# Patient Record
Sex: Female | Born: 1960 | Race: White | Hispanic: No | Smoking: Former smoker
Health system: Southern US, Community
[De-identification: ages and names within clinical notes are randomized; demographics above are authoritative.]

## PROBLEM LIST (undated history)

## (undated) ENCOUNTER — Emergency Department (HOSPITAL_COMMUNITY): Admission: EM | Payer: BLUE CROSS/BLUE SHIELD | Source: Home / Self Care

## (undated) ENCOUNTER — Emergency Department (HOSPITAL_COMMUNITY): Admission: EM | Payer: BC Managed Care – PPO

## (undated) DIAGNOSIS — I639 Cerebral infarction, unspecified: Secondary | ICD-10-CM

## (undated) DIAGNOSIS — IMO0002 Reserved for concepts with insufficient information to code with codable children: Secondary | ICD-10-CM

## (undated) DIAGNOSIS — R32 Unspecified urinary incontinence: Secondary | ICD-10-CM

## (undated) DIAGNOSIS — Z8679 Personal history of other diseases of the circulatory system: Secondary | ICD-10-CM

## (undated) DIAGNOSIS — D649 Anemia, unspecified: Secondary | ICD-10-CM

## (undated) DIAGNOSIS — I10 Essential (primary) hypertension: Secondary | ICD-10-CM

## (undated) DIAGNOSIS — Z8709 Personal history of other diseases of the respiratory system: Secondary | ICD-10-CM

## (undated) DIAGNOSIS — I493 Ventricular premature depolarization: Secondary | ICD-10-CM

## (undated) DIAGNOSIS — E785 Hyperlipidemia, unspecified: Secondary | ICD-10-CM

## (undated) DIAGNOSIS — D369 Benign neoplasm, unspecified site: Secondary | ICD-10-CM

## (undated) DIAGNOSIS — J45909 Unspecified asthma, uncomplicated: Secondary | ICD-10-CM

## (undated) DIAGNOSIS — Z9889 Other specified postprocedural states: Secondary | ICD-10-CM

## (undated) DIAGNOSIS — R112 Nausea with vomiting, unspecified: Secondary | ICD-10-CM

## (undated) DIAGNOSIS — F419 Anxiety disorder, unspecified: Secondary | ICD-10-CM

## (undated) DIAGNOSIS — Z8616 Personal history of COVID-19: Secondary | ICD-10-CM

## (undated) DIAGNOSIS — N809 Endometriosis, unspecified: Secondary | ICD-10-CM

## (undated) DIAGNOSIS — Z5189 Encounter for other specified aftercare: Secondary | ICD-10-CM

## (undated) HISTORY — DX: Essential (primary) hypertension: I10

## (undated) HISTORY — DX: Personal history of COVID-19: Z86.16

## (undated) HISTORY — PX: BREAST SURGERY: SHX581

## (undated) HISTORY — DX: Hyperlipidemia, unspecified: E78.5

## (undated) HISTORY — DX: Ventricular premature depolarization: I49.3

## (undated) HISTORY — DX: Personal history of other diseases of the respiratory system: Z87.09

## (undated) HISTORY — PX: HERNIA REPAIR: SHX51

## (undated) HISTORY — DX: Reserved for concepts with insufficient information to code with codable children: IMO0002

## (undated) HISTORY — DX: Endometriosis, unspecified: N80.9

## (undated) HISTORY — DX: Cerebral infarction, unspecified: I63.9

## (undated) HISTORY — PX: OTHER SURGICAL HISTORY: SHX169

## (undated) HISTORY — PX: REDUCTION MAMMAPLASTY: SUR839

## (undated) HISTORY — DX: Encounter for other specified aftercare: Z51.89

## (undated) HISTORY — DX: Anemia, unspecified: D64.9

## (undated) HISTORY — DX: Unspecified urinary incontinence: R32

## (undated) HISTORY — DX: Benign neoplasm, unspecified site: D36.9

## (undated) HISTORY — PX: EPIDURAL BLOCK INJECTION: SHX1516

## (undated) HISTORY — PX: SPINE SURGERY: SHX786

## (undated) HISTORY — DX: Personal history of other diseases of the circulatory system: Z86.79

---

## 1998-07-13 ENCOUNTER — Inpatient Hospital Stay (HOSPITAL_COMMUNITY): Admission: AD | Admit: 1998-07-13 | Discharge: 1998-07-13 | Payer: Self-pay | Admitting: Obstetrics and Gynecology

## 1998-09-02 ENCOUNTER — Inpatient Hospital Stay (HOSPITAL_COMMUNITY): Admission: AD | Admit: 1998-09-02 | Discharge: 1998-09-05 | Payer: Self-pay | Admitting: *Deleted

## 1998-09-06 ENCOUNTER — Encounter (HOSPITAL_COMMUNITY): Admission: RE | Admit: 1998-09-06 | Discharge: 1998-10-20 | Payer: Self-pay | Admitting: Obstetrics and Gynecology

## 1998-10-14 ENCOUNTER — Other Ambulatory Visit: Admission: RE | Admit: 1998-10-14 | Discharge: 1998-10-14 | Payer: Self-pay | Admitting: Obstetrics and Gynecology

## 1999-01-24 DIAGNOSIS — Z5189 Encounter for other specified aftercare: Secondary | ICD-10-CM

## 1999-01-24 HISTORY — DX: Encounter for other specified aftercare: Z51.89

## 1999-02-13 ENCOUNTER — Ambulatory Visit (HOSPITAL_COMMUNITY): Admission: RE | Admit: 1999-02-13 | Discharge: 1999-02-13 | Payer: Self-pay | Admitting: Obstetrics and Gynecology

## 1999-02-13 ENCOUNTER — Encounter: Payer: Self-pay | Admitting: Obstetrics and Gynecology

## 1999-05-03 ENCOUNTER — Ambulatory Visit (HOSPITAL_COMMUNITY): Admission: RE | Admit: 1999-05-03 | Discharge: 1999-05-03 | Payer: Self-pay | Admitting: Gastroenterology

## 1999-10-10 ENCOUNTER — Other Ambulatory Visit: Admission: RE | Admit: 1999-10-10 | Discharge: 1999-10-10 | Payer: Self-pay | Admitting: Obstetrics and Gynecology

## 1999-12-25 HISTORY — PX: ABDOMINAL HYSTERECTOMY: SHX81

## 1999-12-27 ENCOUNTER — Encounter (INDEPENDENT_AMBULATORY_CARE_PROVIDER_SITE_OTHER): Payer: Self-pay

## 1999-12-27 ENCOUNTER — Inpatient Hospital Stay (HOSPITAL_COMMUNITY): Admission: RE | Admit: 1999-12-27 | Discharge: 2000-01-02 | Payer: Self-pay | Admitting: Obstetrics and Gynecology

## 1999-12-29 ENCOUNTER — Encounter: Payer: Self-pay | Admitting: Obstetrics and Gynecology

## 1999-12-30 ENCOUNTER — Encounter: Payer: Self-pay | Admitting: *Deleted

## 2000-08-28 ENCOUNTER — Ambulatory Visit (HOSPITAL_COMMUNITY): Admission: RE | Admit: 2000-08-28 | Discharge: 2000-08-28 | Payer: Self-pay | Admitting: Obstetrics and Gynecology

## 2000-08-28 ENCOUNTER — Encounter: Payer: Self-pay | Admitting: Obstetrics and Gynecology

## 2001-01-31 ENCOUNTER — Ambulatory Visit (HOSPITAL_COMMUNITY): Admission: RE | Admit: 2001-01-31 | Discharge: 2001-01-31 | Payer: Self-pay | Admitting: Gastroenterology

## 2001-12-24 HISTORY — PX: BLADDER SUSPENSION: SHX72

## 2002-12-22 ENCOUNTER — Ambulatory Visit (HOSPITAL_BASED_OUTPATIENT_CLINIC_OR_DEPARTMENT_OTHER): Admission: RE | Admit: 2002-12-22 | Discharge: 2002-12-22 | Payer: Self-pay | Admitting: Surgery

## 2003-02-24 ENCOUNTER — Other Ambulatory Visit: Admission: RE | Admit: 2003-02-24 | Discharge: 2003-02-24 | Payer: Self-pay | Admitting: Obstetrics and Gynecology

## 2003-08-25 ENCOUNTER — Ambulatory Visit (HOSPITAL_COMMUNITY): Admission: RE | Admit: 2003-08-25 | Discharge: 2003-08-25 | Payer: Self-pay | Admitting: Gastroenterology

## 2003-10-27 ENCOUNTER — Encounter: Admission: RE | Admit: 2003-10-27 | Discharge: 2003-10-27 | Payer: Self-pay | Admitting: Neurosurgery

## 2003-11-11 ENCOUNTER — Encounter: Admission: RE | Admit: 2003-11-11 | Discharge: 2003-11-11 | Payer: Self-pay | Admitting: Neurosurgery

## 2004-04-20 ENCOUNTER — Encounter: Admission: RE | Admit: 2004-04-20 | Discharge: 2004-04-20 | Payer: Self-pay | Admitting: Neurosurgery

## 2004-05-10 ENCOUNTER — Encounter: Admission: RE | Admit: 2004-05-10 | Discharge: 2004-05-10 | Payer: Self-pay | Admitting: Neurosurgery

## 2004-05-12 ENCOUNTER — Other Ambulatory Visit: Admission: RE | Admit: 2004-05-12 | Discharge: 2004-05-12 | Payer: Self-pay | Admitting: Obstetrics and Gynecology

## 2004-12-31 ENCOUNTER — Emergency Department (HOSPITAL_COMMUNITY): Admission: EM | Admit: 2004-12-31 | Discharge: 2004-12-31 | Payer: Self-pay | Admitting: Emergency Medicine

## 2005-02-19 ENCOUNTER — Ambulatory Visit (HOSPITAL_COMMUNITY): Admission: RE | Admit: 2005-02-19 | Discharge: 2005-02-19 | Payer: Self-pay | Admitting: General Surgery

## 2005-02-19 ENCOUNTER — Encounter (INDEPENDENT_AMBULATORY_CARE_PROVIDER_SITE_OTHER): Payer: Self-pay | Admitting: *Deleted

## 2005-02-20 ENCOUNTER — Observation Stay (HOSPITAL_COMMUNITY): Admission: RE | Admit: 2005-02-20 | Discharge: 2005-02-20 | Payer: Self-pay | Admitting: General Surgery

## 2005-09-07 ENCOUNTER — Encounter: Payer: Self-pay | Admitting: *Deleted

## 2005-10-24 ENCOUNTER — Encounter: Admission: RE | Admit: 2005-10-24 | Discharge: 2005-10-24 | Payer: Self-pay | Admitting: *Deleted

## 2008-01-29 ENCOUNTER — Encounter: Admission: RE | Admit: 2008-01-29 | Discharge: 2008-01-29 | Payer: Self-pay | Admitting: Surgery

## 2008-02-02 ENCOUNTER — Encounter: Admission: RE | Admit: 2008-02-02 | Discharge: 2008-02-02 | Payer: Self-pay | Admitting: Obstetrics and Gynecology

## 2008-03-19 ENCOUNTER — Encounter: Admission: RE | Admit: 2008-03-19 | Discharge: 2008-03-19 | Payer: Self-pay | Admitting: Neurosurgery

## 2008-03-25 ENCOUNTER — Encounter: Admission: RE | Admit: 2008-03-25 | Discharge: 2008-03-25 | Payer: Self-pay | Admitting: Neurosurgery

## 2008-04-20 ENCOUNTER — Encounter: Admission: RE | Admit: 2008-04-20 | Discharge: 2008-04-20 | Payer: Self-pay | Admitting: Neurosurgery

## 2008-05-06 ENCOUNTER — Encounter: Admission: RE | Admit: 2008-05-06 | Discharge: 2008-05-06 | Payer: Self-pay | Admitting: Neurosurgery

## 2008-06-10 ENCOUNTER — Ambulatory Visit (HOSPITAL_COMMUNITY): Admission: RE | Admit: 2008-06-10 | Discharge: 2008-06-10 | Payer: Self-pay | Admitting: Neurosurgery

## 2008-06-11 ENCOUNTER — Ambulatory Visit (HOSPITAL_COMMUNITY): Admission: RE | Admit: 2008-06-11 | Discharge: 2008-06-12 | Payer: Self-pay | Admitting: Neurosurgery

## 2008-09-09 ENCOUNTER — Encounter: Admission: RE | Admit: 2008-09-09 | Discharge: 2008-09-09 | Payer: Self-pay | Admitting: Neurosurgery

## 2008-09-29 ENCOUNTER — Inpatient Hospital Stay (HOSPITAL_COMMUNITY): Admission: RE | Admit: 2008-09-29 | Discharge: 2008-10-03 | Payer: Self-pay | Admitting: Neurosurgery

## 2008-12-24 HISTORY — PX: LUMBAR FUSION: SHX111

## 2009-01-28 ENCOUNTER — Encounter: Admission: RE | Admit: 2009-01-28 | Discharge: 2009-01-28 | Payer: Self-pay | Admitting: Neurosurgery

## 2009-01-31 ENCOUNTER — Encounter: Admission: RE | Admit: 2009-01-31 | Discharge: 2009-01-31 | Payer: Self-pay | Admitting: Obstetrics and Gynecology

## 2009-02-02 ENCOUNTER — Encounter: Admission: RE | Admit: 2009-02-02 | Discharge: 2009-02-02 | Payer: Self-pay | Admitting: Neurosurgery

## 2009-02-25 ENCOUNTER — Encounter: Admission: RE | Admit: 2009-02-25 | Discharge: 2009-02-25 | Payer: Self-pay | Admitting: Neurosurgery

## 2009-04-28 ENCOUNTER — Encounter: Admission: RE | Admit: 2009-04-28 | Discharge: 2009-04-28 | Payer: Self-pay | Admitting: Specialist

## 2010-02-20 ENCOUNTER — Encounter: Admission: RE | Admit: 2010-02-20 | Discharge: 2010-02-20 | Payer: Self-pay | Admitting: Neurosurgery

## 2010-02-20 ENCOUNTER — Encounter: Admission: RE | Admit: 2010-02-20 | Discharge: 2010-02-20 | Payer: Self-pay | Admitting: Obstetrics and Gynecology

## 2010-03-30 ENCOUNTER — Encounter: Admission: RE | Admit: 2010-03-30 | Discharge: 2010-03-30 | Payer: Self-pay | Admitting: Neurosurgery

## 2010-05-05 ENCOUNTER — Encounter: Admission: RE | Admit: 2010-05-05 | Discharge: 2010-05-05 | Payer: Self-pay | Admitting: Neurosurgery

## 2010-09-11 ENCOUNTER — Encounter: Admission: RE | Admit: 2010-09-11 | Discharge: 2010-09-11 | Payer: Self-pay | Admitting: Neurosurgery

## 2010-11-20 ENCOUNTER — Encounter: Admission: RE | Admit: 2010-11-20 | Discharge: 2010-11-20 | Payer: Self-pay | Admitting: Neurosurgery

## 2011-01-01 ENCOUNTER — Ambulatory Visit: Payer: Self-pay | Admitting: Cardiology

## 2011-01-13 ENCOUNTER — Encounter: Payer: Self-pay | Admitting: Neurosurgery

## 2011-01-14 ENCOUNTER — Encounter: Payer: Self-pay | Admitting: Neurosurgery

## 2011-01-25 ENCOUNTER — Other Ambulatory Visit (HOSPITAL_COMMUNITY): Payer: Self-pay | Admitting: Obstetrics and Gynecology

## 2011-01-25 DIAGNOSIS — Z1239 Encounter for other screening for malignant neoplasm of breast: Secondary | ICD-10-CM

## 2011-01-25 DIAGNOSIS — Z1231 Encounter for screening mammogram for malignant neoplasm of breast: Secondary | ICD-10-CM

## 2011-02-22 ENCOUNTER — Other Ambulatory Visit: Payer: Self-pay | Admitting: Obstetrics and Gynecology

## 2011-02-22 ENCOUNTER — Ambulatory Visit (HOSPITAL_COMMUNITY): Admission: RE | Admit: 2011-02-22 | Payer: BC Managed Care – PPO | Source: Ambulatory Visit

## 2011-02-22 ENCOUNTER — Ambulatory Visit
Admission: RE | Admit: 2011-02-22 | Discharge: 2011-02-22 | Disposition: A | Discharge status: Home / Self Care | Payer: BC Managed Care – PPO | Source: Ambulatory Visit | Attending: Obstetrics and Gynecology | Admitting: Obstetrics and Gynecology

## 2011-02-22 DIAGNOSIS — Z139 Encounter for screening, unspecified: Secondary | ICD-10-CM

## 2011-02-22 DIAGNOSIS — Z1231 Encounter for screening mammogram for malignant neoplasm of breast: Secondary | ICD-10-CM

## 2011-03-19 ENCOUNTER — Other Ambulatory Visit: Payer: Self-pay | Admitting: Otolaryngology

## 2011-03-19 DIAGNOSIS — H9202 Otalgia, left ear: Secondary | ICD-10-CM

## 2011-04-02 ENCOUNTER — Other Ambulatory Visit: Payer: Self-pay | Admitting: Neurosurgery

## 2011-04-02 DIAGNOSIS — M47816 Spondylosis without myelopathy or radiculopathy, lumbar region: Secondary | ICD-10-CM

## 2011-04-09 ENCOUNTER — Other Ambulatory Visit: Payer: Self-pay | Admitting: Neurosurgery

## 2011-04-09 ENCOUNTER — Ambulatory Visit
Admission: RE | Admit: 2011-04-09 | Discharge: 2011-04-09 | Disposition: A | Discharge status: Home / Self Care | Payer: BC Managed Care – PPO | Source: Ambulatory Visit | Attending: Neurosurgery | Admitting: Neurosurgery

## 2011-04-09 DIAGNOSIS — M47816 Spondylosis without myelopathy or radiculopathy, lumbar region: Secondary | ICD-10-CM

## 2011-05-08 NOTE — Op Note (Signed)
NAME:  [name], [name] NO.:  [id number]   MEDICAL RECORD NO.:  [id number]          PATIENT TYPE:  OIB   LOCATION:  3018                         FACILITY:  [facility]   PHYSICIAN:  Payton Doughty, M.D.      DATE OF BIRTH:  March 18, 1961   DATE OF PROCEDURE:  06/11/2008  DATE OF DISCHARGE:                               OPERATIVE REPORT   PREOPERATIVE DIAGNOSIS:  Foraminal disk on the left side, L5-S1.   POSTOPERATIVE DIAGNOSIS:  Foraminal disk on the left side, L5-S1.   PROCEDURE:  Left L5-S1 foraminal diskectomy.   SURGEON:  Payton Doughty, MD   ANESTHESIA:  General endotracheal.   PREPARATION:  Prepped and draped with alcohol wipe.   COMPLICATIONS:  None.   ASSISTANT:  Environmental consultant.   BODY OF TEXT:  A 50 year old girl with left L5 radiculopathy and a  foraminal disk on the left side at L5-S1 taken to operative smoothly  sized and intubated, placed prone on the operating table.  Pressure  points padded following shave, prepped and draped in usual sterile  fashion.  Skin was infiltrated with 1% lidocaine with 100,000  epinephrine.  The skin was incised over the lamina of L5.  The lamina of  L5 was dissected free in subperiosteal plane out to the lateral margin  of the facet joint.  Intraoperative x-ray confirmed correctness level  filling to the pars and the superior portion of the L5-S1 facet joint.  The lateral epidural space was accessed.  The L5 root was identified and  the dorsal ganglion identified.  Medial dissection revealed a fragmented  disk, which was compressing the dorsal root ganglion.  The ganglion  itself was quite indurated and red.  The disk fragment was removed  without difficulty and the disk space explored.  All graspable fragments  removed.  Wound was irrigated.  Hemostasis assured.  The nerve root  explored in all quadrants and found to be free.  It was covered with  Depo-Medrol soaked fat.  Successive layers of 0 Vicryl, 2-0 Vicryl, and  4-0 Vicryl  were used to close.  Benzoin and Steri-Strips were placed and  made occlusive Telfa and OpSite.  The patient returned to recovery room  in good condition.           ______________________________  Payton Doughty, M.D.     MWR/MEDQ  D:  06/11/2008  T:  06/12/2008  Job:  [id number]

## 2011-05-08 NOTE — H&P (Signed)
[name], [name] NO.:  [id number]   MEDICAL RECORD NO.:  [id number]          PATIENT TYPE:  INP   LOCATION:  2899                         FACILITY:  [facility]   PHYSICIAN:  Payton Doughty, M.D.      DATE OF BIRTH:  [birth year]   DATE OF ADMISSION:  09/29/2008  DATE OF DISCHARGE:                              HISTORY & PHYSICAL   ADMITTING DIAGNOSIS:  Herniated disk, L5-S1 in extreme lateral position.   BODY OF TEXT:  This is a now 50 year old right-handed white girl who had  a left-sided L5-S1 diskectomy in late spring, done very well and had the  sudden onset of left leg pain, it was different than before.  MR  demonstrates a disk in new position at the extreme lateral position on  the left side and she is now admitted for decompression and fusion.   MEDICAL HISTORY:  Remarkable for hypercholesterolemia and anxiety.  She  is on Prozac, antihypertensive, and Lyrica.   ALLERGIES:  She is allergic to PENICILLIN.   SURGICAL HISTORY:  Hysterectomy, which was complicated by some  postoperative hemorrhaging for which she had abdominal exploration.  She  has also had lumbar diskectomies noted before.   SOCIAL HISTORY:  She does not smoke.  Drinks on social basis and  participates in tennis.  She is a Futures trader.   FAMILY HISTORY:  Her mom is 77 and dad is 76, both are in good health.   REVIEW OF SYSTEMS:  GENERAL:  Remarkable for glasses, heart murmur,  hypercholesterolemia, leg pain, endometriosis, leg weakness, back pain,  and large transfusion associated with her hysterectomy.  HEENT:  Normal  limits.  NECK:  She has reasonable range of motion in her neck.  CHEST:  Clear.  CARDIAC:  1-2/6 systolic ejection murmur.  ABDOMEN:  Nontender.  No hepatosplenomegaly.  EXTREMITIES:  Without clubbing or cyanosis.  GU:  Deferred.  Peripheral pulses are good.  NEUROLOGIC:  She is awake,  alert, and oriented.  Cranial nerves are intact.  Motor exam shows 5/5  strength  throughout the upper and lower extremities.  Sensory  dysesthesias described in her left L5 distribution.  Reflexes are one at  the knees, one at the ankles.  Straight leg raise is extraordinarily  positive on the left side.   MR results have been reviewed above.   Consideration was given to an anterior approach, however, because of the  extensive scarring, her abdomen was not felt to be prudent.  The plan  therefore is for a laminectomy, diskectomy, posterior lumbar interbody  fusion with Ray threaded fusion cages and posterolateral arthrodesis.  The risks and benefits have been discussed with her and she wish to  proceed.           ______________________________  Payton Doughty, M.D.     MWR/MEDQ  D:  09/29/2008  T:  09/29/2008  Job:  [id number]

## 2011-05-08 NOTE — Op Note (Signed)
NAMEMarland Kitchen  [name], [name] NO.:  [id number]   MEDICAL RECORD NO.:  [id number]          PATIENT TYPE:  INP   LOCATION:  3041                         FACILITY:  [facility]   PHYSICIAN:  Payton Doughty, M.D.      DATE OF BIRTH:  1961-05-20   DATE OF PROCEDURE:  09/29/2008  DATE OF DISCHARGE:                               OPERATIVE REPORT   PREOPERATIVE DIAGNOSIS:  Extreme lateral disk at L5-S1.   POSTOPERATIVE DIAGNOSIS:  Extreme lateral disk at L5-S1.   OPERATIVE PROCEDURES:  1. L5-S1 laminectomy and diskectomy.  2. Posterior lumbar body fusion with Ray threaded fusion cages.  3. Posterolateral arthrodesis with BMP on Actifuse.   SURGEON:  Payton Doughty, MD   ANESTHESIA:  General endotracheal.   PREPARATION:  Prepped and draped with alcohol wipe.   COMPLICATIONS:  None.   ASSISTANT:  Cristi Loron, MD   BODY OF TEXT:  A 50 year old girl with recurrent disk in the extreme  lateral position on the left side at L5-S1, taken to the operating room  and smoothly anesthetized and intubated, placed prone on the operating  table.  Following shave, prep and drape in the usual sterile fashion,  the skin was incised from mid L4 to mid S1 and the lamina and transverse  process of L5-S1 were exposed bilaterally in subperiosteal plane.  Intraoperative x-ray confirmed correctness of the level.  The pars  interarticularis, lamina, and inferior facet of L5 and superior facet of  S1 were removed bilaterally using a high-speed drill.  This allowed  access to the L5-S1 disk space.  Disk space was opened on the left side  and diskectomy carried out.  Working then laterally through the annular  defect, the disk material that extruded laterally was pushed back into  the disk space and recovered through the dorsal annular incision.  This  allowed decompression of the L5 root in its lateral position.  Diskectomy was then carried out on the right side and the Ray cages 12 x  21 mm were  placed bilaterally at L5-S1.  Intraoperative x-ray showed  good placement of cages.  They were packed with bone graft harvested  from the facet joints and capped.  Transverse process and sacral ala  were decorticated with a high-speed drill and packed with BMP on the  Actifuse extender matrix.  Final films showed good placement of cages.  Successive layers of 0 Vicryl, 2-0 Vicryl and 3-0 nylon were used to  close.  Betadine and Telfa dressing was applied and the patient returned  to recovery room in good condition.    .           ______________________________  Payton Doughty, M.D.     MWR/MEDQ  D:  09/29/2008  T:  09/30/2008  Job:  [id number]

## 2011-05-08 NOTE — H&P (Signed)
NAME:  [name], [name] NO.:  [id number]   MEDICAL RECORD NO.:  [id number]          PATIENT TYPE:  OIB   LOCATION:  3018                         FACILITY:  [facility]   PHYSICIAN:  Payton Doughty, M.D.      DATE OF BIRTH:  29-Feb-1961   DATE OF ADMISSION:  06/11/2008  DATE OF DISCHARGE:                              HISTORY & PHYSICAL   ADMITTING DIAGNOSES:  1. Left L5-S1 foraminal herniated disk.  2. Left L5 radiculopathy.   This is a 50 year old right-handed white girl with a several month  history of severe left lower extremity pain.  She has had a round of  epidural steroids.  MRI showed a foraminal disk on the left side last  week and she had a marked increase in her pain and began having some  dorsiflexion weakness.  Her PMR shows persistent foraminal disk on the  left side at L5-S1 with compression left L5 root at the dorsal root  ganglion and she was admitted for diskectomy.   MEDICAL HISTORY:  Remarkable.  She has hypercholesterolemia, some  anxiety.   Uses Wellbutrin, Xanax, calcium, estradiol, and baby aspirin.  She has  had some hysterectomy and some postoperative hemorrhage that  necessitated abdominal exploration.   ALLERGIES:  She is allergic to PENICILLIN and preparation H.   SOCIAL HISTORY:  Does not smoke.  Drinks on social basis, participates  in tennis and running.  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   FAMILY HISTORY:  Mother is 45 and father is 46.  Both are in good  condition.  Father has hypertension.   REVIEW OF SYSTEMS:  Remarkable for glasses, heart murmur,  hypercholesterolemia, leg pain when she is walking, endometriosis, leg  weakness, back pain.   PHYSICAL EXAMINATION:  HEENT:  Normal limits.  NECK:  She has good range of motion.  CHEST: Clear.  CARDIAC:  1-2/6 systolic ejection murmur.  ABDOMEN:  Nontender.  No hepatosplenomegaly.  EXTREMITIES:  Without clubbing or cyanosis.  Peripheral pulses are good.  GU:   Deferred.  NEUROLOGIC:  She is awake, alert, and oriented.  Cranial nerves appear  functioning normally.  Motor exam shows 5/5 strength throughout the  upper and lower extremities save for dorsiflexion in the left foot, they  are 5-/5.  Ankle everters are also weak.  Sensory deficit in the left L5  distribution.  Reflexes are 2 at the knees, 2 at the right ankle, 1 at  the left.  Straight leg raise is extremely positive on the left.   MRI results have been reviewed above.   CLINICAL IMPRESSION:  Foraminal disk with a left L5 radiculopathy.   PLAN:  Foraminal diskectomy on the left side.  The risks and benefits  have been discussed, and she wishes to proceed.           ______________________________  Payton Doughty, M.D.     MWR/MEDQ  D:  06/11/2008  T:  06/12/2008  Job:  [id number]

## 2011-05-11 NOTE — Op Note (Signed)
NAME:  [name], [name]                   ACCOUNT NO.:  [id number]   MEDICAL RECORD NO.:  [id number]                   PATIENT TYPE:  AMB   LOCATION:  DSC                                  FACILITY:  [facility]   PHYSICIAN:  Thornton Park. Daphine Deutscher, M.D.             DATE OF BIRTH:  10/25/1961   DATE OF PROCEDURE:  12/22/2002  DATE OF DISCHARGE:                                 OPERATIVE REPORT   PREOPERATIVE DIAGNOSIS:  Umbilical hernia.   POSTOPERATIVE DIAGNOSIS:  Umbilical hernia.   OPERATION PERFORMED:  Vest over pants umbilical herniorrhaphy with Marlex  mesh.   SURGEON:  Thornton Park. Daphine Deutscher, M.D.   ANESTHESIA:  General by LMA.   INDICATIONS FOR PROCEDURE:  The patient is a 50 year old lady with a prior  history of laparoscopic intervention to the umbilical region multiple times  in the past.  Within the last two years, she has had a lower midline  incision and after that procedure was noted to have a bulge in the  umbilicus.  This could be seen to displace basically the bottom of the  umbilicus and was about 1 cm in diameter with Valsalva.   DESCRIPTION OF PROCEDURE:  In room 3 at [facility] the  patient was in supine position and was given some intravenous ciprofloxacin  as there is an allergy to penicillin.  The abdomen was prepped with Betadine  and draped sterilely.  Prior incisions included a longitudinal incision  going up into the umbilicus and there appeared to be multiple transverse  incisions and scarring.  An ellipse of skin was excised that incorporated  the bulk of this scar tissue.  I then dissected carefully using scissors  cephalad defining the margins medially or laterally around this hernia  defect.  I then began excising it and actually opened the hernia sac and  dissected around, excising the sac but leaving a little sac in the base of  the umbilicus to maintain viability of umbilical skin.  When this had been  completed, I was left with a  defect of just a little over a centimeter in  diameter.  I was unable to insert my finger.  I did lift it up and inspected  the superior and inferior margins and did not see any evidence of any bowel  that was tucked in it.  I did take some fat off of the hernia itself and  allowed that to return into the abdomen.  The defect appeared small and  there was no way that any of the Marlex  Gore-Tex mesh patches could be  inserted through this small defect.  A vest over pants repair was then  performed using a piece of Marlex mesh on the superior flap and using  horizontal mattress sutures coming through the superior fascia through the  mesh then superior fascia and inferior fascia and then back through the  inferior fascia and then backup through the superior  fascia and through  mesh.  Two such sutures were placed and then tied and this completely  obliterated this defect.  Seven throw square knots using 0 Prolene was used  and with the excess trimmed so that there was no prickly ends on the knot  and these were laid down laterally.  The mesh actually laid nicely but I  tacked it superiorly and inferiorly with a 5-0 Monocryl to keep it lying in  that fashion.  I did trim it back also so there was not excess mesh in this  area.  Inferiorly, I freed some of the scar to allow that skin to be a  little more mobile.  I tacked with the umbilical skin down to the fascia  with a single 5-0 Monocryl.  Closure of the wound was then effected after  injecting with Marcaine and irrigating was with 5-0 Monocryl subcuticular  sutures in an interrupted fashion.  Benzoin and Steri-Strips were applied on  the skin.  Half inch Steri-Strips were applied.  A cotton ball was placed  down in the umbilicus.  A sterile dressing was applied and then an abdominal  binder.  The patient will be given Vicodin to take as needed for pain and  will be followed up in the office in the next one to two weeks or as needed.     FINAL DIAGNOSIS:  Umbilical hernia, status post repair with mesh.                                                Thornton Park Daphine Deutscher, M.D.    MBM/MEDQ  D:  12/22/2002  T:  12/22/2002  Job:  [id number]   cc:   Velora Heckler, M.D.  [address] [place]  [place] [address]  Fax: [phone]

## 2011-05-11 NOTE — Op Note (Signed)
[facility]  Patient:    MARK BENECKE                 MRN: [id number] Proc. Date: 12/28/99 Adm. Date:  [id number] Attending:  Rhina Brackett                           Operative Report  PREOPERATIVE DIAGNOSIS:  Posthysterectomy bleeding.  POSTOPERATIVE DIAGNOSIS:  Posthysterectomy bleeding.  OPERATION:  Laparotomy, removal of clot and resuturing of vaginal cuff.  SURGEON:  Duke Salvia. Marcelle Overlie, M.D.  ANESTHESIA:  General endotracheal.  COMPLICATIONS:  None.  URINE OUTPUT:  150 cc, clear.  BLOOD REPLACEMENT:  Four units packed red blood cells intraoperatively.  DESCRIPTION OF PROCEDURE:  The patient was taken to the operating room with general endotracheal started.  She was in the process of receiving four units of packed red blood cells.  She was prepped and draped and was given Cefotan 1 gm IV and clear urine output as mentioned was noted preop.  After the incision was prepped and draped, the clips were removed.  The fascial suture was removed.  We carefully explored the subcutaneous layers, the perirectus muscle and subfascial areas showed no evidence of any bleeding.  On entering the peritoneum there was a large amount of clot noted.  Estimated blood loss was listed as 2000 cc including all clot.  Once the clot was removed, a retractor was positioned.  Irrigation was carried ut initially to allow for a better view.  After the retractor was positioned, the bowels were packed superiorly out of the field.  The clot had emanated from the  area of the cuff and once the clot was removed, all major pedicles were inspected carefully.  This was done in a systematic fashion starting at the utero-ovarian  pedicle, the right ovary appeared to be normal.  There was no bleeding from this pedicle all the way down the side wall on that side looked normal.  The left IP  ligament was carefully inspected, was hemostatic but a Kelly  clamp was placed and an additional free tie was placed around the left IP ligament to secure it further. The course of the ureter was retraced and was noted to be well out of the operative site as it was yesterday.  The pelvic sidewall showed no evidence of oozing. There was some oozing at the cuff but no brisk bleeding.  This area was irrigated and  inspected carefully since it was felt that the bleeding had come from the cuff area.  The entire cuff was resutured with interrupted 2-0 Monocryl sutures. These were held temporarily to allow for a better view of the bladder flap area which was irrigated and inspected and noted to be hemostatic.  Further, the space if Retzius was examined and noted to be hemostatic.  Copious irrigation was then carried out with the patient in reverse Trendelenburg position to suction the old blood and  clot from the gutters.  The patient was then further observed over the course of five to 10 minutes, looking at the cuff and there was no further bleeding noted. When this was noted to be hemostatic, all instruments were removed along with correct sponge count x 2 and instrument count prior to closure.  Monocryl 2-0 was used to approximate the rectus muscles, fascia closed from laterally to midline  with 0 PDS suture.  The subcutaneous area was hemostatic, was irrigated, again noted to  be hemostatic.  Clips and Steri-Strips were used on the skin.  She did  receive Cefotan 1 gm IV preoperatively and will be followed in AICU with labs obtained when she gets to the recovery room. DD:  12/28/99 TD:  12/29/99 Job: [id number]

## 2011-05-11 NOTE — Op Note (Signed)
NAMELANDI, [name]             ACCOUNT NO.:  [id number]   MEDICAL RECORD NO.:  [id number]          PATIENT TYPE:  AMB   LOCATION:  DAY                          FACILITY:  [facility]   PHYSICIAN:  Timothy E. Earlene Plater, M.D. DATE OF BIRTH:  Oct 13, 1961   DATE OF PROCEDURE:  02/19/2005  DATE OF DISCHARGE:                                 OPERATIVE REPORT   PREOPERATIVE DIAGNOSIS:  Postoperative bleeding.   POSTOPERATIVE DIAGNOSIS:  Postoperative bleeding.   OPERATION/PROCEDURE:  1.  Examination under anesthesia.  2.  Oversew hemorrhoidal area for postoperative bleeding.   SURGEON:  Timothy E. Earlene Plater, M.D.   ANESTHESIA:  General, Dr. Quentin Cornwall. Den[place].   INDICATIONS:  Mrs. 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  here at [facility].  She made good progress through the recovery room in  the day hospital and was discharged home.  At a period at home, she was able  to take liquids, a bit of food and was able to void her urine and was  feeling what we would expect normal for postoperative until like around  between 6 and 7 p.m. when she had rectal pain and fullness.  She went to the  bathroom and voided some old blood with a few clots.  She then went to the  bathroom two more times with both old and new blood.  I have talked to her  husband, Dr. Darnell Level, and I suggested that we bring her immediately to  the hospital for examination.  He agreed.  She was delivered to the  emergency room and jointly taken to the holding area of the operating room.  Dr. Council Mechanic took over her resuscitation though she stable, alert and vital  signs were quite good.  Blood was drawn.  Permit was signed.   DESCRIPTION OF PROCEDURE:  She was taken to the operating room.  General  endotracheal anesthesia administered and her vital signs remained stable and  normal.  The blood was done and immediately returned showing a  normal  hemoglobin, protime and PTT.  She was gently placed in the lithotomy  position.  The perianal layer was inspected.  There was some old blood on  the buttocks area and there was old blood in the bed clothing which had been  taken off.  The area was prepped and draped in the usual fashion.  I  inserted the proctoscope, suctioned away the old blood and gently advanced  the proctoscope beyond the PPH stapled anastomosis until all the old blood  was suctioned away and irrigant was clear.  I then removed the proctoscope  and placed the standard operating anoscope and inspected the distal rectum  entirely with good lighting and good vision.  There was one small area of  oozing in the right posterior stapled anastomosis.  This was oversewn with  two individual sutures of 4-0 Vicryl.  This was well controlled.  Observed  this area for a full 20 minutes, exchanging clean, dry 4 x 4 gauzes every  five  minutes and no further areas were bleeding.  Her vital signs remained  good.  Her blood pressure remained good.  She was never hypotensive.  After  the 20-minute observation with no further findings, I placed a roll of  Gelfoam inside of Surgicel and placed that in the rectum.  The perianal area  was cleansed.  The vulvar area was  inspected.  There was no bleeding there.  A Foley catheter was placed and  that urine was clear.  She tolerated this well and remained stable.  She was  awakened and taken to the recovery room.  Full report was given to her  husband, Dr. Gerrit Friends.  She will be admitted for observation.      TED/MEDQ  D:  02/19/2005  T:  02/20/2005  Job:  [id number]

## 2011-05-11 NOTE — Op Note (Signed)
NAME:  [name], [name]                   ACCOUNT NO.:  [id number]   MEDICAL RECORD NO.:  [id number]                   PATIENT TYPE:  AMB   LOCATION:  ENDO                                 FACILITY:  [facility]   PHYSICIAN:  John C. Madilyn Fireman, M.D.                 DATE OF BIRTH:  October 06, 1961   DATE OF PROCEDURE:  08/25/2003  DATE OF DISCHARGE:                                 OPERATIVE REPORT   PROCEDURE PERFORMED:  Esophagogastroduodenoscopy with biopsy.   ENDOSCOPIST:  Barrie Folk, M.D.   INDICATIONS FOR PROCEDURE:  The patient is a 50 year old white female who  presents with a three to four week history of regurgitant sensation as well  as mild odynophagia and belching, somewhat atypical for reflux symptoms she  has experienced in the past and unrelieved by a one week course of proton  pump inhibitor.   DESCRIPTION OF PROCEDURE:  The patient was placed in the left lateral  decubitus position and placed on the pulse monitor with continuous low-flow  oxygen delivered by nasal cannula.  She was sedated with 100 mg of IV  Demerol and 10 mg of IV Versed.  The Olympus video endoscope was advanced  under direct vision into the oropharynx esophagus.  The esophagus was  straight and of normal caliber with the squamocolumnar line at 38 cm.  There  was a possible very small sliding hiatal hernia no more than 1 cm in length.  The squamocolumnar line was sharp with no erosions and no ring, stricture or  esophageal ulcer seen.  The proximal and middle esophagus likewise appeared  normal.  The stomach was entered and a small amount of liquid secretions was  suctioned from the fundus.  A retroflex view of the cardia was unremarkable.  The fundus, body, antrum and pylorus all appeared normal.  The duodenum was  entered and both the bulb and second portion were well inspected and  appeared to be within normal limits.  The scope was then withdrawn back into  the stomach and a CLO test obtained.  The  scope was then withdrawn and the  patient was returned to the recovery room in stable condition.  The patient  tolerated the procedure well.  There were no immediate complications.   IMPRESSION:  Possible minimal sliding hiatal hernia, otherwise normal study.    PLAN:  Await CLO test and treat for eradication of Helicobacter pylori if  positive, otherwise will try a prolonged course of proton pump inhibitor at  b.i.d. dosing as well as institute dietary and lifestyle modifications for  gastroesophageal reflux.                                               John C. Madilyn Fireman, M.D.    JCH/MEDQ  D:  08/25/2003  T:  08/25/2003  Job:  [id number]   cc:   Mosetta Putt, M.D.  [address] [place]  [place] [address]  Fax: [phone]   Velora Heckler, M.D.  [address] [place]  [place] [address]  Fax: [phone]

## 2011-05-11 NOTE — Discharge Summary (Signed)
NAMEMarland Kitchen  NIRVI, BOEHLER NO.:  [id number]   MEDICAL RECORD NO.:  [id number]          PATIENT TYPE:  INP   LOCATION:  3041                         FACILITY:  [facility]   PHYSICIAN:  Payton Doughty, M.D.      DATE OF BIRTH:  [birth year]   DATE OF ADMISSION:  09/29/2008  DATE OF DISCHARGE:  10/03/2008                               DISCHARGE SUMMARY   ADMITTING DIAGNOSIS:  Recurrent herniated disk, lateral position L5-S1.   DISCHARGE DIAGNOSIS:  Recurrent herniated disk, lateral position L5-S1.   OPERATIVE PROCEDURE:  L5-S1 diskectomy and fusion.   COMPLICATION:  None.   DISCHARGE STATUS:  Alive and well.   BODY OF TEXT:  This is a 50 year old right-handed white girl whose  history and physical is recounted in the chart.  She basically had a L5-  S1 disk done before, had a recurrence of pain.  MR showed a far-lateral  recurrence of her disk at L5-S1.  She was admitted for decompression and  fusion.  General exam was intact.  Neurologic exam was intact with a  left S1 radiculopathy.  She was taken to operating room, smoothly  anesthetized and intubated and underwent diskectomy and fusion.  Postoperatively, she is well, was up and about the first postoperative  day, had one fever, which resolved.  Second postoperative day, she was  also walk around, blood pressure was okay.  She was discharged home on  the third postoperative day with diminished leg pain and modest  incisional back pain.  She went home on Percocet.  Followup will be in  the [facility] office in a week for sutures.           ______________________________  Payton Doughty, M.D.     MWR/MEDQ  D:  12/07/2008  T:  12/07/2008  Job:  [id number]

## 2011-05-11 NOTE — Op Note (Signed)
[facility]  Patient:    [name]                 MRN: [id number] Proc. Date: 12/27/99 Adm. Date:  [id number] Attending:  Rhina Brackett                           Operative Report  PREOPERATIVE DIAGNOSIS:       Chronic pelvic pain, endometriosis, abnormal uterine bleeding.  POSTOPERATIVE DIAGNOSIS:      Chronic pelvic pain, endometriosis, abnormal uterine bleeding.  OPERATION:                    Total abdominal hysterectomy, left salpingo-oophorectomy.  SURGEON:                      Duke Salvia. Marcelle Overlie, M.D.  ASSISTANTWilley Blade, M.D.  ANESTHESIA:                   General endotracheal.  COMPLICATIONS:                None.  DRAINS:                       Foley catheter.  ESTIMATED BLOOD LOSS:         150 cc.  DESCRIPTION OF PROCEDURE:     The patient was taken to the operating room after an adequate level of general endotracheal was obtained with the patient in the supine position.  The abdomen was prepared and draped in the usual manner for sterile abdominal procedures.  The vagina prepped separately with Betadine and a Foley catheter in position.  Pfannenstiel incision was made into the old scar, which as carried down into the fascia, incised and extended transversely.  Rectus muscles divided in the midline.  The peritoneum entered superiorly without incident and  extended in a vertical manner.  The upper abdomen was explored and known to be palpably normal.  The appendix was small, very benign appearing and was not removed.  OConnor-OSullivan retractor was positioned.  The patient was placed in Trendelenburg, balanced packs superiorly out of the field.  The pelvic findings are as follows:  Uterus is normal size.  The right tube and  ovary were normal.  The cul-de-sac was free and clear.  The left tube and ovary had some active areas of endometriosis and some filmy and moderately  dense adhesions between the left ovary, tube, pelvic, and the pelvic side wall and colon. These were lysed in avascular plane.  Long Kelly clamps were then placed at each utero-ovarian pedicle.  The left round ligament was clamped, divided and suture  ligated with 0 Dexon.  The retroperitoneal space on that side was then developed. The course of the ureter was then traced, the left infundibulopelvic ligament was then isolated, clamped, divided, first free tied followed by suture ligature of 0 Monocryl.  This was done after assuring that the course of the ureter was well below.  The right round ligament was then clamped, divided, and suture ligated nd the right uteroovarian pedicle was clamped, divided, first free tied followed by suture ligature of Dexon, thus conserving the right tube.  The peritoneum was then carried around in midline.  The bladder was then advanced inferiorly with sharp  and blunt dissection below the cervical vaginal ______.  The uterine vasculature, pedicle then skeletonized on either side.  Care was taken to keep the course of the left pelvic ureter, in particular, under view until it was dissected laterally way from the uterus.  There was no trauma to the ureter.  The uterine and vasculature pedicles were then clamped, divided and suture ligated with 0 Monocryl.  In sequential fashion, staying close to the uterus and assuring that the bladder was well below the cardinal ligament, the uterosacral ligament, then cervical vaginal pedicles were clamped, divided and suture ligated with 0 Monocryl.  The specimen was excised and closed in a running lock 2-0 Monocryl suture.  The pelvis was irrigated with saline and aspirated.  All major pedicles were inspected and noted to be hemostatic.  The course of the ureter on either side was retraced and noted to be well below the operative site.  Prior to closure, sponge, needle and instrument counts reported  as correct x 2.  The rectus muscle was reapproximated with 3-0 Monocryl running suture.  The fascia closed from laterally to midline either side with 0 PDS running suture.  The subcutaneous fat was undermined and noted to be hemostatic after electrocautery and clips used on the skin with Steri-Strips and a pressure dressing.  She tolerated this well and went to the recovery room in good condition. Marcaine 100%, 15 cc was used to infiltrate the incision prior to the dressing.  DD:  12/27/99 TD:  12/27/99 Job: [id number]

## 2011-05-11 NOTE — Discharge Summary (Signed)
[facility]  Patient:    [name]                 MRN: [id number] Adm. Date:  [id number] Disc. Date: 01/02/00 Attending:  Rhina Brackett                           Discharge Summary  DISCHARGE DIAGNOSES:          1. Severe pelvic pain.                               2. Dysmenorrhea.                               3. Menorrhagia.                               4. Secondary to endometriosis.                               5. Total abdominal hysterectomy and left                                  salpingo-oophorectomy this admission.                               6. Posthysterectomy bleeding requiring transfusion                                  and reexploration with resuturing of the vaginal                                  cuff.  SUMMARY:                      Please see the admission H&P for details. Briefly, a 50 year old, gravida 4, para 3, with severe pelvic pain secondary to endometriosis and additionally menorrhagia that has been unresponsive to conservative measures, presents for a definitive hysterectomy.  HOSPITAL COURSE:              On January 3, under general anesthesia, the patient underwent TAHLSO with an estimated blood loss of 150 cc without complications. At 5 p.m. the same day, she had requested removal of her Foley due to bladder spasms which was done.  The physician on call was notified at 0300 of the first postoperative day stating that the nurse had observed that her abdomen was suddenly blue.  Her hemoglobin was checked at that point and was noted to be 5.8.  It was felt that she had some bleeding inside the abdominal wall perhaps a subfascial hematoma and another hemoglobin was ordered at 0600.  By this time, her hemoglobin was 5.  She had some diaphragmatic irritation, increased respirations, and was ery anxious at that point.  The decision was made to reexplore due to the drop in hemoglobin in this  patient who had peritoneal irritation symptoms.  She was retaken to the operating room while blood was  ordered.  She was given four units of packed red blood cells intraoperatively.  A large amount of clot was removed.  No active bleeders could be seen, but there was a small amount of oozing from the cuff. ll of the rest of the pedicles were hemostatic.  The area of the rectus muscles and subfascially was dry.  The vaginal cuff was resutured.  The clot was removed. he received four units intraoperatively.  She was recovered in AICU where her urine output remained good.  Her postoperative hemoglobin was 10.6.  On the first postoperative day, she was afebrile and her vital signs were stable.  Platelet count was 103,000, and hemoglobin was 10.  Due to some continued occasional desaturation on her pulse oximeter, chest x-ray was done which showed basilar atelectasis.  Incentive spirometry was emphasized.  On January 7, postoperative day #4, she was tolerating a regular diet, her hemoglobin was 9.8.  She had some bruising of the abdominal wall, but no evidence of cellulitis.  By January 8, postoperative day #5, she was ambulating without difficulty, ahd established normal bowel and urinary function.  She had a mild episode of diarrhea responding to Immodium and by January 9, postoperative day #6, she was ready for discharge with the clips removed.  LABORATORY DATA:              Admission UA was negative.  Preoperative hemoglobin was 12.9, postoperative immediate 10.6.  The remainder of the values listed as above.  At the time of discharge was 9.8, that was dated January 6.  Chest x-ray, as mentioned, mild left basilar atelectasis.  Pathology report is still pending.  DISPOSITION:                  The patient is discharged on Hemocyte one p.o. q.d., Tylox p.r.n. pain.  The clips have been removed.  She will return to the office in one week.  Advised to report any incisional redness  or drainage, increased pain or bleeding, or fever over 101.  She was given specific instructions regarding diet, sex, and exercise.  Again, she will return to our office in one week.  Activity  graded increase. DD:  01/01/00 TD:  01/02/00 Job: [id number]

## 2011-05-11 NOTE — Op Note (Signed)
NAMEJENEL, [name]             ACCOUNT NO.:  [id number]   MEDICAL RECORD NO.:  [id number]          PATIENT TYPE:  AMB   LOCATION:  DAY                          FACILITY:  [facility]    PHYSICIAN:  Timothy E. Earlene Plater, M.D. DATE OF BIRTH:  Apr 20, 1961   DATE OF PROCEDURE:  02/19/2005  DATE OF DISCHARGE:                                 OPERATIVE REPORT   PREOPERATIVE DIAGNOSIS:  Mucosal prolapse syndrome.   POSTOPERATIVE DIAGNOSIS:  Mucosal prolapse syndrome.   OPERATION/PROCEDURE:  Procedure for prolapsed hemorrhoids.   SURGEON:  Timothy E. Earlene Plater, M.D.   ANESTHESIA:  General.  [name] is 20; otherwise healthy.  She has had previous surgeries for  signs and symptoms of pelvic relaxation.  She has complaints of mucosal  prolapse syndrome with bleeding, soilage and protrusion of mucosa from the  rectum.  She denies constipation or difficulty moving the stool.  Symptoms  have increased over the past six months.  After careful discussion, she  wishes to proceed with the PPH procedure.  This has been carefully  explained.  She is seen, identified and permit signed.  She is seen and  evaluated by anesthesia.   DESCRIPTION OF PROCEDURE:  The patient is taken to the operating room and  placed supine.  LMA anesthesia provided.  She is carefully placed in the  lithotomy position.  Perianal area inspected, prepped and draped in the  usual fashion.  Minimal external swelling is noted at the anal orifice.  The  anus is lubricated, gently dilated.  Anoscopy carried out showing redundant  mucosal folds in the distal rectum.  The anus is injected round and about  with Marcaine, epinephrine and Wydase for wide field block.  Again the anus  is further dilated and the operating anoscope placed.  At approximately 4 cm  above the dentate line, a circumferential pursestring suture of 2-0 Prolene  is placed.  It is checked, feels secure.  The clear anoscope with the  Ethicon kit is then inserted.  The  suture ends are pulled through the  center.  With this properly placed, the  PPH stapling device wide open is  then inserted through the anoscope above the pursestring suture.  Again it  is checked.  The pursestring is pulled snugly and tied around the shaft of  the stapling device.  Then with proper traction, pressure and angulation,  the PPH stapler is carefully closed, held for 60 seconds, fired, held for 60  seconds, and then removed.  The complete circle of tissue is examined and  found to be intact and satisfactory.  Then the staple line is checked  repeatedly over the next 10 minutes.  One tiny area, left lateral, shows  some oozing.  This is suture ligated with 4-0 Vicryl.  The resulting staple  line is approximately 2.5 to 3 cm above the dentate line, completely  circumferentially.  No complications were noted and with this the procedure  is complete.  Gelfoam, gauze and dry sterile dressing applied.  She  tolerated it well.  Counts correct.  She was removed to recovery room in  good  condition.   Written and verbal instructions given to her and her husband, including  Percocet 5 mg #36 and she will be followed as an outpatient.      TED/MEDQ  D:  02/19/2005  T:  02/19/2005  Job:  [id number]

## 2011-05-11 NOTE — Procedures (Signed)
[facility]  Patient:    [name], [name]                MRN: [id number] Proc. Date: 01/31/01 Adm. Date:  [id number] Disc. Date: [id number] Attending:  Nelda Marseille CC:         Velora Heckler, M.D.   Procedure Report  PROCEDURE:  Esophagoscopy.  INDICATION:  Patient with odynophagia since eating chicken, thinking possibly a bone was stuck in her throat.  It seems to be mostly in her neck.  Consent was signed after risks, benefits, methods, and options thoroughly discussed with [name] in the past and before this procedure, as well as with her husband in the past.  MEDICINES USED:  Demerol 70 mg, Versed 7 mg.  DESCRIPTION OF PROCEDURE:  Using the new 160 upper video endoscope, it was inserted by direct vision.  A quick look at the posterior pharynx, including the vocal cords, appeared grossly normal.  We did easily advance into the esophagus and on slow advancement to the GE junction, no obvious abnormality was seen.  We withdrew back slowly through the esophagus and right at the upper esophageal sphincter, which seemed to be where she was most tender and did not like having the scope in that area, there seemed to be a very slight, shallow, small tear, which due to motility was difficult to photograph, but on a few occasions we were able to have a good visualization of it.  No obvious food particles, bone, etc., were seen.  We did withdraw back to to the posterior pharynx.  A quick look there again did not reveal any abnormality. We did reinsert the scope into the esophagus one more time and advanced to about 30 cm, which again did not reveal any additional findings, and again on the slow withdrawal confirmed the above findings.  No food particles, bone, etc., were seen.  The scope was removed.  The patient tolerated the procedure well.  There was no obvious immediate complications.  ENDOSCOPIC DIAGNOSIS: 1. Esophagoscopy only.   Believe a linear tear right at the upper esophageal    sphincter, which was small and shallow. 2. Otherwise normal esophagus.  PLAN:  Offered viscous lidocaine swish and swallow, but she has Hurricaine spray at home, which will be find.  Watch diet, avoiding salads, nuts, seeds, popcorn, etc., and if symptoms continue would probably ask an ear, nose, and throat doctor to see her early next week just to make sure.  They know to call me this weekend as needed. DD:  01/31/01 TD:  02/03/01 Job: [id number]

## 2011-08-16 ENCOUNTER — Other Ambulatory Visit: Payer: Self-pay | Admitting: Neurosurgery

## 2011-08-16 DIAGNOSIS — M47816 Spondylosis without myelopathy or radiculopathy, lumbar region: Secondary | ICD-10-CM

## 2011-08-20 ENCOUNTER — Ambulatory Visit
Admission: RE | Admit: 2011-08-20 | Discharge: 2011-08-20 | Disposition: A | Discharge status: Home / Self Care | Payer: BC Managed Care – PPO | Source: Ambulatory Visit | Attending: Neurosurgery | Admitting: Neurosurgery

## 2011-08-20 ENCOUNTER — Other Ambulatory Visit: Payer: Self-pay | Admitting: Neurosurgery

## 2011-08-20 DIAGNOSIS — M47816 Spondylosis without myelopathy or radiculopathy, lumbar region: Secondary | ICD-10-CM

## 2011-08-20 MED ORDER — IOHEXOL 180 MG/ML  SOLN
2.0000 mL | Freq: Once | INTRAMUSCULAR | Status: AC | PRN
  Administered 2011-08-20: 2 mL via INTRA_ARTICULAR

## 2011-08-20 MED ORDER — METHYLPREDNISOLONE ACETATE 40 MG/ML INJ SUSP (RADIOLOG
120.0000 mg | Freq: Once | INTRAMUSCULAR | Status: AC
  Administered 2011-08-20: 120 mg via INTRA_ARTICULAR

## 2011-09-20 LAB — COMPREHENSIVE METABOLIC PANEL
ALT: 45 — ABNORMAL HIGH
AST: 93 — ABNORMAL HIGH
Albumin: 3.8
Alkaline Phosphatase: 48
BUN: 14
CO2: 33 — ABNORMAL HIGH
Calcium: 9.3
Chloride: 101
Creatinine, Ser: 0.7
GFR calc Af Amer: >60
GFR calc non Af Amer: >60
Glucose, Bld: 106 — ABNORMAL HIGH
Potassium: 3.1 — ABNORMAL LOW
Sodium: 141
Total Bilirubin: 0.6
Total Protein: 6.4

## 2011-09-20 LAB — CBC
HCT: 32 — ABNORMAL LOW
Hemoglobin: 11.1 — ABNORMAL LOW
MCHC: 34.7
MCV: 102 — ABNORMAL HIGH
Platelets: 249
RBC: 3.14 — ABNORMAL LOW
RDW: 14.8
WBC: 6.9

## 2011-09-20 LAB — APTT: aPTT: 26

## 2011-09-20 LAB — PROTIME-INR
INR: 0.9
Prothrombin Time: 12.1

## 2011-09-21 ENCOUNTER — Other Ambulatory Visit (HOSPITAL_COMMUNITY): Payer: Self-pay | Admitting: Neurological Surgery

## 2011-09-21 DIAGNOSIS — M5416 Radiculopathy, lumbar region: Secondary | ICD-10-CM

## 2011-09-24 ENCOUNTER — Ambulatory Visit (HOSPITAL_COMMUNITY)
Admission: RE | Admit: 2011-09-24 | Discharge: 2011-09-24 | Disposition: A | Discharge status: Home / Self Care | Payer: BC Managed Care – PPO | Source: Ambulatory Visit | Attending: Neurological Surgery | Admitting: Neurological Surgery

## 2011-09-24 DIAGNOSIS — M5137 Other intervertebral disc degeneration, lumbosacral region: Secondary | ICD-10-CM | POA: Insufficient documentation

## 2011-09-24 DIAGNOSIS — M51379 Other intervertebral disc degeneration, lumbosacral region without mention of lumbar back pain or lower extremity pain: Secondary | ICD-10-CM | POA: Insufficient documentation

## 2011-09-24 DIAGNOSIS — M5416 Radiculopathy, lumbar region: Secondary | ICD-10-CM

## 2011-09-24 LAB — URINALYSIS, ROUTINE W REFLEX MICROSCOPIC
Bilirubin Urine: NEGATIVE
Glucose, UA: NEGATIVE
Hgb urine dipstick: NEGATIVE
Ketones, ur: NEGATIVE
Nitrite: NEGATIVE
Protein, ur: NEGATIVE
Specific Gravity, Urine: 1.028
Urobilinogen, UA: 0.2
pH: 5

## 2011-09-24 LAB — CBC
HCT: 24.8 — ABNORMAL LOW
HCT: 34.5 — ABNORMAL LOW
Hemoglobin: 11.9 — ABNORMAL LOW
Hemoglobin: 8.4 — ABNORMAL LOW
MCHC: 34
MCHC: 34.4
MCV: 98.5
MCV: 98.7
Platelets: 165
Platelets: 228
RBC: 2.52 — ABNORMAL LOW
RBC: 3.5 — ABNORMAL LOW
RDW: 12.4
RDW: 12.9
WBC: 5.7
WBC: 6

## 2011-09-24 LAB — COMPREHENSIVE METABOLIC PANEL
ALT: 18
AST: 18
Albumin: 3.7
Alkaline Phosphatase: 50
BUN: 10
CO2: 32
Calcium: 8.8
Chloride: 98
Creatinine, Ser: 0.64
GFR calc Af Amer: >60
GFR calc non Af Amer: >60
Glucose, Bld: 80
Potassium: 3.6
Sodium: 136
Total Bilirubin: 0.5
Total Protein: 6.5

## 2011-09-24 LAB — DIFFERENTIAL
Basophils Absolute: 0
Basophils Relative: 1
Eosinophils Absolute: 0.1
Eosinophils Relative: 1
Lymphocytes Relative: 15
Lymphs Abs: 0.8
Monocytes Absolute: 0.4
Monocytes Relative: 7
Neutro Abs: 4.4
Neutrophils Relative %: 77

## 2011-09-24 LAB — PROTIME-INR
INR: 0.9
Prothrombin Time: 12.4

## 2011-09-24 LAB — APTT: aPTT: 28

## 2011-09-24 MED ORDER — IOHEXOL 180 MG/ML  SOLN
14.0000 mL | Freq: Once | INTRAMUSCULAR | Status: AC | PRN
  Administered 2011-09-24: 14 mL via INTRATHECAL

## 2011-10-22 ENCOUNTER — Encounter (HOSPITAL_COMMUNITY)
Admission: RE | Admit: 2011-10-22 | Discharge: 2011-10-22 | Disposition: A | Discharge status: Home / Self Care | Payer: BC Managed Care – PPO | Source: Ambulatory Visit | Attending: Neurological Surgery | Admitting: Neurological Surgery

## 2011-10-22 ENCOUNTER — Ambulatory Visit (HOSPITAL_COMMUNITY)
Admission: RE | Admit: 2011-10-22 | Discharge: 2011-10-22 | Disposition: A | Discharge status: Home / Self Care | Payer: BC Managed Care – PPO | Source: Ambulatory Visit | Attending: Neurological Surgery | Admitting: Neurological Surgery

## 2011-10-22 ENCOUNTER — Other Ambulatory Visit (HOSPITAL_COMMUNITY): Payer: Self-pay | Admitting: Neurological Surgery

## 2011-10-22 DIAGNOSIS — Z01810 Encounter for preprocedural cardiovascular examination: Secondary | ICD-10-CM | POA: Insufficient documentation

## 2011-10-22 DIAGNOSIS — Z981 Arthrodesis status: Secondary | ICD-10-CM

## 2011-10-22 DIAGNOSIS — Z01818 Encounter for other preprocedural examination: Secondary | ICD-10-CM | POA: Insufficient documentation

## 2011-10-22 DIAGNOSIS — Z01812 Encounter for preprocedural laboratory examination: Secondary | ICD-10-CM | POA: Insufficient documentation

## 2011-10-22 LAB — BASIC METABOLIC PANEL
BUN: 21 mg/dL (ref 6–23)
CO2: 28 mEq/L (ref 19–32)
Calcium: 10.1 mg/dL (ref 8.4–10.5)
Chloride: 97 mEq/L (ref 96–112)
Creatinine, Ser: 0.86 mg/dL (ref 0.50–1.10)
GFR calc Af Amer: 89 mL/min — ABNORMAL LOW (ref >90)
GFR calc non Af Amer: 77 mL/min — ABNORMAL LOW (ref >90)
Glucose, Bld: 84 mg/dL (ref 70–99)
Potassium: 3.9 mEq/L (ref 3.5–5.1)
Sodium: 137 mEq/L (ref 135–145)

## 2011-10-22 LAB — SURGICAL PCR SCREEN
MRSA, PCR: NEGATIVE
Staphylococcus aureus: NEGATIVE

## 2011-10-22 LAB — TYPE AND SCREEN
ABO/RH(D): O POS
Antibody Screen: NEGATIVE

## 2011-10-22 LAB — CBC
HCT: 36.6 % (ref 36.0–46.0)
Hemoglobin: 12.5 g/dL (ref 12.0–15.0)
MCH: 33.3 pg (ref 26.0–34.0)
MCHC: 34.2 g/dL (ref 30.0–36.0)
MCV: 97.6 fL (ref 78.0–100.0)
Platelets: 259 10*3/uL (ref 150–400)
RBC: 3.75 MIL/uL — ABNORMAL LOW (ref 3.87–5.11)
RDW: 12.1 % (ref 11.5–15.5)
WBC: 8.9 10*3/uL (ref 4.0–10.5)

## 2011-10-22 LAB — ABO/RH: ABO/RH(D): O POS

## 2011-10-25 ENCOUNTER — Inpatient Hospital Stay (HOSPITAL_COMMUNITY): Payer: BC Managed Care – PPO

## 2011-10-25 ENCOUNTER — Inpatient Hospital Stay (HOSPITAL_COMMUNITY)
Admission: RE | Admit: 2011-10-25 | Discharge: 2011-10-28 | DRG: 756 | Disposition: A | Discharge status: Home / Self Care | Payer: BC Managed Care – PPO | Source: Ambulatory Visit | Attending: Neurological Surgery | Admitting: Neurological Surgery

## 2011-10-25 DIAGNOSIS — M47817 Spondylosis without myelopathy or radiculopathy, lumbosacral region: Principal | ICD-10-CM | POA: Diagnosis present

## 2011-10-25 DIAGNOSIS — I10 Essential (primary) hypertension: Secondary | ICD-10-CM | POA: Diagnosis present

## 2011-10-25 DIAGNOSIS — F3289 Other specified depressive episodes: Secondary | ICD-10-CM | POA: Diagnosis present

## 2011-10-25 DIAGNOSIS — F411 Generalized anxiety disorder: Secondary | ICD-10-CM | POA: Diagnosis present

## 2011-10-25 DIAGNOSIS — M5126 Other intervertebral disc displacement, lumbar region: Secondary | ICD-10-CM | POA: Diagnosis present

## 2011-10-25 DIAGNOSIS — Z87891 Personal history of nicotine dependence: Secondary | ICD-10-CM

## 2011-10-25 DIAGNOSIS — F329 Major depressive disorder, single episode, unspecified: Secondary | ICD-10-CM | POA: Diagnosis present

## 2011-10-26 MED ORDER — ACETAMINOPHEN 325 MG PO TABS: 650.0000 mg | ORAL_TABLET | ORAL | Status: DC | PRN

## 2011-10-26 MED ORDER — METHOCARBAMOL 500 MG PO TABS: 500.0000 mg | ORAL_TABLET | Freq: Four times a day (QID) | ORAL | Status: DC | PRN

## 2011-10-26 MED ORDER — DOCUSATE SODIUM 100 MG PO CAPS
100.0000 mg | ORAL_CAPSULE | Freq: Two times a day (BID) | ORAL | Status: DC
  Administered 2011-10-28: 100 mg via ORAL

## 2011-10-26 MED ORDER — ONDANSETRON HCL 4 MG/2ML IJ SOLN: 4.0000 mg | INTRAMUSCULAR | Status: DC | PRN

## 2011-10-26 MED ORDER — ALUM & MAG HYDROXIDE-SIMETH 200-200-20 MG/5ML PO SUSP: 30.0000 mL | Freq: Four times a day (QID) | ORAL | Status: DC | PRN

## 2011-10-26 MED ORDER — METHOCARBAMOL 750 MG PO TABS
750.0000 mg | ORAL_TABLET | Freq: Four times a day (QID) | ORAL | Status: DC
  Administered 2011-10-28 (×2): 750 mg via ORAL
  Filled 2011-10-26 (×11): qty 1

## 2011-10-26 MED ORDER — SODIUM CHLORIDE 0.9 % IJ SOLN: 3.0000 mL | Freq: Two times a day (BID) | INTRAMUSCULAR | Status: DC

## 2011-10-26 MED ORDER — METHOCARBAMOL 100 MG/ML IJ SOLN
750.0000 mg | Freq: Four times a day (QID) | INTRAVENOUS | Status: DC | PRN
  Filled 2011-10-26: qty 7.5

## 2011-10-26 MED ORDER — SENNA 8.6 MG PO TABS
1.0000 | ORAL_TABLET | Freq: Every evening | ORAL | Status: DC | PRN
  Administered 2011-10-28: 8.6 mg via ORAL
  Filled 2011-10-26: qty 1

## 2011-10-26 MED ORDER — ACETAMINOPHEN 650 MG RE SUPP: 650.0000 mg | RECTAL | Status: DC | PRN

## 2011-10-26 MED ORDER — METHOCARBAMOL 100 MG/ML IJ SOLN
500.0000 mg | Freq: Four times a day (QID) | INTRAVENOUS | Status: DC | PRN
  Filled 2011-10-26: qty 5

## 2011-10-26 MED ORDER — ALPRAZOLAM 0.5 MG PO TABS: 0.5000 mg | ORAL_TABLET | Freq: Two times a day (BID) | ORAL | Status: DC | PRN

## 2011-10-26 MED ORDER — METHOCARBAMOL 500 MG PO TABS: 750.0000 mg | ORAL_TABLET | Freq: Four times a day (QID) | ORAL | Status: DC | PRN

## 2011-10-26 MED ORDER — DIAZEPAM 5 MG PO TABS: 5.0000 mg | ORAL_TABLET | Freq: Four times a day (QID) | ORAL | Status: DC | PRN

## 2011-10-26 MED ORDER — PHENOL 1.4 % MT LIQD: 1.0000 | OROMUCOSAL | Status: DC | PRN

## 2011-10-26 MED ORDER — OXYCODONE-ACETAMINOPHEN 5-325 MG PO TABS
1.0000 | ORAL_TABLET | ORAL | Status: DC | PRN
  Administered 2011-10-28: 3 via ORAL
  Administered 2011-10-28: 1 via ORAL
  Administered 2011-10-28: 2 via ORAL

## 2011-10-26 MED ORDER — MENTHOL 3 MG MT LOZG: 1.0000 | LOZENGE | OROMUCOSAL | Status: DC | PRN

## 2011-10-26 MED ORDER — POTASSIUM CHLORIDE IN NACL 20-0.9 MEQ/L-% IV SOLN
INTRAVENOUS | Status: DC
  Filled 2011-10-26 (×4): qty 1000

## 2011-10-26 NOTE — Op Note (Signed)
[name], [name] NO.:  [id number]  MEDICAL RECORD NO.:  [id number]  LOCATION:  3029                         FACILITY:  [facility]  PHYSICIAN:  Stefani Dama, M.D.  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  PROCEDURES:  Bilateral laminotomy and foraminotomy, decompression of L4 and L5 nerve roots with dissection greater than required for simple posterior lumbar interbody fusion, posterior lumbar interbody fusion with PEEK spacers, local autograft and allograft, nonsegmental fixation at L4-L5 with pedicle screws, posterolateral arthrodesis with allograft.  SURGEON:  Dr. Barnett Abu.  FIRST ASSISTANT:  Coletta Memos, MD  ANESTHESIA:  General endotracheal.  INDICATION:  [name] is a 50 year old individual who has had significant back and right lower extremity pain.  She has had previous degenerative changes at L5-S1 and required surgical decompression arthrodesis using a Ray cage technique a number of years ago.  She has had some chronic back pain but more recently has developed a severe and unrelenting right lumbar radiculopathy and a myelogram demonstrated she had a foraminal compression of the L4 nerve root in addition to compression of L5 nerve root at L4-L5.  She is advised regarding the need for surgical stabilization of this process with decompression of the L4 and L5 nerve roots.  PROCEDURE:  The patient was brought to the operating room, supine on the stretcher.  After smooth induction of general endotracheal anesthesia, she was turned prone.  Back was prepped with alcohol and DuraPrep and draped in a sterile fashion.  An elliptical incision was made around a previous scar and this area was  excised.  Dissection was carried down to the lumbodorsal fascia, was opened on either side of midline to expose the spinous processes of L3 and the most superior aspect of L4 and L5. Dissection was then carefully undertaken at L5 and the L5 spinous process was then grabbed with a towel clip and moved about to see if any motion could be instilled between L5 and S1.  None could be instilled suggesting that there was indeed solid arthrodesis from L5-S1. Dissection was then carried out laterally to expose the L4-L5 facet joint and the dissection was carried cephalad to expose the pars of L4. The self-retaining retractor was placed in the wound and laminotomies were then created removing the inferior margin lamina out through the medial wall of the facet and entire facetectomy of the inferior facet of L4 was carried out.  This was done bilaterally.  The yellow ligament was taken out and the interlaminar space was then explored.  The common dural tube was then decompressed and path of the L4 nerve root superiorly was identified and soft tissue and redundant yellow ligamentous tissue was cleared from this area.  L5 nerve root was cleared inferiorly.  Centrally, the disk space was identified and there was noted to be a significant bulge centrally of the disk at L4-L5. Epidural veins in this area were then cauterized and divided.  The dissection was  carried out in a similar fashion on the opposite side, and a laminotomy was created removing the inferior margin lamina of L4 and complete facet joint.  The L4 and L5 nerve roots were similarly fully decompressed.  Then, a diskectomy was carried out on each side removing the entirety of the disk at the L4-L5 space scraping the endplates and decorticating them completely so as to allow for placement of interbody spacers.  Self-retaining disk spreading tool was used on one side while the opposite side was further cleaned once all the disk had been  removed, and the endplates were prepared appropriately, 12-mm PEEK spacers were placed into the interbody space along with autograft from the laminotomies and Vitoss-soaked bone sponge that was covered with PureGen.  The interbody space was then fitted with 2 PEEK spacers, one on the left, one on the right, and the interbody space was packed with bone graft material.  Once this was completed, attention was turned to the pedicle entry sites at L4 which was done through the pars region using the medial to lateral technique.  The 5.5 x 35-mm screws were placed in this fashion.  At L5, 6.5 x 40-mm screws were placed in a conventional fashion.  Care was taken to make sure that the pads of the L4 and L5 nerve roots were well protected.  Lateral gutters were then decorticated and once a short precontoured 40-mm rods were placed to hold the screws together, a bit of compression was induced into this construct.  The lateral gutters were then packed with allograft. Hemostasis in the soft tissues was obtained meticulously as the L4 and L5 nerve roots were each sounded out and found to be free and clear. With the bone graft being packed in the lateral gutters and hemostasis achieved, lumbodorsal fascia was then closed with #1 Vicryl in interrupted fashion 2-0 Vicryl in the subcutaneous tissues, and 3-0 Vicryl subcuticularly.  Prior to final layer of closure, I instilled 20 mL of 0.5% Marcaine into the paraspinous fascia on either side.  Blood loss for the procedure was estimated about 300 mL.  The patient tolerated the procedure well and was returned to recovery in stable condition.     Stefani Dama, M.D.     [name]  D:  10/25/2011  T:  10/26/2011  Job:  [id number]  Electronically Signed by Barnett Abu M.D. on 10/26/2011 05:50:27 AM

## 2011-10-28 MED ORDER — OXYCODONE HCL 5 MG PO TABS
10.0000 mg | ORAL_TABLET | ORAL | Status: DC | PRN
  Administered 2011-10-28 (×3): 10 mg via ORAL
  Filled 2011-10-28 (×2): qty 2

## 2011-10-28 MED ORDER — OXYCODONE HCL 10 MG PO TB12: 10.0000 mg | ORAL_TABLET | ORAL | Status: DC | PRN

## 2011-10-28 NOTE — Discharge Summary (Signed)
  [name], [name] NO.:  [id number]  MEDICAL RECORD NO.:  [id number]  LOCATION:  3029                         FACILITY:  [facility]  PHYSICIAN:  Stefani Dama, M.D.  DATE OF BIRTH:  [birth year]  DATE OF ADMISSION:  10/25/2011 DATE OF DISCHARGE:  10/28/2011                              DISCHARGE SUMMARY   ADMITTING DIAGNOSES:  Herniated nucleus pulposus plus spondylosis at L4- L5 with lumbar radiculopathy L5 right, status post arthrodesis at L5-S1 with Ray cage fusion.  DISCHARGED AND FINAL DIAGNOSES:  Herniated nucleus pulposus and spondylosis at L4-L5 with lumbar radiculopathy L5 right, status post arthrodesis at L5-S1 with Ray cage arthrodesis.  CONDITION ON DISCHARGE:  Improved.  HOSPITAL COURSE:  [name] is a 50 year old right-handed individual who has had significant problems with back and leg pain.  She has had previous decompression at L5-S1 and had a Ray cage fusion a number of years ago.  She has had persistence of low back pain, but over the past month, she has developed ever worsening right lumbar radiculopathy found to be secondary to a large subligamentous herniation of the disk at L4- L5 with degenerative spondylitic changes at that level.  Having failed efforts at conservative management, she was advised regarding the need for surgical decompression which was performed at L4-L5.  She tolerated posterior lumbar interbody arthrodesis with PEEK spacers at L4-L5 and supplemental fixation with pedicle screws.  She was ambulated on the first postoperative day, and subsequently has improved her ambulation to where she is functioning independently.  DISCHARGE MEDICATIONS: 1. She was discharged home with a prescription for Percocet 10/325 one     up to every 4 hours as needed for pain. 2. She is also given a prescription for Robaxin 750 mg to use every 6     hours as needed for muscle spasm.  DISCHARGE INSTRUCTIONS:  She will be seen in the office  in 3 weeks' time for followup.  No postoperative labs were performed.  Preoperative laboratories including a CBC demonstrates hemoglobin of 13 g with hematocrit of 36.  Electrolytes were normal.  She will be seen in the office in 3 weeks' time for further followup.     Stefani Dama, M.D.     [name]  D:  10/27/2011  T:  10/27/2011  Job:  [id number]  Electronically Signed by Barnett Abu M.D. on 10/28/2011 10:21:37 PM

## 2011-10-28 NOTE — Progress Notes (Signed)
Physical Therapy Treatment Patient Details Name: [name] MRN: [id number] DOB: Nov 22, 1961 Today's Date: 10/28/2011  PT Assessment/Plan  PT - Assessment/Plan Comments on Treatment Session: Patient reports independent with getting in/out of bed.  Did well with stairs.  Ready for discharge from PT standpoint. PT Plan: All goals met and education completed, patient dischaged from PT services PT Frequency: Min 5X/week Follow Up Recommendations: None Equipment Recommended: None recommended by PT PT Goals  Acute Rehab PT Goals PT Goal Formulation: With patient Time For Goal Achievement: 5 days Pt will Roll Supine to Right Side: with modified independence PT Goal: Rolling Supine to Right Side - Progress: Met Pt will Roll Supine to Left Side: with modified independence PT Goal: Rolling Supine to Left Side - Progress: Met Pt will go Supine/Side to Sit: with modified independence PT Goal: Supine/Side to Sit - Progress: Met Pt will Transfer Sit to Stand/Stand to Sit: with modified independence PT Transfer Goal: Sit to Stand/Stand to Sit - Progress: Met Pt will Transfer Bed to Chair/Chair to Bed: with modified independence PT Transfer Goal: Bed to Chair/Chair to Bed - Progress: Met Pt will Ambulate: >150 feet;with modified independence;with least restrictive assistive device PT Goal: Ambulate - Progress: Met Pt will Go Up / Down Stairs: 3-5 stairs;with rail(s);with modified independence PT Goal: Up/Down Stairs - Progress: Partly met (Pt required supervision due to slightly decreased balance.) Additional Goals Additional Goal #1: Independently report 3/3 back precautions. PT Goal: Additional Goal #1 - Progress: Met  PT Treatment Precautions/Restrictions  Precautions Precautions: Back Precaution Comments: Patient able to recall 3/3 back precautions. Required Braces or Orthoses: Yes Spinal Brace: Applied in sitting position;Thoracolumbosacral orthotic Mobility (including Balance) Bed  Mobility Bed Mobility: No Transfers Transfers: Yes Sit to Stand: 7: Independent;With armrests;From chair/3-in-1;From toilet Stand to Sit: 7: Independent;With armrests;To chair/3-in-1;To toilet Ambulation/Gait Ambulation/Gait: Yes Ambulation/Gait Assistance: 6: Modified independent (Device/Increase time) Ambulation Distance (Feet): 220 Feet Assistive device: None Gait Pattern: Decreased step length - right;Decreased step length - left;Step-through pattern Stairs: Yes Stairs Assistance: 5: Supervision Stairs Assistance Details (indicate cue type and reason): Visual, tactile, and verbal cues to use safe, correct technique. Stair Management Technique: One rail Left;Sideways Number of Stairs: 5     Exercise    End of Session PT - End of Session Equipment Utilized During Treatment: Gait belt;Back brace Activity Tolerance: Patient tolerated treatment well Patient left: in chair Nurse Communication: Other (comment) (Patient ready for discharge from PT standpoint.) General Behavior During Session: [facility] for tasks performed Cognition: [facility] for tasks performed 8:55-9:20 Luna Glasgow)  Vena Austria 10/28/2011, 9:40 AM

## 2011-10-28 NOTE — Progress Notes (Signed)
Pt discharged in stable condition home with all belongings and family. Medications and discharge instructions reviewed prior to discharge. 

## 2011-10-28 NOTE — Discharge Summary (Signed)
Physician Discharge Summary  Patient ID: [name] MRN: [id number] DOB/AGE: Apr 16, 1961 50 y.o.  Admit date: 10/25/2011 Discharge date: 10/28/2011  Admission Diagnoses:  Discharge Diagnoses:  Active Problems:  * No active hospital problems. *    Discharged Condition: good  Hospital Course: Uneventful post op course PSF lumbar spine  Consults: none  Significant Diagnostic Studies: labs: none  Treatments: surgery: Posterior spinal fusion L45  Discharge Exam: Blood pressure 111/59, pulse 84, temperature 97.9 F (36.6 C), temperature source Oral, resp. rate 20, height 5\' 5"  (1.651 m), weight 65.2 kg (143 lb 11.8 oz), SpO2 98.00%. VSS, afebrile, no new neuro deficits, wound clean and dry, No signs of infection.  Disposition: Home    Current Discharge Medication List    CONTINUE these medications which have NOT CHANGED   Details  ALPRAZolam (XANAX) 0.5 MG tablet Take 0.5 mg by mouth 2 (two) times daily as needed. For anxiety     estradiol (ESTRACE) 1 MG tablet Take 1 mg by mouth every morning.      FLUoxetine (PROZAC) 10 MG capsule Take 30 mg by mouth every morning.      levofloxacin (LEVAQUIN) 500 MG tablet Take 500 mg by mouth daily.      losartan (COZAAR) 100 MG tablet Take 100 mg by mouth every morning.      oxyCODONE-acetaminophen (PERCOCET) 5-325 MG per tablet Take 1 tablet by mouth every 6 (six) hours as needed. For pain    simvastatin (ZOCOR) 20 MG tablet Take 20 mg by mouth every morning.      triamterene-hydrochlorothiazide (MAXZIDE-25) 37.5-25 MG per tablet Take 1 tablet by mouth every morning.        STOP taking these medications     albuterol (PROVENTIL) (2.5 MG/3ML) 0.083% nebulizer solution          Signed: Suzan Slick 10/28/2011, 4:16 PM

## 2011-12-25 HISTORY — PX: AUGMENTATION MAMMAPLASTY: SUR837

## 2012-02-06 ENCOUNTER — Ambulatory Visit (INDEPENDENT_AMBULATORY_CARE_PROVIDER_SITE_OTHER): Payer: BC Managed Care – PPO | Admitting: Family Medicine

## 2012-02-06 VITALS — BP 122/79 | HR 85 | Temp 98.2°F | Resp 16 | Ht 64.0 in | Wt 144.8 lb

## 2012-02-06 DIAGNOSIS — F32A Depression, unspecified: Secondary | ICD-10-CM

## 2012-02-06 DIAGNOSIS — F3289 Other specified depressive episodes: Secondary | ICD-10-CM

## 2012-02-06 DIAGNOSIS — N959 Unspecified menopausal and perimenopausal disorder: Secondary | ICD-10-CM

## 2012-02-06 DIAGNOSIS — R05 Cough: Secondary | ICD-10-CM

## 2012-02-06 DIAGNOSIS — E785 Hyperlipidemia, unspecified: Secondary | ICD-10-CM

## 2012-02-06 DIAGNOSIS — J329 Chronic sinusitis, unspecified: Secondary | ICD-10-CM

## 2012-02-06 DIAGNOSIS — F329 Major depressive disorder, single episode, unspecified: Secondary | ICD-10-CM

## 2012-02-06 DIAGNOSIS — I10 Essential (primary) hypertension: Secondary | ICD-10-CM

## 2012-02-06 DIAGNOSIS — R059 Cough, unspecified: Secondary | ICD-10-CM

## 2012-02-06 LAB — POCT CBC
Granulocyte percent: 76.3 %G (ref 37–80)
HCT, POC: 36.6 % — AB (ref 37.7–47.9)
Hemoglobin: 11.4 g/dL — AB (ref 12.2–16.2)
Lymph, poc: 0.7 (ref 0.6–3.4)
MCH, POC: 30.3 pg (ref 27–31.2)
MCHC: 31.1 g/dL — AB (ref 31.8–35.4)
MCV: 97.3 fL — AB (ref 80–97)
MID (cbc): 0.6 (ref 0–0.9)
MPV: 9.2 fL (ref 0–99.8)
POC Granulocyte: 4.3 (ref 2–6.9)
POC LYMPH PERCENT: 13 %L (ref 10–50)
POC MID %: 10.7 %M (ref 0–12)
Platelet Count, POC: 260 10*3/uL (ref 142–424)
RBC: 3.76 M/uL — AB (ref 4.04–5.48)
RDW, POC: 13.5 %
WBC: 5.7 10*3/uL (ref 4.6–10.2)

## 2012-02-06 MED ORDER — LEVOFLOXACIN 500 MG PO TABS: 500.0000 mg | ORAL_TABLET | Freq: Every day | ORAL | Status: AC

## 2012-02-06 MED ORDER — BENZONATATE 100 MG PO CAPS: 100.0000 mg | ORAL_CAPSULE | Freq: Two times a day (BID) | ORAL | Status: AC | PRN

## 2012-02-06 NOTE — Progress Notes (Signed)
  Subjective:    Patient ID: [name], female    DOB: 27-Mar-1961, 51 y.o.   MRN: [id number]  HPI  Patient presents with a 2-3 day history of a cough productive of green-bloody mucus. Cough is worse at night, at times to the point she is unable to breath deeply.  Facial congestion ST Myalgias  Denies fever or chills  Recovery room nurse  PMH/ Spinal fusion 11/12(Dr. Elsner)  Review of Systems  Constitutional: Positive for fatigue.  HENT: Positive for congestion and postnasal drip. Negative for ear pain.   Respiratory: Positive for cough. Negative for shortness of breath and wheezing.        Objective:   Physical Exam  Constitutional: She appears well-developed and well-nourished.  HENT:  Head: Normocephalic and atraumatic.  Right Ear: External ear normal.  Left Ear: External ear normal.  Mouth/Throat: Oropharyngeal exudate (pnd) present.  Cardiovascular: Normal rate, regular rhythm and normal heart sounds.   Pulmonary/Chest: Effort normal.    Lymphadenopathy:    Cervical adenopathy: shoddy anterior nodes.  Neurological: She is alert.     Results for orders placed in visit on 02/06/12  POCT CBC      Component Value Range   WBC 5.7  4.6 - 10.2 (K/uL)   Lymph, poc 0.7  0.6 - 3.4    POC LYMPH PERCENT 13.0  10 - 50 (%L)   MID (cbc) 0.6  0 - 0.9    POC MID % 10.7  0 - 12 (%M)   POC Granulocyte 4.3  2 - 6.9    Granulocyte percent 76.3  37 - 80 (%G)   RBC 3.76 (*) 4.04 - 5.48 (M/uL)   Hemoglobin 11.4 (*) 12.2 - 16.2 (g/dL)   HCT, POC 78.2 (*) 95.6 - 47.9 (%)   MCV 97.3 (*) 80 - 97 (fL)   MCH, POC 30.3  27 - 31.2 (pg)   MCHC 31.1 (*) 31.8 - 35.4 (g/dL)   RDW, POC 21.3     Platelet Count, POC 260  142 - 424 (K/uL)   MPV 9.2  0 - 99.8 (fL)        Assessment & Plan:   1. Cough  POCT CBC  2. Sinusitis    3. RAD (reactive airway disease)    4. HTN (hypertension)    5. Depression    6. Dyslipidemia    7. Menopausal and perimenopausal disorder      Levoquin 500 mg X 10 days Tessalon Perles [facility] Anticipatory guidance Work note

## 2012-02-07 DIAGNOSIS — N959 Unspecified menopausal and perimenopausal disorder: Secondary | ICD-10-CM | POA: Insufficient documentation

## 2012-02-07 DIAGNOSIS — F32A Depression, unspecified: Secondary | ICD-10-CM | POA: Insufficient documentation

## 2012-02-07 DIAGNOSIS — E785 Hyperlipidemia, unspecified: Secondary | ICD-10-CM | POA: Insufficient documentation

## 2012-02-07 DIAGNOSIS — I10 Essential (primary) hypertension: Secondary | ICD-10-CM | POA: Insufficient documentation

## 2012-02-07 DIAGNOSIS — F329 Major depressive disorder, single episode, unspecified: Secondary | ICD-10-CM | POA: Insufficient documentation

## 2012-02-11 ENCOUNTER — Other Ambulatory Visit: Payer: Self-pay | Admitting: Family Medicine

## 2012-02-11 ENCOUNTER — Ambulatory Visit
Admission: RE | Admit: 2012-02-11 | Discharge: 2012-02-11 | Disposition: A | Discharge status: Home / Self Care | Payer: BC Managed Care – PPO | Source: Ambulatory Visit | Attending: Family Medicine | Admitting: Family Medicine

## 2012-02-11 DIAGNOSIS — R05 Cough: Secondary | ICD-10-CM

## 2012-02-11 DIAGNOSIS — R059 Cough, unspecified: Secondary | ICD-10-CM

## 2012-02-25 ENCOUNTER — Other Ambulatory Visit: Payer: Self-pay | Admitting: Obstetrics and Gynecology

## 2012-02-25 DIAGNOSIS — Z1231 Encounter for screening mammogram for malignant neoplasm of breast: Secondary | ICD-10-CM

## 2012-03-11 ENCOUNTER — Ambulatory Visit
Admission: RE | Admit: 2012-03-11 | Discharge: 2012-03-11 | Disposition: A | Discharge status: Home / Self Care | Payer: BC Managed Care – PPO | Source: Ambulatory Visit | Attending: Obstetrics and Gynecology | Admitting: Obstetrics and Gynecology

## 2012-03-11 DIAGNOSIS — Z1231 Encounter for screening mammogram for malignant neoplasm of breast: Secondary | ICD-10-CM

## 2012-04-04 ENCOUNTER — Encounter: Payer: Self-pay | Admitting: *Deleted

## 2012-05-20 ENCOUNTER — Other Ambulatory Visit: Payer: Self-pay | Admitting: Plastic Surgery

## 2013-04-27 ENCOUNTER — Telehealth: Payer: Self-pay | Admitting: *Deleted

## 2013-04-27 ENCOUNTER — Ambulatory Visit (INDEPENDENT_AMBULATORY_CARE_PROVIDER_SITE_OTHER): Payer: BC Managed Care – PPO | Admitting: Obstetrics and Gynecology

## 2013-04-27 ENCOUNTER — Encounter: Payer: Self-pay | Admitting: Obstetrics and Gynecology

## 2013-04-27 VITALS — BP 120/78 | Ht 64.0 in | Wt 152.0 lb

## 2013-04-27 DIAGNOSIS — Z0000 Encounter for general adult medical examination without abnormal findings: Secondary | ICD-10-CM

## 2013-04-27 DIAGNOSIS — Z1231 Encounter for screening mammogram for malignant neoplasm of breast: Secondary | ICD-10-CM

## 2013-04-27 DIAGNOSIS — Z01419 Encounter for gynecological examination (general) (routine) without abnormal findings: Secondary | ICD-10-CM

## 2013-04-27 DIAGNOSIS — Z113 Encounter for screening for infections with a predominantly sexual mode of transmission: Secondary | ICD-10-CM

## 2013-04-27 LAB — POCT URINALYSIS DIPSTICK
Bilirubin, UA: NEGATIVE
Blood, UA: NEGATIVE
Glucose, UA: NEGATIVE
Ketones, UA: NEGATIVE
Leukocytes, UA: NEGATIVE
Nitrite, UA: NEGATIVE
Protein, UA: NEGATIVE
Urobilinogen, UA: NEGATIVE
pH, UA: 5

## 2013-04-27 LAB — STD PANEL
HIV: NONREACTIVE
Hepatitis B Surface Ag: NEGATIVE

## 2013-04-27 LAB — HEPATITIS C ANTIBODY: HCV Ab: NEGATIVE

## 2013-04-27 MED ORDER — ESTRADIOL 1 MG PO TABS: 1.0000 mg | ORAL_TABLET | ORAL | Status: DC

## 2013-04-27 NOTE — Telephone Encounter (Signed)
Message copied by Elnora Morrison on Mon Apr 27, 2013  4:34 PM ------      Message from: Conley Simmonds      Created: Mon Apr 27, 2013 11:23 AM      Regarding: Referral to Dr. Sharin Mons,            Please refer this patient to Dr. Terri Piedra for rash on chest.  I am thinking patient may need a biopsy.  Patient said any time is fine.            I cannot put an order in for ambulatory dermatology referral.....does not seem to exist.            Thanks,            Conley Simmonds ------

## 2013-04-27 NOTE — Telephone Encounter (Signed)
appointment made for patient at Dr. Terri Piedra office , May 7th,2014 @ 2:30pm. Notes and demographics faxed. patient notified of this appointment. sue

## 2013-04-27 NOTE — Progress Notes (Signed)
Patient ID: [name], female   DOB: Feb 20, 1961, 52 y.o.   MRN: [id number] 52 y.o.  Single  Caucasian female   [phone] here for annual exam.   Divorced.  Dating. Wants to continue estradiol. Some hemorrhoids.  Has fecal incontinence if eats the wrong thing.   Still with leaking bladder.  Does not want intervention.  Has had multiple prior pelvic surgeries.   Status post mastopexy about 6 months ago. Has persistent rash on skin of chest wall.  Never goes away.  No LMP recorded. Patient has had a hysterectomy.          Sexually active: yes  The current method of family planning is status post hysterectomy.    Exercising:tennis Last mammogram: 01/2012   Last pap smear: 18 months ago:wnl History of abnormal pap: Yes:20years JYN:WGNFAOZHYQ/MVHQIONGEXB:MWUX reverted back to normal. Smoking: no Alcohol:1 glass of wine per day Last colonoscopy: 01/2012 hx polyps:Dr. Medoff:next colonoscopy 01/2017 Last Bone Density:  3-4 years ago:wnl Last tetanus shot: up to date Last cholesterol check: yearly with Dr. Mosetta Putt  Hgb: 11.9               Urine: Neg    Health Maintenance  Topic Date Due  . Pap Smear  07/28/1978  . Tetanus/tdap  07/29/1979  . Colonoscopy  07/28/2010  . Influenza Vaccine  08/24/2013  . Mammogram  03/11/2014    Family History  Problem Relation Age of Onset  . Hypertension Mother   . Hypertension Father   . Hyperlipidemia Sister   . Hypertension Sister   . Hyperlipidemia Brother   . Hypertension Brother   . Cancer Maternal Uncle   . Breast cancer Maternal Grandmother   . Cancer Maternal Grandmother   . Hypertension Brother   . Hypertension Brother     Patient Active Problem List   Diagnosis Date Noted  . HTN (hypertension) 02/07/2012  . Depression 02/07/2012  . Dyslipidemia 02/07/2012  . Menopausal and perimenopausal disorder 02/07/2012    Past Medical History  Diagnosis Date  . Hypertension   . Hyperlipidemia   . PVC (premature ventricular  contraction)   . Anemia   . Endometriosis   . Urinary incontinence     Past Surgical History  Procedure Laterality Date  . Lumbar fusion  2010    Dr. Channing Mutters  . Hernia repair    . Abdominal hysterectomy  2001    TAH/RSO  . Bladder suspension  2003    LSO and rectocele repair at [facility]    Allergies: Penicillins  Current Outpatient Prescriptions  Medication Sig Dispense Refill  . ALPRAZolam (XANAX) 0.5 MG tablet Take 0.5 mg by mouth 2 (two) times daily as needed. For anxiety       . estradiol (ESTRACE) 1 MG tablet Take 1 mg by mouth every morning.        Marland Kitchen FLUoxetine (PROZAC) 10 MG capsule Take 30 mg by mouth every morning.        Marland Kitchen losartan (COZAAR) 100 MG tablet Take 100 mg by mouth every morning.        . metoprolol succinate (TOPROL-XL) 25 MG 24 hr tablet Take 25 mg by mouth as needed.      Marland Kitchen oxyCODONE-acetaminophen (PERCOCET) 5-325 MG per tablet Take 1 tablet by mouth every 6 (six) hours as needed. For pain      . PARoxetine (PAXIL) 10 MG tablet Take 10 mg by mouth every morning.      . simvastatin (ZOCOR) 20 MG tablet Take  20 mg by mouth every morning.        . triamterene-hydrochlorothiazide (MAXZIDE-25) 37.5-25 MG per tablet Take 1 tablet by mouth every morning.         No current facility-administered medications for this visit.    ROS: Pertinent items are noted in HPI.  Social Hx:  Divorced.  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   Exam:    BP 120/78  Ht 5\' 4"  (1.626 m)  Wt 152 lb (68.947 kg)  BMI 26.08 kg/m2   Wt Readings from Last 3 Encounters:  04/27/13 152 lb (68.947 kg)  04/21/13 153 lb 9.6 oz (69.673 kg)  02/06/12 144 lb 12.8 oz (65.681 kg)     Ht Readings from Last 3 Encounters:  04/27/13 5\' 4"  (1.626 m)  04/21/13 5\' 4"  (1.626 m)  02/06/12 5\' 4"  (1.626 m)    General appearance: alert, cooperative and appears stated age Head: Normocephalic, without obvious abnormality, atraumatic Neck: no adenopathy, supple, symmetrical, trachea  midline and thyroid not enlarged, symmetric, no tenderness/mass/nodules Lungs: clear to auscultation bilaterally Breasts: Inspection negative, No nipple retraction or dimpling, No nipple discharge or bleeding, No axillary or supraclavicular adenopathy, Normal to palpation without dominant masses.  Consistent with bilateral breast surgery/implants. Heart: regular rate and rhythm Abdomen: soft, non-tender; bowel sounds normal; no masses,  no organomegaly. Vertical midline and Pfannenstiel incisions well healed. Extremities: extremities normal, atraumatic, no cyanosis or edema Skin: Skin color, texture, turgor normal.  Red raised, scattered dry erythematous lesions of chest wall in midline. Lymph nodes: Cervical, supraclavicular, and axillary nodes normal. No abnormal inguinal nodes palpated Neurologic: Grossly normal   Pelvic: External genitalia:  no lesions              Urethra:  normal appearing urethra with no masses, tenderness or lesions              Bartholins and Skenes: normal                 Vagina: normal appearing vagina with normal color and discharge, no lesions.  Urethra well supported.  Almost second degree receocele.                Cervix:  Absent.  Apex of vagina well supported.              Pap taken: no        Bimanual Exam:  Uterus:   Absent.                                      Adnexa:  Absent.                                      Rectovaginal: Confirms                                      Anus:  normal sphincter tone, no lesions  A: Urinary incontinence. Rectocele. ERT patient. Skin rash.  [facility] not needed. OK to continue Estrace 1 mg daily.  See Epic orders. Will do serum STD check - HIV, RPR, Hep C, HBsAg. Observation of prolapse and incontinence per patient request. Referral for Dermatology. return annually or  prn     An After Visit Summary was printed and given to the patient.

## 2013-04-27 NOTE — Patient Instructions (Signed)

## 2013-04-28 LAB — HEMOGLOBIN, FINGERSTICK: Hemoglobin, fingerstick: 11.9 g/dL — ABNORMAL LOW (ref 12.0–16.0)

## 2013-04-29 ENCOUNTER — Telehealth: Payer: Self-pay

## 2013-04-29 NOTE — Telephone Encounter (Signed)
Message copied by Alphonsa Overall on Wed Apr 29, 2013 10:46 AM ------      Message from: Conley Simmonds      Created: Wed Apr 29, 2013  7:56 AM       Please inform patient of negative STD results.  Hemoglobin is 11.9            Conley Simmonds ------

## 2013-04-29 NOTE — Telephone Encounter (Signed)
LMOVM to call for test results. 

## 2013-05-01 NOTE — Telephone Encounter (Signed)
Pt. Notified of lab results per J. Clelia Croft.

## 2013-06-05 ENCOUNTER — Other Ambulatory Visit: Payer: Self-pay | Admitting: Neurological Surgery

## 2013-06-05 DIAGNOSIS — M545 Low back pain, unspecified: Secondary | ICD-10-CM

## 2013-06-17 ENCOUNTER — Ambulatory Visit
Admission: RE | Admit: 2013-06-17 | Discharge: 2013-06-17 | Disposition: A | Discharge status: Home / Self Care | Payer: BC Managed Care – PPO | Source: Ambulatory Visit | Attending: Neurological Surgery | Admitting: Neurological Surgery

## 2013-06-17 DIAGNOSIS — M545 Low back pain, unspecified: Secondary | ICD-10-CM

## 2013-06-17 MED ORDER — IOHEXOL 180 MG/ML  SOLN
1.0000 mL | Freq: Once | INTRAMUSCULAR | Status: AC | PRN
  Administered 2013-06-17: 1 mL via EPIDURAL

## 2013-06-17 MED ORDER — METHYLPREDNISOLONE ACETATE 40 MG/ML INJ SUSP (RADIOLOG
120.0000 mg | Freq: Once | INTRAMUSCULAR | Status: AC
  Administered 2013-06-17: 120 mg via EPIDURAL

## 2013-06-23 ENCOUNTER — Other Ambulatory Visit: Payer: Self-pay | Admitting: Surgery

## 2013-07-09 ENCOUNTER — Other Ambulatory Visit: Payer: Self-pay | Admitting: Neurosurgery

## 2013-07-09 ENCOUNTER — Ambulatory Visit
Admission: RE | Admit: 2013-07-09 | Discharge: 2013-07-09 | Disposition: A | Discharge status: Home / Self Care | Payer: BC Managed Care – PPO | Source: Ambulatory Visit | Attending: Neurosurgery | Admitting: Neurosurgery

## 2013-07-09 DIAGNOSIS — M549 Dorsalgia, unspecified: Secondary | ICD-10-CM

## 2013-07-09 MED ORDER — METHYLPREDNISOLONE ACETATE 40 MG/ML INJ SUSP (RADIOLOG
120.0000 mg | Freq: Once | INTRAMUSCULAR | Status: AC
  Administered 2013-07-09: 120 mg via EPIDURAL

## 2013-07-09 MED ORDER — IOHEXOL 180 MG/ML  SOLN
1.0000 mL | Freq: Once | INTRAMUSCULAR | Status: AC | PRN
  Administered 2013-07-09: 1 mL via EPIDURAL

## 2013-07-21 ENCOUNTER — Encounter: Payer: Self-pay | Admitting: Internal Medicine

## 2013-07-21 ENCOUNTER — Ambulatory Visit (INDEPENDENT_AMBULATORY_CARE_PROVIDER_SITE_OTHER): Payer: BC Managed Care – PPO | Admitting: Internal Medicine

## 2013-07-21 VITALS — BP 130/86 | HR 85 | Temp 97.9°F | Wt 156.0 lb

## 2013-07-21 DIAGNOSIS — Z22322 Carrier or suspected carrier of Methicillin resistant Staphylococcus aureus: Secondary | ICD-10-CM

## 2013-07-21 NOTE — Progress Notes (Signed)
[facility] NOTE   RFV: community referral for staph aureus infection Subjective:    Patient ID: [name], female    DOB: 1961/01/27, 52 y.o.   MRN: [id number]  HPI  52yo F had intermittent rash between and beneath breast for a few months and was seen by dermatology, Dr. Terri Piedra where he thought it was consistent with presumed yeast infection placed on fluconazole and and ketoconazole and topical steroids for a total of 20 days. Since there was not much improvement, she did a shave biopsy, in July 1st and the path results show urticarial process but micro culture isolated CA-MRSA. She was placed on doxycycline for 2 wks, started on July 8th and stopped on 27th. Did not take the atarax. Complicated by vaginal yeast infection started taking diflucan on 7/14 now improved.Also did mupirocin twice a day x 5 days. Doxycycline didn't tolerate well since had nausea and diarrhea. Stool testing was negative by pcp on 7/16 .never had fevers or lymphadenopathy during this time. Her rash has improved with various treatments. She denies ever having ulcers, furuncles, or boils. Never known to have MRSA infection in the past. 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 Has not been at work for a month since they mentioned she can't work due to [employer] infection. Last worked in July 8th, not come back until cleared by MD. Wanted documentation of clear of infection.  She is doing well know, areas from shave biopsy have improved. She is looking forward to going back to work.  All = penicillin hives but ok with cephalexin  Current Outpatient Prescriptions on File Prior to Visit  Medication Sig Dispense Refill  . ALPRAZolam (XANAX) 0.5 MG tablet Take 0.5 mg by mouth 2 (two) times daily as needed. For anxiety       . estradiol (ESTRACE) 1 MG tablet Take 1 tablet (1 mg total) by mouth every morning.  30 tablet  11  . losartan (COZAAR) 100 MG tablet Take 100 mg by mouth  every morning.        . simvastatin (ZOCOR) 20 MG tablet Take 20 mg by mouth every morning.        . triamterene-hydrochlorothiazide (MAXZIDE-25) 37.5-25 MG per tablet Take 1 tablet by mouth every morning.         No current facility-administered medications on file prior to visit.   Active Ambulatory Problems    Diagnosis Date Noted  . HTN (hypertension) 02/07/2012  . Depression 02/07/2012  . Dyslipidemia 02/07/2012  . Menopausal and perimenopausal disorder 02/07/2012   Resolved Ambulatory Problems    Diagnosis Date Noted  . No Resolved Ambulatory Problems   Past Medical History  Diagnosis Date  . Hypertension   . Hyperlipidemia   . PVC (premature ventricular contraction)   . Anemia   . Endometriosis   . Urinary incontinence    History  Substance Use Topics  . Smoking status: Never Smoker   . Smokeless tobacco: Not on file  . Alcohol Use: Yes     Comment: 1 glass wine per night  family history includes Breast cancer in her maternal grandmother; Cancer in her maternal grandmother and maternal uncle; Hyperlipidemia in her brother and sister; and Hypertension in her brothers, father, mother, and sister.    Review of Systems  Constitutional: Negative for fever, chills, diaphoresis, activity change, appetite change, fatigue and unexpected weight change.  HENT: Negative for congestion, sore throat, rhinorrhea, sneezing, trouble swallowing  and sinus pressure.  Eyes: Negative for photophobia and visual disturbance.  Respiratory: Negative for cough, chest tightness, shortness of breath, wheezing and stridor.  Cardiovascular: Negative for chest pain, palpitations and leg swelling.  Gastrointestinal: Negative for nausea, vomiting, abdominal pain, diarrhea, constipation, blood in stool, abdominal distention and anal bleeding.  Genitourinary: Negative for dysuria, hematuria, flank pain and difficulty urinating.  Musculoskeletal: Negative for myalgias, back pain, joint swelling,  arthralgias and gait problem.  Skin: Negative for color change, pallor, rash and wound.  Neurological: Negative for dizziness, tremors, weakness and light-headedness.  Hematological: Negative for adenopathy. Does not bruise/bleed easily.  Psychiatric/Behavioral: positive for anxiety.       Objective:   Physical Exam BP 130/86  Pulse 85  Temp(Src) 97.9 F (36.6 C) (Oral)  Wt 156 lb (70.761 kg)  BMI 26.76 kg/m2 Physical Exam  Constitutional:  oriented to person, place, and time.appears well-developed and well-nourished. No distress.  HENT:  Mouth/Throat: Oropharynx is clear and moist. No oropharyngeal exudate.  Cardiovascular: Normal rate, regular rhythm and normal heart sounds. Exam reveals no gallop and no friction rub.  No murmur heard.  Pulmonary/Chest: Effort normal and breath sounds normal. No respiratory distress.  no wheezes.  Chest = midline there are 2 one centimeter circular areas from shave biopsy that are healed. Has scattered mm macules likely urticarial rash. Lymphadenopathy:  no cervical adenopathy.  Neurological: is alert and oriented to person, place, and time.  Skin: Skin is warm and dry. No rash noted. No erythema.  Psychiatric: a normal mood and affect.  behavior is normal.   Labs: cbc shows wbc 4.5 hct 35.2 plt 291 PMNs 63%.  7/6 culture: + Staph Aureus (R oxa, I clinda, S bactrim S doxy)     Assessment & Plan:  History of ca-mrsa skin infection = now resolved after completion of antibiotics and nasal decolonization with mupirocin. Will check nasal MRSA PCR screen to see whether she is still colonized.  In regards to returning to work, having mrsa skin infection does not preclude you from working in healthcare as long as the areas are covered. She has received adequate treatment and is medically cleared to return to work. We will provide letter for her to return to work  Spent 60 min with greater than 50% patient explaining results and coordination of  care

## 2013-07-22 ENCOUNTER — Encounter: Payer: Self-pay | Admitting: Internal Medicine

## 2013-07-24 NOTE — Progress Notes (Signed)
This encounter was created in error - please disregard.

## 2013-07-25 LAB — MRSA CULTURE

## 2013-10-07 ENCOUNTER — Encounter: Payer: Self-pay | Admitting: *Deleted

## 2013-10-15 ENCOUNTER — Encounter: Payer: Self-pay | Admitting: *Deleted

## 2013-10-26 ENCOUNTER — Ambulatory Visit
Admission: RE | Admit: 2013-10-26 | Discharge: 2013-10-26 | Disposition: A | Discharge status: Home / Self Care | Payer: 59 | Source: Ambulatory Visit | Attending: Obstetrics and Gynecology | Admitting: Obstetrics and Gynecology

## 2013-10-26 DIAGNOSIS — Z1231 Encounter for screening mammogram for malignant neoplasm of breast: Secondary | ICD-10-CM

## 2014-04-28 ENCOUNTER — Ambulatory Visit: Payer: BC Managed Care – PPO | Admitting: Obstetrics and Gynecology

## 2014-04-29 ENCOUNTER — Other Ambulatory Visit: Payer: Self-pay | Admitting: Neurological Surgery

## 2014-04-29 DIAGNOSIS — M549 Dorsalgia, unspecified: Secondary | ICD-10-CM

## 2014-05-04 ENCOUNTER — Other Ambulatory Visit: Payer: Self-pay | Admitting: Neurological Surgery

## 2014-05-04 ENCOUNTER — Ambulatory Visit
Admission: RE | Admit: 2014-05-04 | Discharge: 2014-05-04 | Disposition: A | Discharge status: Home / Self Care | Payer: 59 | Source: Ambulatory Visit | Attending: Neurological Surgery | Admitting: Neurological Surgery

## 2014-05-04 VITALS — BP 157/88 | HR 71

## 2014-05-04 DIAGNOSIS — M549 Dorsalgia, unspecified: Secondary | ICD-10-CM

## 2014-05-04 MED ORDER — OXYCODONE-ACETAMINOPHEN 5-325 MG PO TABS
1.0000 | ORAL_TABLET | Freq: Once | ORAL | Status: AC
  Administered 2014-05-04: 1 via ORAL

## 2014-05-04 MED ORDER — IOHEXOL 180 MG/ML  SOLN: 1.0000 mL | Freq: Once | INTRAMUSCULAR | Status: AC | PRN

## 2014-05-04 MED ORDER — METHYLPREDNISOLONE ACETATE 40 MG/ML INJ SUSP (RADIOLOG
120.0000 mg | Freq: Once | INTRAMUSCULAR | Status: AC
  Administered 2014-05-04: 120 mg via INTRA_ARTICULAR

## 2014-05-05 ENCOUNTER — Other Ambulatory Visit: Payer: Self-pay | Admitting: *Deleted

## 2014-05-05 MED ORDER — ESTRADIOL 1 MG PO TABS: 1.0000 mg | ORAL_TABLET | ORAL | Status: DC

## 2014-05-05 NOTE — Telephone Encounter (Signed)
Faxed refill request received from [facility] for ESTRADIOL Last filled by MD on 04/27/13, #30 X 11 Last AEX - 04/27/13  Next AEX - cancelled 04/2014 appt, did not reschedule.   Last MMG - 10/28/13, normal One month supply sent to pharmacy with note that pt must have appt for more refills.

## 2014-05-31 ENCOUNTER — Other Ambulatory Visit: Payer: Self-pay | Admitting: Neurological Surgery

## 2014-05-31 DIAGNOSIS — M545 Low back pain, unspecified: Secondary | ICD-10-CM

## 2014-06-15 ENCOUNTER — Ambulatory Visit (HOSPITAL_COMMUNITY)
Admission: RE | Admit: 2014-06-15 | Discharge: 2014-06-15 | Disposition: A | Discharge status: Home / Self Care | Payer: 59 | Source: Ambulatory Visit | Attending: General Surgery | Admitting: General Surgery

## 2014-06-15 ENCOUNTER — Other Ambulatory Visit (INDEPENDENT_AMBULATORY_CARE_PROVIDER_SITE_OTHER): Payer: Self-pay

## 2014-06-15 ENCOUNTER — Ambulatory Visit (INDEPENDENT_AMBULATORY_CARE_PROVIDER_SITE_OTHER): Payer: 59 | Admitting: General Surgery

## 2014-06-15 ENCOUNTER — Encounter (INDEPENDENT_AMBULATORY_CARE_PROVIDER_SITE_OTHER): Payer: Self-pay | Admitting: General Surgery

## 2014-06-15 VITALS — BP 124/76

## 2014-06-15 DIAGNOSIS — R1032 Left lower quadrant pain: Secondary | ICD-10-CM

## 2014-06-15 DIAGNOSIS — R109 Unspecified abdominal pain: Secondary | ICD-10-CM

## 2014-06-15 NOTE — Progress Notes (Addendum)
Patient ID: [name], female   DOB: 09-02-1961, 53 y.o.   MRN: [id number]  Chief Complaint  Patient presents with  . eval hernia    HPI [name] is a 53 y.o. female.  The patient is a 53 year old female who is referred by Dr. Harlow Asa for an evaluation of a possible left inguinal hernia. The patient states that she began and left inguinal pain approximately 2-3 days ago. She states she also some left leg pain and weakness to accompany this. She states she has a fullness in the left inguinal area and has a throbbing pain. She states that when she tries and loose extremity she'll sharp pain.  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 HPI  Past Medical History  Diagnosis Date  . Hypertension   . Hyperlipidemia   . PVC (premature ventricular contraction)   . Anemia   . Endometriosis   . Urinary incontinence     Past Surgical History  Procedure Laterality Date  . Lumbar fusion  2010    Dr. Carloyn Manner  . Hernia repair    . Abdominal hysterectomy  2001    TAH/RSO  . Bladder suspension  2003    LSO and rect[facility]cele repair at [facility] History  Problem Relation Age of Onset  . Hypertension Mother   . Hypertension Father   . Hyperlipidemia Sister   . Hypertension Sister   . Hyperlipidemia Brother   . Hypertension Brother   . Cancer Maternal Uncle   . Breast cancer Maternal Grandmother   . Cancer Maternal Grandmother   . Hypertension Brother   . Hypertension Brother     Social History History  Substance Use Topics  . Smoking status: Current Some Day Smoker  . Smokeless tobacco: Not on file  . Alcohol Use: Yes     Comment: 1 glass wine per night    Allergies  Allergen Reactions  . Penicillins Hives    Current Outpatient Prescriptions  Medication Sig Dispense Refill  . ALPRAZolam (XANAX) 0.5 MG tablet Take 0.5 mg by mouth 2 (two) times daily as needed. For anxiety       . estradiol (ESTRACE) 1 MG tablet Take 1 tablet (1 mg total) by  mouth every morning. MUST HAVE APPT FOR MORE REFILLS.  30 tablet  0  . losartan (COZAAR) 100 MG tablet Take 100 mg by mouth every morning.        . sertraline (ZOLOFT) 20 MG/ML concentrated solution Take 10 mg by mouth daily.      . simvastatin (ZOCOR) 20 MG tablet Take 20 mg by mouth every morning.        . triamterene-hydrochlorothiazide (MAXZIDE-25) 37.5-25 MG per tablet Take 1 tablet by mouth every morning.        . zolpidem (AMBIEN) 5 MG tablet Take 5 mg by mouth at bedtime as needed for sleep.       No current facility-administered medications for this visit.    Review of Systems Review of Systems  Constitutional: Negative.   HENT: Negative.   Respiratory: Negative.   Cardiovascular: Negative.   Gastrointestinal: Negative.   Neurological: Negative.   All other systems reviewed and are negative.   Blood pressure 124/76.  Physical Exam Physical Exam  Constitutional: She is oriented to person, place, and time. She appears well-developed and well-nourished.  HENT:  Head: Normocephalic and atraumatic.  Eyes: Conjunctivae and EOM are normal. Pupils are equal, round, and reactive to light.  Neck: Normal range of motion. Neck supple.  Cardiovascular: Normal rate, regular rhythm and normal heart sounds.   Pulmonary/Chest: Effort normal and breath sounds normal.  Abdominal: Soft. Bowel sounds are normal. She exhibits no mass. There is no tenderness. There is no rebound and no guarding. No hernia (no overt bulge or palpable hernia on palpation.).    Musculoskeletal: Normal range of motion.  Neurological: She is alert and oriented to person, place, and time.  Skin: Skin is warm and dry.  Psychiatric: She has a normal mood and affect.    Data Reviewed none  Assessment    53 year old female with left inguinodynia. The patient could likely have a small left femoral hernia versus possible inguinal hernia. Secondary to the patient's area of pain she could also have pathology with  her hip joint.     Plan    1. This time we will proceed with a CT scan without IV or by mouth contrast to evaluate for possible inguinal versus femoral hernia. At this time we can also evaluate her left hip joint to look for any pathology. 2. I will call the patient back with results of this this afternoon.        Rosario Jacks., Ameliana Brashear 06/15/2014, 2:37 PM   Addendum: CT scan was obtained of the abdomen and pelvis. There was no hernia that could be seen on CT scan. There was no femoral hernia upon my review.  The patient does have a history of lower back spinal fusion. This could potentially be contributing to weakness as well as pain in her left inguinal area. I did discuss with the patient the results of the CT scan of the lack of any hernia findings. At this point I would recommend patient try a trial of NSAIDs see if this helps with the pain. I did discuss with her the disc and potential be originating from her lower back and previous surgery. I did recommend her to avoid any heavy lifting.  We did discuss the possibility of proceeding with a left femur hernia repair with some patience to obtain relief from pain from either a possible obturator hernia or small unseen femoral hernia. The patient is she understands this and wants to leave this as a last effort to want to try and set at this point. The dimension or possible we would need to have her back and evaluated.  The patient's questions were answered to her satisfaction. We'll have patient follow back up in 2 weeks to further discuss any progress relief of pain.

## 2014-06-16 ENCOUNTER — Telehealth (INDEPENDENT_AMBULATORY_CARE_PROVIDER_SITE_OTHER): Payer: Self-pay

## 2014-06-16 NOTE — Telephone Encounter (Signed)
Called and left message for patient to call our office re: Patient's CT shows no hernia, try NSAIDs and see if this helps and if pain has changed at all.  Patient scheduled for a 2 week follow up appointment on 07/01/14 @ 12:10pm w/Dr. Rosendo Gros.

## 2014-06-16 NOTE — Telephone Encounter (Signed)
Message copied by Ivor Costa on Wed Jun 16, 2014 12:42 PM ------      Message from: Ralene Ok      Created: Tue Jun 15, 2014  5:21 PM       Can you get her scheduled to come back and see me in 2 weeks.  She did not have a hernia on CT scan and going to try NSAIDs and see how she does with that then get her back here to see if her pain has changed at all.            You're the best      AR ------

## 2014-06-18 ENCOUNTER — Telehealth: Payer: Self-pay

## 2014-06-18 NOTE — Telephone Encounter (Signed)
Last AEX: 04/27/13 Pt is due to AEX Last filled 5/5 14 #30 11 refills MAMM: 10/2013 Can fill at next AEX LMOM to call office back concerning a refill
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
## 2014-06-22 NOTE — Telephone Encounter (Signed)
LMOM to contact office 

## 2014-06-23 MED ORDER — ESTRADIOL 1 MG PO TABS: 1.0000 mg | ORAL_TABLET | ORAL | Status: DC

## 2014-06-23 NOTE — Telephone Encounter (Signed)
RF through AEX done.  If misses appt, NO MORE RFs.

## 2014-06-23 NOTE — Telephone Encounter (Signed)
Called pt informing RF until her AEX with Dr. Quincy Simmonds. Pt informed Encounter closed

## 2014-07-01 ENCOUNTER — Ambulatory Visit (INDEPENDENT_AMBULATORY_CARE_PROVIDER_SITE_OTHER): Payer: 59 | Admitting: General Surgery

## 2014-07-01 ENCOUNTER — Encounter (INDEPENDENT_AMBULATORY_CARE_PROVIDER_SITE_OTHER): Payer: Self-pay | Admitting: General Surgery

## 2014-07-01 VITALS — BP 128/84 | HR 62 | Temp 98.1°F | Resp 18 | Ht 65.0 in | Wt 164.0 lb

## 2014-07-01 DIAGNOSIS — R1032 Left lower quadrant pain: Secondary | ICD-10-CM

## 2014-07-01 DIAGNOSIS — R109 Unspecified abdominal pain: Secondary | ICD-10-CM

## 2014-07-01 NOTE — Progress Notes (Signed)
Subjective:     Patient ID: [name], female   DOB: Jan 15, 1961, 53 y.o.   MRN: [id number]  HPI The patient is a 53 year old female was recently seen secondary to left inguinodynia. Patient states that she's continued to have pain at all times including when she stands, goes upstairs, and at nighttime when she is laying down. She states it and sent helped.  Patient has had a history of spinal fusion per Dr. Ellene Route. She states an MRI 4 months ago was normal.  Review of Systems  Constitutional: Negative.   HENT: Negative.   Respiratory: Negative.   Cardiovascular: Negative.   Gastrointestinal: Negative.   Neurological: Negative.   All other systems reviewed and are negative.      Objective:   Physical Exam  Constitutional: She is oriented to person, place, and time. She appears well-developed and well-nourished.  HENT:  Head: Normocephalic and atraumatic.  Eyes: Conjunctivae and EOM are normal. Pupils are equal, round, and reactive to light.  Neck: Normal range of motion. Neck supple.  Cardiovascular: Normal rate, regular rhythm and normal heart sounds.   Pulmonary/Chest: Effort normal and breath sounds normal.  Abdominal: Hernia confirmed negative in the right inguinal area and confirmed negative in the left inguinal area (no palpable bulge or mass palpated.).  Musculoskeletal: Normal range of motion.       Left hip: She exhibits decreased strength (secondary to pain.).  Neurological: She is alert and oriented to person, place, and time.  Skin: Skin is warm and dry.  Psychiatric: She has a normal mood and affect.       Assessment:     53 year old female with left inguinal hernia. Did not think at this time secondary to the timing of the pain this is in-line to any hernia. Her CT scan was negative for any hernia including the femoral/inguinal hernia. I have asked her at This time to revisit with Dr. Ellene Route to allow any spinal/nerve issues and because of pain.    Plan:      1. The patient will call us back if there are no spinal issues identified causing her pain. I did discuss with her we could a last resort move forward with a left hernia repair and some patience to get relief from pain and inguinodynia. She does state that she wants [place] to be her last resort as well. 2. The patient will call us back for further followup.

## 2014-07-13 ENCOUNTER — Other Ambulatory Visit: Payer: Self-pay | Admitting: Neurological Surgery

## 2014-07-13 DIAGNOSIS — M5416 Radiculopathy, lumbar region: Secondary | ICD-10-CM

## 2014-08-05 ENCOUNTER — Ambulatory Visit (INDEPENDENT_AMBULATORY_CARE_PROVIDER_SITE_OTHER): Payer: 59 | Admitting: Obstetrics and Gynecology

## 2014-08-05 ENCOUNTER — Encounter: Payer: Self-pay | Admitting: Obstetrics and Gynecology

## 2014-08-05 VITALS — BP 124/82 | HR 60 | Resp 16 | Ht 63.75 in | Wt 163.0 lb

## 2014-08-05 DIAGNOSIS — N3946 Mixed incontinence: Secondary | ICD-10-CM

## 2014-08-05 DIAGNOSIS — Z01419 Encounter for gynecological examination (general) (routine) without abnormal findings: Secondary | ICD-10-CM

## 2014-08-05 MED ORDER — FESOTERODINE FUMARATE ER 4 MG PO TB24: 4.0000 mg | ORAL_TABLET | Freq: Every day | ORAL | Status: DC

## 2014-08-05 MED ORDER — ESTRADIOL 1 MG PO TABS: 1.0000 mg | ORAL_TABLET | ORAL | Status: DC

## 2014-08-05 NOTE — Progress Notes (Signed)
Patient ID: [name], female   DOB: 1961/09/20, 53 y.o.   MRN: [id number] GYNECOLOGY VISIT  PCP: Derinda Late, MD  Referring provider:   HPI: 53 y.o.   Divorced  Caucasian  female   [phone] with No LMP recorded. Patient has had a hysterectomy.   here for AEX.   Wants to continue on Estradiol 1 mg.  Had hair loss when ran out recently.   Still with back problems.  Two bulging discs.  Doing epidural injections.   Urge to void.  Voiding often.  Voids more than a small amount.  Up 5 times last hs. Leaking with coughing, sneezing, and laughing.  Hx HTN. Wants to try medication.   Hgb:    PCP Urine:  PCP  GYNECOLOGIC HISTORY: No LMP recorded. Patient has had a hysterectomy. Sexually active:  yes Partner preference: female Contraception:  hysterectomy Menopausal hormone therapy: Estradiol 1mg  DES exposure:  no  Blood transfusions: yes, following hysterectomy--had a bleed.   Sexually transmitted diseases:  no  GYN procedures and prior surgeries: 2001 TAH/RSO, 2003 bladder suspension/LSO and rectocele repair at [place] mammogram: 10-26-13  [place]              Last pap and high risk HPV testing:  2 years ago wnl  History of abnormal pap smear:  Yes, over 20 years PFX:[id number].  Paps reverted to normal.   OB History   Grav Para Term Preterm Abortions TAB SAB Ect Mult Living   5 3 3  2  2   3        LIFESTYLE: Exercise: no              Tobacco: socially Alcohol:   5 glasses of wine per week Drug use:  no  OTHER HEALTH MAINTENANCE: Tetanus/TDap:  Up to date Gardisil:             n/a Influenza:          09/2013 Zostavax:           n/a  Bone density:   4 years [id number] Colonoscopy:   01/2012 hx of polyps with Dr. Earlean Shawl.  Next colonoscopy due 01/2017.  Cholesterol check:  Elevated but can't take meds.  Family History  Problem Relation Age of Onset  . Hypertension Mother   . Hypertension Father   . Hyperlipidemia Sister   .  Hypertension Sister   . Hyperlipidemia Brother   . Hypertension Brother   . Cancer Maternal Uncle 72    colon ca  . Breast cancer Maternal Grandmother   . Cancer Maternal Grandmother 75    colon ca  . Hypertension Brother   . Hypertension Brother     Patient Active Problem List   Diagnosis Date Noted  . HTN (hypertension) 02/07/2012  . Depression 02/07/2012  . Dyslipidemia 02/07/2012  . Menopausal and perimenopausal disorder 02/07/2012   Past Medical History  Diagnosis Date  . Hypertension   . Hyperlipidemia   . PVC (premature ventricular contraction)   . Anemia   . Endometriosis   . Urinary incontinence   . Bulging discs   . Blood transfusion without reported diagnosis 01/1999    following hysterectomy    Past Surgical History  Procedure Laterality Date  . Lumbar fusion  2010    Dr. Carloyn Manner  . Hernia repair    . Abdominal hysterectomy  2001    TAH/RSO  . Bladder suspension  2003    [facility]  ALLERGIES: Penicillins  Current Outpatient Prescriptions  Medication Sig Dispense Refill  . ALPRAZolam (XANAX) 0.5 MG tablet Take 0.5 mg by mouth 2 (two) times daily as needed. For anxiety       . Biotin (BIOTIN 5000) 5 MG CAPS Take 1 capsule by mouth 2 (two) times daily.      Marland Kitchen estradiol (ESTRACE) 1 MG tablet Take 1 tablet (1 mg total) by mouth every morning.  30 tablet  1  . losartan (COZAAR) 100 MG tablet Take 100 mg by mouth every morning.        . methocarbamol (ROBAXIN) 500 MG tablet Take 500 mg by mouth as needed for muscle spasms.      . naproxen sodium (ANAPROX) 220 MG tablet Take 220 mg by mouth daily.      . sertraline (ZOLOFT) 20 MG/ML concentrated solution Take 10 mg by mouth daily.      Marland Kitchen triamterene-hydrochlorothiazide (MAXZIDE-25) 37.5-25 MG per tablet Take 1 tablet by mouth every morning.        . TURMERIC PO Take 1 tablet by mouth 2 (two) times daily.      Marland Kitchen zolpidem (AMBIEN) 5 MG tablet Take 5 mg by mouth at bedtime as needed for  sleep.       No current facility-administered medications for this visit.     ROS:  Pertinent items are noted in HPI.  History   Social History  . Marital Status: Divorced    Spouse Name: N/A    Number of Children: N/A  . Years of Education: N/A   Occupational History  . Not on file.   Social History Main Topics  . Smoking status: Current Some Day Smoker  . Smokeless tobacco: Not on file  . Alcohol Use: 2.5 oz/week    5 drink(s) per week     Comment: 1 glass wine per night  . Drug Use: No  . Sexual Activity: Yes    Partners: Male    Birth Control/ Protection: Surgical     Comment: TAH   Other Topics Concern  . Not on file   Social History Narrative  . No narrative on file    PHYSICAL EXAMINATION:    BP 124/82  Pulse 60  Resp 16  Ht 5' 3.75" (1.619 m)  Wt 163 lb (73.936 kg)  BMI 28.21 kg/m2   Wt Readings from Last 3 Encounters:  08/05/14 163 lb (73.936 kg)  07/01/14 164 lb (74.39 kg)  07/21/13 156 lb (70.761 kg)     Ht Readings from Last 3 Encounters:  08/05/14 5' 3.75" (1.619 m)  07/01/14 5\' 5"  (1.651 m)  04/27/13 5\' 4"  (1.626 m)    General appearance: alert, cooperative and appears stated age Head: Normocephalic, without obvious abnormality, atraumatic Neck: no adenopathy, supple, symmetrical, trachea midline and thyroid not enlarged, symmetric, no tenderness/mass/nodules Lungs: clear to auscultation bilaterally Breasts: bilateral implants, No nipple retraction or dimpling, No nipple discharge or bleeding, No axillary or supraclavicular adenopathy, Normal to palpation without dominant masses Heart: regular rate and rhythm Abdomen: vertical and Pfannenstiel incisions, soft, non-tender; no masses,  no organomegaly Extremities: extremities normal, atraumatic, no cyanosis or edema Skin: Skin color, texture, turgor normal. No rashes or lesions Lymph nodes: Cervical, supraclavicular, and axillary nodes normal. No abnormal inguinal nodes  palpated Neurologic: Grossly normal  Pelvic: External genitalia:  no lesions              Urethra:  normal appearing urethra with no masses, tenderness or lesions  Bartholins and Skenes: normal                 Vagina: normal appearing vagina with normal color and discharge, no lesions.  Vaginal apex with mesh under mucosa.              Cervix:  absent              Pap and high risk HPV testing done: No..            Bimanual Exam:  Uterus:   absent                                      Adnexa: normal adnexa in size, nontender and no masses                                      Rectovaginal: yes                                      Confirms above.                                      Anus:  normal sphincter tone, no lesions  ASSESSMENT  Normal gynecologic exam. Status post TAH/BSO/colpopexy/sling.  ERT patient.  Mixed incontinence.  PLAN  Mammogram recommended yearly starting at age 89. Pap smear and high risk HPV testing as above. Counseled on self breast exam and ERT.  Discussed risks of ERT - PE, MI, stroke, DVT.  Refill Estrace 1 mg.  See orders. Toviaz 4 mg daily.  See orders.  May consider repeat midurethral sling.  See lab orders:  no Return annually or prn   An After Visit Summary was printed and given to the patient.

## 2014-08-05 NOTE — Patient Instructions (Signed)

## 2014-08-06 ENCOUNTER — Other Ambulatory Visit: Payer: 59

## 2014-09-02 ENCOUNTER — Other Ambulatory Visit (HOSPITAL_COMMUNITY): Payer: Self-pay | Admitting: Family Medicine

## 2014-09-02 ENCOUNTER — Ambulatory Visit (HOSPITAL_COMMUNITY)
Admission: RE | Admit: 2014-09-02 | Discharge: 2014-09-02 | Disposition: A | Discharge status: Home / Self Care | Payer: 59 | Source: Ambulatory Visit | Attending: Vascular Surgery | Admitting: Vascular Surgery

## 2014-09-02 DIAGNOSIS — R0989 Other specified symptoms and signs involving the circulatory and respiratory systems: Secondary | ICD-10-CM

## 2014-10-25 ENCOUNTER — Encounter: Payer: Self-pay | Admitting: Obstetrics and Gynecology

## 2015-01-03 ENCOUNTER — Other Ambulatory Visit: Payer: Self-pay

## 2015-01-03 DIAGNOSIS — Z1231 Encounter for screening mammogram for malignant neoplasm of breast: Secondary | ICD-10-CM

## 2015-01-10 ENCOUNTER — Ambulatory Visit
Admission: RE | Admit: 2015-01-10 | Discharge: 2015-01-10 | Disposition: A | Discharge status: Home / Self Care | Payer: 59 | Source: Ambulatory Visit

## 2015-01-10 DIAGNOSIS — Z1231 Encounter for screening mammogram for malignant neoplasm of breast: Secondary | ICD-10-CM

## 2015-03-23 ENCOUNTER — Other Ambulatory Visit: Payer: Self-pay | Admitting: Otolaryngology

## 2015-03-23 DIAGNOSIS — H9311 Tinnitus, right ear: Secondary | ICD-10-CM

## 2015-04-11 ENCOUNTER — Inpatient Hospital Stay: Admission: RE | Admit: 2015-04-11 | Payer: 59 | Source: Ambulatory Visit

## 2015-04-14 ENCOUNTER — Other Ambulatory Visit: Payer: 59

## 2015-04-21 ENCOUNTER — Ambulatory Visit
Admission: RE | Admit: 2015-04-21 | Discharge: 2015-04-21 | Disposition: A | Discharge status: Home / Self Care | Payer: 59 | Source: Ambulatory Visit | Attending: Otolaryngology | Admitting: Otolaryngology

## 2015-04-21 DIAGNOSIS — H9311 Tinnitus, right ear: Secondary | ICD-10-CM

## 2015-04-21 MED ORDER — GADOBENATE DIMEGLUMINE 529 MG/ML IV SOLN
15.0000 mL | Freq: Once | INTRAVENOUS | Status: AC | PRN
Start: 2015-04-21 — End: 2015-04-21

## 2015-04-29 ENCOUNTER — Ambulatory Visit (INDEPENDENT_AMBULATORY_CARE_PROVIDER_SITE_OTHER): Payer: 59 | Admitting: Physician Assistant

## 2015-04-29 VITALS — BP 146/88 | HR 107 | Temp 97.9°F | Resp 20 | Ht 65.0 in | Wt 164.5 lb

## 2015-04-29 DIAGNOSIS — R1013 Epigastric pain: Secondary | ICD-10-CM | POA: Diagnosis not present

## 2015-04-29 DIAGNOSIS — K921 Melena: Secondary | ICD-10-CM | POA: Diagnosis not present

## 2015-04-29 DIAGNOSIS — R112 Nausea with vomiting, unspecified: Secondary | ICD-10-CM | POA: Diagnosis not present

## 2015-04-29 LAB — POC HEMOCCULT BLD/STL (OFFICE/1-CARD/DIAGNOSTIC): Fecal Occult Blood, POC: NEGATIVE

## 2015-04-29 MED ORDER — PROMETHAZINE HCL 25 MG/ML IJ SOLN
25.0000 mg | Freq: Once | INTRAMUSCULAR | Status: AC
  Administered 2015-04-29: 25 mg via INTRAMUSCULAR

## 2015-04-29 MED ORDER — PROMETHAZINE HCL 25 MG PO TABS
25.0000 mg | ORAL_TABLET | Freq: Four times a day (QID) | ORAL | Status: DC | PRN
Start: 2015-04-29 — End: 2015-10-10

## 2015-04-29 MED ORDER — SUCRALFATE 1 G PO TABS: 1.0000 g | ORAL_TABLET | Freq: Three times a day (TID) | ORAL | Status: DC

## 2015-04-29 MED ORDER — GI COCKTAIL ~~LOC~~
30.0000 mL | Freq: Once | ORAL | Status: AC
  Administered 2015-04-29: 30 mL via ORAL

## 2015-04-29 NOTE — Progress Notes (Signed)
Subjective:    Patient ID: [name], female    DOB: [birth year], 54 y.o.   MRN: [id number]  HPI  This is a 54 year old female who is presenting with 2 weeks of hemorrhoid flare and 4 days of bloody stool, emesis, and epigastric abdominal pain. Reports she is having "dark, bloody, tarry stools" every few hours. Having burning epigastric pain and states "I can't keep anything down". She is no longer vomiting, just dry-heaving. No blood in vomit. Denies fever, chills, urinary symptoms or back pain. She tried prilosec without help. She has never had this type of stool or abdominal pain before. She has had problems with hemorrhoids for several years. Says she has an appt scheduled with a hemorrhoid specialist in the upcoming few months. Reports she has had several abdominal surgeries in the past. She has had several laparoscopies for endometriosis, umbilical hernia repair, total hysterectomy. Had her first colonoscopy at age 58 d/t flex sigmoidoscopy for endometriosis and polyps found. She had a colonoscopy annually for many years. Now she gets colonoscopies every 5 years - last one 4 years ago and normal. She sees Dr. Rochele Pages for her colonoscopies. Her main concern is that her daughter graduates from [employer] tomorrow and she states "I just need something to get me through the next few days". Lots of family are coming into town today.   Pt takes an aleve once a day, no other NSAIDs. She drinks 7 diet sodas a day. Rare alcohol consumption.  Review of Systems  Constitutional: Negative for fever and chills.  Gastrointestinal: Positive for nausea, vomiting, abdominal pain and blood in stool. Negative for diarrhea.  Genitourinary: Negative for dysuria, hematuria, vaginal bleeding, vaginal discharge, vaginal pain and pelvic pain.  Musculoskeletal: Negative for back pain.  Skin: Negative for rash.  Neurological: Positive for headaches.  Hematological: Negative for adenopathy.   Patient Active Problem  List   Diagnosis Date Noted  . Mixed incontinence 08/05/2014  . HTN (hypertension) 02/07/2012  . Depression 02/07/2012  . Dyslipidemia 02/07/2012  . Menopausal and perimenopausal disorder 02/07/2012   Prior to Admission medications   Medication Sig Start Date End Date Taking? Authorizing Provider  ALPRAZolam Duanne Moron) 0.5 MG tablet Take 0.5 mg by mouth 2 (two) times daily as needed. For anxiety    Yes Historical Provider, MD  Biotin (BIOTIN 5000) 5 MG CAPS Take 1 capsule by mouth 2 (two) times daily.   Yes Historical Provider, MD  estradiol (ESTRACE) 1 MG tablet Take 1 tablet (1 mg total) by mouth every morning. 08/05/14  Yes Brook E Yisroel Ramming, MD  losartan (COZAAR) 100 MG tablet Take 100 mg by mouth every morning.     Yes Historical Provider, MD  sertraline (ZOLOFT) 20 MG/ML concentrated solution Take 10 mg by mouth daily.   Yes Historical Provider, MD  triamterene-hydrochlorothiazide (MAXZIDE-25) 37.5-25 MG per tablet Take 1 tablet by mouth every morning.     Yes Historical Provider, MD  TURMERIC PO Take 1 tablet by mouth 2 (two) times daily.   Yes Historical Provider, MD  zolpidem (AMBIEN) 5 MG tablet Take 5 mg by mouth at bedtime as needed for sleep.   Yes Historical Provider, MD                                      Allergies  Allergen Reactions  . Penicillins Hives   Patient's social and family  history were reviewed.     Objective:   Physical Exam  Constitutional: She is oriented to person, place, and time. She appears well-developed and well-nourished. No distress.  HENT:  Head: Normocephalic and atraumatic.  Right Ear: Hearing normal.  Left Ear: Hearing normal.  Nose: Nose normal.  Eyes: Conjunctivae and lids are normal. Right eye exhibits no discharge. Left eye exhibits no discharge. No scleral icterus.  Cardiovascular: Regular rhythm, normal heart sounds and normal pulses.   No murmur heard. Mildly tachycardic to 107  Pulmonary/Chest: Effort normal and breath  sounds normal. No respiratory distress. She has no wheezes. She has no rhonchi. She has no rales.  Abdominal: Soft. Normal appearance. There is tenderness in the epigastric area. There is no CVA tenderness and negative Murphy's sign.  Palpation over epigastric region caused her to gag  Genitourinary: Rectal exam shows external hemorrhoid (circumferential around anal sphincter).  External hemorrhoids non-bloody and not thrombosed Insertion of finger into anus produced moderate pain Stool on finger light brown, nonbloody No frank blood  Musculoskeletal: Normal range of motion.  Neurological: She is alert and oriented to person, place, and time.  Skin: Skin is warm, dry and intact. No lesion and no rash noted.  Psychiatric: She has a normal mood and affect. Her speech is normal and behavior is normal. Thought content normal.   BP 146/88 mmHg  Pulse 107  Temp(Src) 97.9 F (36.6 C) (Oral)  Resp 20  Ht 5\' 5"  (1.651 m)  Wt 164 lb 8 oz (74.617 kg)  BMI 27.37 kg/m2  SpO2 98%  Results for orders placed or performed in visit on 04/29/15  POC Hemoccult Bld/Stl (1-Cd Office Dx)  Result Value Ref Range   Card #1 Date 04/29/2015    Fecal Occult Blood, POC Negative       Assessment & Plan:  1. Bloody stool 2. Epigastric pain 3. Non-intractable vomiting with nausea Gastric ulcer likely given symptoms. Hemoccult negative. IV unable to be placed after 2 attempts. Pt did not want a third attempt and did not want blood drawn. She states "just give me something to get me through the next 2 days". GI cocktail and phenergan given in office, her dad here to drive her home. She will take sucralfate QID and prilosec QD. Phenergan for prn nausea/vomiting. She states she will make appt with her gastroenterologist for blood work/evaluation. Advised she avoid aleve and soda in the meantime.  - POC Hemoccult Bld/Stl (1-Cd Office Dx) - gi cocktail (Maalox,Lidocaine,Donnatal); Take 30 mLs by mouth once. -  sucralfate (CARAFATE) 1 G tablet; Take 1 tablet (1 g total) by mouth 4 (four) times daily -  with meals and at bedtime.  Dispense: 60 tablet; Refill: 0 - promethazine (PHENERGAN) 25 MG tablet; Take 1 tablet (25 mg total) by mouth every 6 (six) hours as needed for nausea or vomiting.  Dispense: 30 tablet; Refill: 0 - promethazine (PHENERGAN) injection 25 mg; Inject 1 mL (25 mg total) into the muscle once.   Benjaman Pott Drenda Freeze, MHS [facility]  04/29/2015

## 2015-04-29 NOTE — Patient Instructions (Addendum)
Take sucralfate four times a day on an empty stomach. Take prilosec once a day. Take phenergan as needed for N/V. Follow up with GI - you most likely need an upper endoscopy. Try your best to keep hydrated - this is the most important thing.

## 2015-06-06 ENCOUNTER — Ambulatory Visit (INDEPENDENT_AMBULATORY_CARE_PROVIDER_SITE_OTHER): Payer: 59 | Admitting: Urgent Care

## 2015-06-06 VITALS — BP 160/100 | HR 77 | Temp 98.4°F | Resp 18 | Ht 65.0 in | Wt 166.6 lb

## 2015-06-06 DIAGNOSIS — J988 Other specified respiratory disorders: Secondary | ICD-10-CM

## 2015-06-06 DIAGNOSIS — R112 Nausea with vomiting, unspecified: Secondary | ICD-10-CM | POA: Diagnosis not present

## 2015-06-06 DIAGNOSIS — R05 Cough: Secondary | ICD-10-CM

## 2015-06-06 DIAGNOSIS — I10 Essential (primary) hypertension: Secondary | ICD-10-CM | POA: Diagnosis not present

## 2015-06-06 DIAGNOSIS — J22 Unspecified acute lower respiratory infection: Secondary | ICD-10-CM

## 2015-06-06 DIAGNOSIS — R059 Cough, unspecified: Secondary | ICD-10-CM

## 2015-06-06 DIAGNOSIS — R0789 Other chest pain: Secondary | ICD-10-CM | POA: Diagnosis not present

## 2015-06-06 LAB — POCT CBC
Granulocyte percent: 80.1 %G — AB (ref 37–80)
HCT, POC: 39.9 % (ref 37.7–47.9)
Hemoglobin: 13 g/dL (ref 12.2–16.2)
Lymph, poc: 1.4 (ref 0.6–3.4)
MCH, POC: 32.7 pg — AB (ref 27–31.2)
MCHC: 32.7 g/dL (ref 31.8–35.4)
MCV: 100.2 fL — AB (ref 80–97)
MID (cbc): 0.3 (ref 0–0.9)
MPV: 6.3 fL (ref 0–99.8)
POC Granulocyte: 7 — AB (ref 2–6.9)
POC LYMPH PERCENT: 15.9 %L (ref 10–50)
POC MID %: 4 %M (ref 0–12)
Platelet Count, POC: 270 10*3/uL (ref 142–424)
RBC: 3.98 M/uL — AB (ref 4.04–5.48)
RDW, POC: 14.4 %
WBC: 8.7 10*3/uL (ref 4.6–10.2)

## 2015-06-06 MED ORDER — AZITHROMYCIN 500 MG PO TABS: 500.0000 mg | ORAL_TABLET | Freq: Every day | ORAL | Status: DC

## 2015-06-06 MED ORDER — SUCRALFATE 1 G PO TABS: 1.0000 g | ORAL_TABLET | Freq: Three times a day (TID) | ORAL | Status: DC

## 2015-06-06 MED ORDER — ALBUTEROL SULFATE HFA 108 (90 BASE) MCG/ACT IN AERS: 2.0000 | INHALATION_SPRAY | RESPIRATORY_TRACT | Status: DC | PRN

## 2015-06-06 NOTE — Progress Notes (Signed)
MRN: [id number] DOB: 1961-03-23  Subjective:   [name] is a 54 y.o. female presenting for chief complaint of Sore Throat; Shortness of Breath; and Cough  Reports 4 day history worsening productive cough worse at night while laying down, chest tightness, feeling shob, sore throat. Has tried Delsym without relief. Denies fevers, sinus headache, itchy or watery red eyes, ear pain, ear drainage, sinus pain, tooth pain, chest pain, nausea, vomiting, abdominal pain. 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 Smokes occasional cigarette. Denies history of asthma. Denies any other aggravating or relieving factors, no other questions or concerns.  [name] has a current medication list which includes the following prescription(s): alprazolam, biotin, estradiol, losartan, methocarbamol, promethazine, sertraline, sucralfate, triamterene-hydrochlorothiazide, turmeric, and zolpidem. She is allergic to penicillins.  [name]  has a past medical history of Hypertension; Hyperlipidemia; PVC (premature ventricular contraction); Anemia; Endometriosis; Urinary incontinence; Bulging discs; and Blood transfusion without reported diagnosis (01/1999). Also  has past surgical history that includes Lumbar fusion (2010); Hernia repair; Abdominal hysterectomy (2001); Bladder suspension (2003); Breast surgery; and Spine surgery.  ROS As in subjective.  Objective:   Vitals: BP 160/100 mmHg  Pulse 77  Temp(Src) 98.4 F (36.9 C) (Oral)  Resp 18  Ht 5\' 5"  (1.651 m)  Wt 166 lb 9.6 oz (75.569 kg)  BMI 27.72 kg/m2  SpO2 96%  Physical Exam  Constitutional: She appears well-developed and well-nourished.  HENT:  TM's intact bilaterally, no effusions or erythema. Nares patent, nasal turbinates pink and moist. No sinus tenderness. Oropharynx clear, mucous membranes moist, dentition in good repair.  Cardiovascular: Normal rate, regular rhythm and intact  distal pulses.  Exam reveals no gallop and no friction rub.   No murmur heard. Pulmonary/Chest: No respiratory distress. She has no wheezes. She has rales (coarse lung sounds on expiration worse with coughing).  Skin: Skin is warm and dry. No rash noted. No erythema. No pallor.   Results for orders placed or performed in visit on 06/06/15 (from the past 24 hour(s))  POCT CBC     Status: Abnormal   Collection Time: 06/06/15  1:12 PM  Result Value Ref Range   WBC 8.7 4.6 - 10.2 K/uL   Lymph, poc 1.4 0.6 - 3.4   POC LYMPH PERCENT 15.9 10 - 50 %L   MID (cbc) 0.3 0 - 0.9   POC MID % 4.0 0 - 12 %M   POC Granulocyte 7.0 (A) 2 - 6.9   Granulocyte percent 80.1 (A) 37 - 80 %G   RBC 3.98 (A) 4.04 - 5.48 M/uL   Hemoglobin 13.0 12.2 - 16.2 g/dL   HCT, POC 39.9 37.7 - 47.9 %   MCV 100.2 (A) 80 - 97 fL   MCH, POC 32.7 (A) 27 - 31.2 pg   MCHC 32.7 31.8 - 35.4 g/dL   RDW, POC 14.4 %   Platelet Count, POC 270 142 - 424 K/uL   MPV 6.3 0 - 99.8 fL   Assessment and Plan :   1. Cough 2. Chest tightness 3. Lower respiratory tract infection - Will cover for infectious process given frequent exposure to sick contacts in hospital setting. Symptomatic relief with albuterol inhaler. Return to clinic in one week if symptoms fail to improve, patient declined chest x-ray today but I advised her that this be necessary she doesn't get better.  4. Uncontrolled hypertension - Reports that she has forgotten to take her blood pressure medication in  the past 2 days. I recommend patient restart meds and be consistent with it to avoid any complications from uncontrolled hypertension. - Check in with PCP for followup on hypertension  5. Non-intractable vomiting with nausea, vomiting of unspecified type - Refilled carafate, referred to GI for further evaluation since patient has not scheduled appointment herself as planned during her last visit in 04/2015  Jaynee Eagles, PA-C [facility] [phone] 06/06/2015 12:43 PM

## 2015-08-05 ENCOUNTER — Other Ambulatory Visit: Payer: Self-pay | Admitting: Gastroenterology

## 2015-08-05 DIAGNOSIS — R1013 Epigastric pain: Secondary | ICD-10-CM

## 2015-08-05 DIAGNOSIS — R52 Pain, unspecified: Secondary | ICD-10-CM

## 2015-08-09 ENCOUNTER — Ambulatory Visit
Admission: RE | Admit: 2015-08-09 | Discharge: 2015-08-09 | Disposition: A | Discharge status: Home / Self Care | Payer: 59 | Source: Ambulatory Visit | Attending: Gastroenterology | Admitting: Gastroenterology

## 2015-08-09 DIAGNOSIS — R1013 Epigastric pain: Secondary | ICD-10-CM

## 2015-08-11 ENCOUNTER — Other Ambulatory Visit: Payer: Self-pay | Admitting: Obstetrics and Gynecology

## 2015-08-11 NOTE — Telephone Encounter (Signed)
Note corrected

## 2015-08-11 NOTE — Telephone Encounter (Signed)
Last annual exam was 08/05/14.  OK for 90 day refill of Estrace.  I will sign the order now.

## 2015-08-11 NOTE — Telephone Encounter (Addendum)
Medication refill request:  Last AEX:  08/05/14 with BS Next AEX:  10/10/15 with BS (earliest available) Last MMG (if hormonal medication request): 01/12/15 breast density category b; bi-rads 1: negative Refill authorized: #[id number]

## 2015-09-18 ENCOUNTER — Other Ambulatory Visit: Payer: Self-pay | Admitting: Obstetrics and Gynecology

## 2015-10-10 ENCOUNTER — Encounter: Payer: Self-pay | Admitting: Obstetrics and Gynecology

## 2015-10-10 ENCOUNTER — Ambulatory Visit (INDEPENDENT_AMBULATORY_CARE_PROVIDER_SITE_OTHER): Payer: 59 | Admitting: Obstetrics and Gynecology

## 2015-10-10 VITALS — BP 142/88 | HR 76 | Resp 22 | Ht 63.5 in | Wt 170.6 lb

## 2015-10-10 DIAGNOSIS — Z01419 Encounter for gynecological examination (general) (routine) without abnormal findings: Secondary | ICD-10-CM | POA: Diagnosis not present

## 2015-10-10 MED ORDER — ESTRADIOL 1 MG PO TABS: 1.0000 mg | ORAL_TABLET | Freq: Every morning | ORAL | Status: DC

## 2015-10-10 NOTE — Progress Notes (Signed)
Patient ID: [name], female   DOB: 1961/06/25, 54 y.o.   MRN: [id number] 54 y.o. [id number] Divorced Caucasian female here for annual exam.    Wants to continue with estrogen.  Occasional hot flashes.   No change in urgency or frequency.  Not a big issue.   Just ended a 2 year relationship.  Emotional about this but states she is OK. Declines STD testing.   PCP:  Derinda Late, MD   No LMP recorded. Patient has had a hysterectomy.          Sexually active: Yes.   female The current method of family planning is status post hysterectomy.    Exercising: No.   Smoker:  Yes, occasionally with friends  Health Mainntenance Pap:  2013 normal History of abnormal Pap:  Yes, Hx of cryotherapy to cervix in 1995. MMG:  01-10-15 Saline Implants/3D Density Cat.B/Neg/Bi Rads 1:[facility] Colonoscopy:  01/2012 polyps with Dr. Earlean Shawl.Scheduled for colonoscopy 10-24-15 with Dr. Stacie Glaze GI).   BMD:   2011 Result  normal TDaP:  Up to date through work Screening Labs:  Hb today: PCP, Urine today: PCP   reports that she has been smoking Cigarettes.  She does not have any smokeless tobacco history on file. She reports that she drinks about 3.0 oz of alcohol per week. She reports that she does not use illicit drugs.  Past Medical History  Diagnosis Date  . Hypertension   . Hyperlipidemia   . PVC (premature ventricular contraction)   . Anemia   . Endometriosis   . Urinary incontinence   . Bulging discs   . Blood transfusion without reported diagnosis 01/1999    following hysterectomy    Past Surgical History  Procedure Laterality Date  . Lumbar fusion  2010    Dr. Carloyn Manner  . Hernia repair    . Abdominal hysterectomy  2001    TAH/RSO  . Bladder suspension  2003    [facility] and [facility]cele [facility] at [facility]  . Spine surgery    . Breast surgery      breast augmentation--saline implants    Current Outpatient Prescriptions  Medication Sig Dispense Refill  . albuterol (PROVENTIL HFA;VENTOLIN  HFA) 108 (90 BASE) MCG/ACT inhaler Inhale 2 puffs into the lungs every 4 (four) hours as needed for wheezing or shortness of breath (cough, shortness of breath or wheezing.). 1 Inhaler 1  . ALPRAZolam (XANAX) 0.5 MG tablet Take 0.5 mg by mouth 2 (two) times daily as needed. For anxiety     . Biotin (BIOTIN 5000) 5 MG CAPS Take 1 capsule by mouth 2 (two) times daily.    Marland Kitchen estradiol (ESTRACE) 1 MG tablet TAKE ONE TABLET BY MOUTH EVERY MORNING 90 tablet 0  . hydrochlorothiazide (MICROZIDE) 12.5 MG capsule Take 1 capsule by mouth daily.  0  . losartan (COZAAR) 100 MG tablet Take 100 mg by mouth every morning.      . sertraline (ZOLOFT) 20 MG/ML concentrated solution Take 10 mg by mouth daily.    . sucralfate (CARAFATE) 1 G tablet Take 1 tablet (1 g total) by mouth 4 (four) times daily -  with meals and at bedtime. 60 tablet 0  . triamterene-hydrochlorothiazide (MAXZIDE-25) 37.5-25 MG per tablet Take 1 tablet by mouth every morning.      . TURMERIC PO Take 1 tablet by mouth 2 (two) times daily.    Marland Kitchen zolpidem (AMBIEN) 5 MG tablet Take 5 mg by mouth at bedtime as needed for sleep.  No current facility-administered medications for this visit.    Family History  Problem Relation Age of Onset  . Hypertension Mother   . Hypertension Father   . Cancer Father   . Hyperlipidemia Sister   . Hypertension Sister   . Hyperlipidemia Brother   . Hypertension Brother   . Cancer Maternal Uncle 72    colon ca  . Breast cancer Maternal Grandmother   . Cancer Maternal Grandmother 63    colon ca  . Hypertension Brother   . Hypertension Brother     ROS:  Pertinent items are noted in HPI.  Otherwise, a comprehensive ROS was negative.  Exam:   BP 142/88 mmHg  Pulse 76  Resp 22  Ht 5' 3.5" (1.613 m)  Wt 170 lb 9.6 oz (77.384 kg)  BMI 29.74 kg/m2    General appearance: alert, cooperative and appears stated age Head: Normocephalic, without obvious abnormality, atraumatic Neck: no adenopathy, supple,  symmetrical, trachea midline and thyroid normal to inspection and palpation Lungs: clear to auscultation bilaterally Breasts: normal appearance, no masses or tenderness, Inspection negative, No nipple retraction or dimpling, No nipple discharge or bleeding, No axillary or supraclavicular adenopathy.  Consistent with implants. Heart: regular rate and rhythm Abdomen: soft, non-tender; bowel sounds normal; no masses,  no organomegaly Extremities: extremities normal, atraumatic, no cyanosis or edema Skin: Skin color, texture, turgor normal. No rashes or lesions Lymph nodes: Cervical, supraclavicular, and axillary nodes normal. No abnormal inguinal nodes palpated Neurologic: Grossly normal  Pelvic: External genitalia:  no lesions              Urethra:  normal appearing urethra with no masses, tenderness or lesions              Bartholins and Skenes: normal                 Vagina: normal appearing vagina with normal color and discharge, no lesions              Cervix: absent              Pap taken: No. Bimanual Exam:  Uterus:  uterus absent              Adnexa: no mass, fullness, tenderness              Rectovaginal: Yes.  .  Confirms.              Anus:  normal sphincter tone, no lesions  Chaperone was present for exam.  Assessment:   Well woman visit with normal exam. Status post TAH/RSO. Status post LSO/sacrocolpopexy.  Status post midurethral sling.  ERT patient.  Urinary urgency and frequency.   Plan: Yearly mammogram recommended after age 80.  Recommended self breast exam.  Pap and HR HPV as above. Discussed Calcium, Vitamin D, regular exercise program including cardiovascular and weight bearing exercise. Labs performed.  No..   See orders. Refills given on medications.  Yes.  .  See orders.  Estrace 1 mg daily.  Discussed risks of DVT, PE, MI, stroke, breast cancer.  Discussed the overall goal to start weaning down estrogen dosage next year.  Declines Rx for overactive bladder  or referral to physical therapy.  Follow up annually and prn.     After visit summary provided.

## 2015-10-10 NOTE — Patient Instructions (Signed)

## 2015-10-24 ENCOUNTER — Other Ambulatory Visit: Payer: Self-pay | Admitting: Gastroenterology

## 2016-03-17 ENCOUNTER — Ambulatory Visit (INDEPENDENT_AMBULATORY_CARE_PROVIDER_SITE_OTHER): Payer: 59

## 2016-03-17 ENCOUNTER — Ambulatory Visit (INDEPENDENT_AMBULATORY_CARE_PROVIDER_SITE_OTHER): Payer: 59 | Admitting: Family Medicine

## 2016-03-17 VITALS — BP 104/70 | HR 75 | Temp 98.7°F | Resp 16 | Ht 63.5 in | Wt 155.0 lb

## 2016-03-17 DIAGNOSIS — R059 Cough, unspecified: Secondary | ICD-10-CM

## 2016-03-17 DIAGNOSIS — R05 Cough: Secondary | ICD-10-CM

## 2016-03-17 LAB — POCT CBC
Granulocyte percent: 63.8 %G (ref 37–80)
HCT, POC: 34.8 % — AB (ref 37.7–47.9)
Hemoglobin: 12.8 g/dL (ref 12.2–16.2)
Lymph, poc: 0.8 (ref 0.6–3.4)
MCH, POC: 36 pg — AB (ref 27–31.2)
MCHC: 36.8 g/dL — AB (ref 31.8–35.4)
MCV: 97.6 fL — AB (ref 80–97)
MID (cbc): 0.4 (ref 0–0.9)
MPV: 7 fL (ref 0–99.8)
POC Granulocyte: 2 (ref 2–6.9)
POC LYMPH PERCENT: 24 %L (ref 10–50)
POC MID %: 12.2 %M — AB (ref 0–12)
Platelet Count, POC: 197 10*3/uL (ref 142–424)
RBC: 3.56 M/uL — AB (ref 4.04–5.48)
RDW, POC: 13.3 %
WBC: 3.2 10*3/uL — AB (ref 4.6–10.2)

## 2016-03-17 MED ORDER — OSELTAMIVIR PHOSPHATE 75 MG PO CAPS: 75.0000 mg | ORAL_CAPSULE | Freq: Two times a day (BID) | ORAL | Status: DC

## 2016-03-17 MED ORDER — HYDROCOD POLST-CPM POLST ER 10-8 MG/5ML PO SUER: 5.0000 mL | Freq: Two times a day (BID) | ORAL | Status: DC | PRN

## 2016-03-17 NOTE — Patient Instructions (Addendum)
Your lab tests strongly suggest that you have the flu. I'm giving you an antiviral medicine even though it's late in the game to see if we can turn this around.  In the meantime, I'm also ordering a cough syrup that should help with the cough and I want you to use your inhaler 4 times a day.

## 2016-03-17 NOTE — Progress Notes (Signed)
Subjective:    Patient ID: [name], female    DOB: 09-13-1961, 55 y.o.   MRN: [id number] By signing my name below, I, Soijett Blue, attest that this documentation has been prepared under the direction and in the presence of Robyn Haber, MD. Electronically Signed: Soijett Blue, ED Scribe. 03/17/2016. 10:14 AM.  Chief Complaint  Patient presents with  . Nasal Congestion    x 10 days - has been taking a zpak w/ no relief, finished this morning   . Fever  . Cough    Productive, greenish mucous   . Shortness of Breath    HPI  [name] is a 55 y.o. female with a PMHx of HTN, hyperlipidemia, who presents to [facility] complaining of productive cough x green sputum onset 10 days. Pt notes that she was started on ceftin and azithromycin for her symptoms by her pcp with her last dose being this morning. Pt PCP is Dr. Sandi Mariscal and he is who Rx the medications for her.  Pt denies sick contacts at this time. She states that she is having associated symptoms of diarrhea, vomiting, SOB, and nasal congestion. She states that she has tried ceftin and azithromycin with no relief for her symptoms. She denies  any other symptoms. Denies PMHx of asthma, but notes that she is a social cigarette smoker.   Pt works at [employer]. Pt PCP is Dr. Sandi Mariscal  Past Medical History  Diagnosis Date  . Hypertension   . Hyperlipidemia   . PVC (premature ventricular contraction)   . Anemia   . Endometriosis   . Urinary incontinence   . Bulging discs   . Blood transfusion without reported diagnosis 01/1999    following hysterectomy    Allergies  Allergen Reactions  . Ceftin [Cefuroxime] Nausea And Vomiting  . Penicillins Hives   Current Outpatient Prescriptions on File Prior to Visit  Medication Sig Dispense Refill  . albuterol (PROVENTIL HFA;VENTOLIN HFA) 108 (90 BASE) MCG/ACT inhaler Inhale 2 puffs into the lungs every 4 (four) hours as needed for wheezing or shortness of breath  (cough, shortness of breath or wheezing.). 1 Inhaler 1  . ALPRAZolam (XANAX) 0.5 MG tablet Take 0.5 mg by mouth 2 (two) times daily as needed. For anxiety     . Biotin (BIOTIN 5000) 5 MG CAPS Take 1 capsule by mouth 2 (two) times daily.    Marland Kitchen estradiol (ESTRACE) 1 MG tablet Take 1 tablet (1 mg total) by mouth every morning. 90 tablet 3  . losartan (COZAAR) 100 MG tablet Take 100 mg by mouth every morning.      . sertraline (ZOLOFT) 20 MG/ML concentrated solution Take 10 mg by mouth daily.    Marland Kitchen triamterene-hydrochlorothiazide (MAXZIDE-25) 37.5-25 MG per tablet Take 1 tablet by mouth every morning.      . TURMERIC PO Take 1 tablet by mouth 2 (two) times daily.    . hydrochlorothiazide (MICROZIDE) 12.5 MG capsule Take 1 capsule by mouth daily. Reported on 03/17/2016  0  . sucralfate (CARAFATE) 1 G tablet Take 1 tablet (1 g total) by mouth 4 (four) times daily -  with meals and at bedtime. (Patient not taking: Reported on 03/17/2016) 60 tablet 0  . zolpidem (AMBIEN) 5 MG tablet Take 5 mg by mouth at bedtime as needed for sleep. Reported on 03/17/2016     No current facility-administered medications on file prior to visit.      Review of Systems  Constitutional: Negative for fever and chills.  HENT: Positive for congestion.   Respiratory: Positive for cough and shortness of breath.   Gastrointestinal: Positive for vomiting and diarrhea.  All other systems reviewed and are negative.      Objective:   Physical Exam  Constitutional: She is oriented to person, place, and time. She appears well-developed and well-nourished. No distress.  HENT:  Head: Normocephalic and atraumatic.  Mouth/Throat: Posterior oropharyngeal erythema present.  Eyes: EOM are normal.  Neck: Neck supple.  Cardiovascular: Normal rate, regular rhythm and normal heart sounds.   Pulmonary/Chest: Effort normal. No respiratory distress. She has wheezes.  Coarse breath sounds bilaterally with faint expiratory wheezes.     Musculoskeletal: Normal range of motion.  Neurological: She is alert and oriented to person, place, and time.  Skin: Skin is warm and dry.  Psychiatric: She has a normal mood and affect. Her behavior is normal.  Nursing note and vitals reviewed.  Results for orders placed or performed in visit on 03/17/16  POCT CBC  Result Value Ref Range   WBC 3.2 (A) 4.6 - 10.2 K/uL   Lymph, poc 0.8 0.6 - 3.4   POC LYMPH PERCENT 24.0 10 - 50 %L   MID (cbc) 0.4 0 - 0.9   POC MID % 12.2 (A) 0 - 12 %M   POC Granulocyte 2.0 2 - 6.9   Granulocyte percent 63.8 37 - 80 %G   RBC 3.56 (A) 4.04 - 5.48 M/uL   Hemoglobin 12.8 12.2 - 16.2 g/dL   HCT, POC 34.8 (A) 37.7 - 47.9 %   MCV 97.6 (A) 80 - 97 fL   MCH, POC 36.0 (A) 27 - 31.2 pg   MCHC 36.8 (A) 31.8 - 35.4 g/dL   RDW, POC 13.3 %   Platelet Count, POC 197 142 - 424 K/uL   MPV 7.0 0 - 99.8 fL   Chest x-ray shows no infiltrate      BP 104/70 mmHg  Pulse 75  Temp(Src) 98.7 F (37.1 C) (Oral)  Resp 16  Ht 5' 3.5" (1.613 m)  Wt 155 lb (70.308 kg)  BMI 27.02 kg/m2  SpO2 98%  Assessment & Plan:   The white count suggests patient has a viral infection. Judging by her appearance, patient has the flu.  This chart was scribed in my presence and reviewed by me personally.    ICD-9-CM ICD-10-CM   1. Cough 786.2 R05 POCT CBC     DG Chest 2 View     oseltamivir (TAMIFLU) 75 MG capsule     chlorpheniramine-HYDROcodone (TUSSIONEX PENNKINETIC ER) 10-8 MG/5ML SUER     Signed, Robyn Haber, MD

## 2016-10-19 ENCOUNTER — Ambulatory Visit (INDEPENDENT_AMBULATORY_CARE_PROVIDER_SITE_OTHER): Payer: BLUE CROSS/BLUE SHIELD | Admitting: Physician Assistant

## 2016-10-19 VITALS — BP 140/90 | HR 96 | Temp 98.1°F | Resp 16 | Ht 63.5 in | Wt 141.0 lb

## 2016-10-19 DIAGNOSIS — J3489 Other specified disorders of nose and nasal sinuses: Secondary | ICD-10-CM | POA: Diagnosis not present

## 2016-10-19 DIAGNOSIS — R52 Pain, unspecified: Secondary | ICD-10-CM | POA: Diagnosis not present

## 2016-10-19 DIAGNOSIS — R05 Cough: Secondary | ICD-10-CM

## 2016-10-19 DIAGNOSIS — R059 Cough, unspecified: Secondary | ICD-10-CM

## 2016-10-19 LAB — POCT INFLUENZA A/B
Influenza A, POC: NEGATIVE
Influenza B, POC: NEGATIVE

## 2016-10-19 LAB — POCT CBC
Granulocyte percent: 73.6 %G (ref 37–80)
HCT, POC: 33.2 % — AB (ref 37.7–47.9)
Hemoglobin: 11.8 g/dL — AB (ref 12.2–16.2)
Lymph, poc: 1.6 (ref 0.6–3.4)
MCH, POC: 34.3 pg — AB (ref 27–31.2)
MCHC: 35.5 g/dL — AB (ref 31.8–35.4)
MCV: 96.8 fL (ref 80–97)
MID (cbc): 0.5 (ref 0–0.9)
MPV: 6.9 fL (ref 0–99.8)
POC Granulocyte: 5.7 (ref 2–6.9)
POC LYMPH PERCENT: 20.5 %L (ref 10–50)
POC MID %: 5.9 %M (ref 0–12)
Platelet Count, POC: 229 10*3/uL (ref 142–424)
RBC: 3.43 M/uL — AB (ref 4.04–5.48)
RDW, POC: 13.3 %
WBC: 7.8 10*3/uL (ref 4.6–10.2)

## 2016-10-19 MED ORDER — HYDROCOD POLST-CPM POLST ER 10-8 MG/5ML PO SUER: 5.0000 mL | Freq: Two times a day (BID) | ORAL | 0 refills | Status: DC | PRN

## 2016-10-19 MED ORDER — PREDNISONE 20 MG PO TABS: 20.0000 mg | ORAL_TABLET | Freq: Two times a day (BID) | ORAL | 0 refills | Status: AC

## 2016-10-19 MED ORDER — IPRATROPIUM BROMIDE 0.02 % IN SOLN
0.5000 mg | Freq: Once | RESPIRATORY_TRACT | Status: AC
Start: 2016-10-19 — End: 2016-10-19
  Administered 2016-10-19: 0.5 mg via RESPIRATORY_TRACT

## 2016-10-19 MED ORDER — BENZONATATE 100 MG PO CAPS: 100.0000 mg | ORAL_CAPSULE | Freq: Three times a day (TID) | ORAL | 0 refills | Status: DC | PRN

## 2016-10-19 MED ORDER — ALBUTEROL SULFATE (2.5 MG/3ML) 0.083% IN NEBU
2.5000 mg | INHALATION_SOLUTION | Freq: Once | RESPIRATORY_TRACT | Status: AC
  Administered 2016-10-19: 2.5 mg via RESPIRATORY_TRACT

## 2016-10-19 NOTE — Patient Instructions (Addendum)
Take prednisone as prescribed.   - I recommend you rest, drink plenty of fluids, eat light meals including soups.  - You may use cough syrup at night for your cough and sore throat, Tessalon pearls during the day. Be aware that cough syrup can definitely make you drowsy and sleepy so do not drive or operate any heavy machinery if it is affecting you during the day.  - You may also use Tylenol or ibuprofen over-the-counter for your sore throat.  - Please let me know if you are not seeing any improvement or get worse in 7 days.       IF you received an x-ray today, you will receive an invoice from [facility]. Please contact [facility] at [phone] with questions or concerns regarding your invoice.   IF you received labwork today, you will receive an invoice from [employer]. Please contact [facility] at [phone] with questions or concerns regarding your invoice.   Our billing staff will not be able to assist you with questions regarding bills from these companies.  You will be contacted with the lab results as soon as they are available. The fastest way to get your results is to activate your My Chart account. Instructions are located on the last page of this paperwork. If you have not heard from [place] regarding the results in 2 weeks, please contact this office.

## 2016-10-19 NOTE — Progress Notes (Signed)
MRN: [id number] DOB: [birth year]  Subjective:   [name] is a 55 y.o. female presenting for chief complaint of Nasal Congestion (X 1 week - was given a zpack w/o relief); Headache; and chest congestion  Reports seven day history of sinus headache, sinus congestion, ear fullness, and sore throat which then progressed to a productive cough (no hemoptysis), intermittent chills, shortness of breath during coughing episodes, and chest tightness three days ago. She has tried azithromycin with no relief. She just completed the course yesterday. Has also tried delsym with no relief of cough. She has used her albuterol inhaler she had from a previous coughing episode and it has provided some relief.  Denies  ear pain, wheezing, chest pain and myalgia, weight loss, nausea, vomiting, abdominal pain and diarrhea. 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 No history of seasonal allergies or history of asthma. Patient has had fllu shot this season. Denies smoking, has occasional alcohol use. Denies any other aggravating or relieving factors, no other questions or concerns.   [name] has a current medication list which includes the following prescription(s): albuterol, alprazolam, biotin, estradiol, hydrochlorothiazide, losartan, turmeric, metoprolol succinate, oxycodone-acetaminophen, rosuvastatin, and zolpidem. Also is allergic to ceftin [cefuroxime] and penicillins.  [name]  has a past medical history of Anemia; Blood transfusion without reported diagnosis (01/1999); Bulging discs; Endometriosis; Hyperlipidemia; Hypertension; PVC (premature ventricular contraction); and Urinary incontinence. Also  has a past surgical history that includes Lumbar fusion (2010); Hernia repair; Abdominal hysterectomy (2001); Bladder suspension (2003); Spine surgery; and Breast surgery.  Objective:   Vitals: BP 140/90   Pulse 96   Temp 98.1 F (36.7 C) (Oral)   Resp  16   Ht 5' 3.5" (1.613 m)   Wt 141 lb (64 kg)   SpO2 98%   BMI 24.59 kg/m   Physical Exam  Constitutional: She is oriented to person, place, and time. She appears well-developed and well-nourished.  HENT:  Head: Normocephalic and atraumatic.  Right Ear: Tympanic membrane, external ear and ear canal normal.  Left Ear: Tympanic membrane, external ear and ear canal normal.  Nose: Mucosal edema present. Right sinus exhibits maxillary sinus tenderness and frontal sinus tenderness. Left sinus exhibits maxillary sinus tenderness and frontal sinus tenderness.  Mouth/Throat: Uvula is midline and mucous membranes are normal. Posterior oropharyngeal erythema present.  Eyes: Conjunctivae are normal.  Neck: Normal range of motion.  Pulmonary/Chest: Effort normal and breath sounds normal. She has no wheezes. She has no rhonchi. She has no rales.  Coughing intermittently throughout physical exam  Neurological: She is alert and oriented to person, place, and time.  Skin: Skin is warm and dry.  Psychiatric: She has a normal mood and affect.  Vitals reviewed.   Post breathing treatment: Pt states she is feeling much better and notes that it feels as if her cough is more easily cleared after the treatment.   Results for orders placed or performed in visit on 10/19/16 (from the past 24 hour(s))  POCT CBC     Status: Abnormal   Collection Time: 10/19/16 10:08 AM  Result Value Ref Range   WBC 7.8 4.6 - 10.2 K/uL   Lymph, poc 1.6 0.6 - 3.4   POC LYMPH PERCENT 20.5 10 - 50 %L   MID (cbc) 0.5 0 - 0.9   POC MID % 5.9 0 - 12 %M   POC Granulocyte 5.7 2 - 6.9   Granulocyte percent 73.6 37 -  80 %G   RBC 3.43 (A) 4.04 - 5.48 M/uL   Hemoglobin 11.8 (A) 12.2 - 16.2 g/dL   HCT, POC 33.2 (A) 37.7 - 47.9 %   MCV 96.8 80 - 97 fL   MCH, POC 34.3 (A) 27 - 31.2 pg   MCHC 35.5 (A) 31.8 - 35.4 g/dL   RDW, POC 13.3 %   Platelet Count, POC 229 142 - 424 K/uL   MPV 6.9 0 - 99.8 fL  POCT Influenza A/B     Status:  None   Collection Time: 10/19/16 10:14 AM  Result Value Ref Range   Influenza A, POC Negative Negative   Influenza B, POC Negative Negative   Assessment and Plan :  1. Cough -Pt has just completed a zpack and her lung exam was unremarkable, will treat with course of prednisone for both cough and sinus pressure. Pt to return to clinic in one week if no improvement with regimen. If symptoms still persisting at this time, will consider CXR and alternative antibiotic.   -POCT CBC - POCT Influenza A/B - albuterol (PROVENTIL) (2.5 MG/3ML) 0.083% nebulizer solution 2.5 mg; Take 3 mLs (2.5 mg total) by nebulization once. - ipratropium (ATROVENT) nebulizer solution 0.5 mg; Take 2.5 mLs (0.5 mg total) by nebulization once. - benzonatate (TESSALON) 100 MG capsule; Take 1-2 capsules (100-200 mg total) by mouth 3 (three) times daily as needed for cough.  Dispense: 40 capsule; Refill: 0 - chlorpheniramine-HYDROcodone (TUSSIONEX PENNKINETIC ER) 10-8 MG/5ML SUER; Take 5 mLs by mouth every 12 (twelve) hours as needed for cough.  Dispense: 100 mL; Refill: 0  2. Body aches - POCT Influenza A/B  3. Sinus pressure - predniSONE (DELTASONE) 20 MG tablet; Take 1 tablet (20 mg total) by mouth 2 (two) times daily with a meal.  Dispense: 10 tablet; Refill: 0   Tenna Delaine, PA-C  [facility] 10/19/2016 10:16 AM

## 2016-11-05 NOTE — Progress Notes (Signed)
HPI: 55 year old female for evaluation of dyspnea. Echocardiogram 2006 showed normal LV systolic function, mild mitral and tricuspid regurgitation. Carotid Dopplers September 2015 showed no stenosis. 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 Over the past 5 months patient describes increased dyspnea with activities. There is also heart pounding, chest tightness and dizziness. There is no orthopnea, PND, pedal edema. She has chest tightness with activities and occasionally at rest. No radiation. No travel immediately preceding her symptoms. Toprol added approximately 3 weeks ago with some improvement in symptoms.  Current Outpatient Prescriptions  Medication Sig Dispense Refill  . albuterol (PROVENTIL HFA;VENTOLIN HFA) 108 (90 BASE) MCG/ACT inhaler Inhale 2 puffs into the lungs every 4 (four) hours as needed for wheezing or shortness of breath (cough, shortness of breath or wheezing.). 1 Inhaler 1  . ALPRAZolam (XANAX) 0.5 MG tablet Take 0.5 mg by mouth 2 (two) times daily as needed. For anxiety     . chlorpheniramine-HYDROcodone (TUSSIONEX PENNKINETIC ER) 10-8 MG/5ML SUER Take 5 mLs by mouth every 12 (twelve) hours as needed for cough. 100 mL 0  . estradiol (ESTRACE) 1 MG tablet Take 1 tablet (1 mg total) by mouth every morning. 90 tablet 3  . hydrochlorothiazide (MICROZIDE) 12.5 MG capsule Take 1 capsule by mouth daily. Reported on 03/17/2016  0  . losartan (COZAAR) 100 MG tablet Take 100 mg by mouth every morning.      . metoprolol succinate (TOPROL-XL) 25 MG 24 hr tablet     . oxyCODONE-acetaminophen (PERCOCET/ROXICET) 5-325 MG tablet     . TURMERIC PO Take 1 tablet by mouth 2 (two) times daily.    Marland Kitchen zolpidem (AMBIEN) 10 MG tablet TAKE 1 TABLET BY MOUTH AT BEDTIME (INSURANCE ONLY ALLOWS 15 PER MONTH)  0   No current facility-administered medications for this visit.     Allergies  Allergen Reactions  . Ceftin [Cefuroxime] Nausea And Vomiting  . Penicillins Hives     Past Medical  History:  Diagnosis Date  . Adenomatous polyps   . Anemia   . Blood transfusion without reported diagnosis 01/1999   following hysterectomy  . Bulging discs   . Endometriosis   . Hyperlipidemia   . Hypertension   . PVC (premature ventricular contraction)   . Urinary incontinence     Past Surgical History:  Procedure Laterality Date  . ABDOMINAL HYSTERECTOMY  2001   TAH/RSO  . BLADDER SUSPENSION  2003   LSO and rectocele repair at [facility]  . BREAST SURGERY     breast augmentation--saline implants  . HERNIA REPAIR    . LUMBAR FUSION  2010   Dr. Carloyn Manner  . SPINE SURGERY      Social History   Social History  . Marital status: Divorced    Spouse name: N/A  . Number of children: 3  . Years of education: N/A   Occupational History  . Not on file.   Social History Main Topics  . Smoking status: Current Every Day Smoker    Types: Cigarettes  . Smokeless tobacco: Never Used     Comment: [email or web address] with friends  . Alcohol use 3.0 oz/week    5 Standard drinks or equivalent per week     Comment: 1 glass wine per night  . Drug use: No  . Sexual activity: Yes    Partners: Male    Birth control/ protection: Surgical     Comment: TAH   Other Topics Concern  . Not on file   Social History  Narrative  . No narrative on file    Family History  Problem Relation Age of Onset  . Hypertension Mother   . Hypertension Father   . Cancer Father   . Hyperlipidemia Sister   . Hypertension Sister   . Hyperlipidemia Brother   . Hypertension Brother   . Hypertension Brother   . Hypertension Brother   . Cancer Maternal Uncle 72    colon ca  . Breast cancer Maternal Grandmother   . Cancer Maternal Grandmother 71    colon ca    ROS: no fevers or chills, productive cough, hemoptysis, dysphasia, odynophagia, melena, hematochezia, dysuria, hematuria, rash, seizure activity, orthopnea, PND, pedal edema, claudication. Remaining systems are negative.  Physical Exam:   Blood  pressure 122/86, pulse 76, height 5\' 5"  (1.651 m), weight 142 lb 12.8 oz (64.8 kg).  General:  Well developed/well nourished in NAD Skin warm/dry Patient not depressed No peripheral clubbing Back-normal HEENT-normal/normal eyelids Neck supple/normal carotid upstroke bilaterally; no bruits; no JVD; no thyromegaly chest - CTA/ normal expansion CV - RRR/normal S1 and S2; no murmurs, rubs or gallops;  PMI nondisplaced Abdomen -NT/ND, no HSM, no mass, + bowel sounds, no bruit 2+ femoral pulses, no bruits Ext-no edema, chords, 2+ DP Neuro-grossly nonfocal  ECG - sinus rhythm at a rate of 76. Cannot rule out prior septal infarct.  1 chest tightness-symptoms are somewhat atypical. They occur with exertion but also at rest. Electrocardiogram shows no ST changes. I will arrange an exercise treadmill for risk stratification. Echocardiogram to evaluate LV function and wall motion.  2 dizziness-patient has exertional dizziness and heart pounding. We will arrange an exercise treadmill to evaluate her rhythm with activities. Note she had blood work drawn recently including a recent thyroid evaluation that apparently was unremarkable. We will obtain those laboratories.   3 dyspnea-echocardiogram to assess LV function. No risk factors for pulmonary and was.  4 hypertension-patient's blood pressure has been elevated. She checks this at home and at work. Toprol was added approximately 3 weeks ago with some improvement. Her blood pressure is excellent today. We will follow this and adjust regimen as needed. If her blood pressure becomes difficult to control we will consider secondary causes.  Kirk Ruths, MD

## 2016-11-06 ENCOUNTER — Telehealth: Payer: Self-pay | Admitting: Cardiology

## 2016-11-06 NOTE — Telephone Encounter (Signed)
Record received from Marylene Land MD for apt on 11/08/16 with Dr Stanford Breed. Records given to [employer] (medical records) CN

## 2016-11-08 ENCOUNTER — Ambulatory Visit (INDEPENDENT_AMBULATORY_CARE_PROVIDER_SITE_OTHER): Payer: BLUE CROSS/BLUE SHIELD | Admitting: Cardiology

## 2016-11-08 ENCOUNTER — Encounter: Payer: Self-pay | Admitting: Cardiology

## 2016-11-08 VITALS — BP 122/86 | HR 76 | Ht 65.0 in | Wt 142.8 lb

## 2016-11-08 DIAGNOSIS — R0600 Dyspnea, unspecified: Secondary | ICD-10-CM | POA: Diagnosis not present

## 2016-11-08 DIAGNOSIS — R0789 Other chest pain: Secondary | ICD-10-CM | POA: Diagnosis not present

## 2016-11-08 DIAGNOSIS — I10 Essential (primary) hypertension: Secondary | ICD-10-CM

## 2016-11-08 NOTE — Patient Instructions (Signed)
Medication Instructions:   NO CHANGE  Testing/Procedures:  Your physician has requested that you have an echocardiogram. Echocardiography is a painless test that uses sound waves to create images of your heart. It provides your doctor with information about the size and shape of your heart and how well your heart's chambers and valves are working. This procedure takes approximately one hour. There are no restrictions for this procedure.   Your physician has requested that you have an exercise tolerance test. For further information please visit [email or web address]. Please also follow instruction sheet, as given.TAKE METOPROLOL THE DAY OF THE GXT PER MD    Follow-Up:  Your physician recommends that you schedule a follow-up appointment in: 8 WEEKS WITH DR Stanford Breed    Exercise Stress Electrocardiogram An exercise stress electrocardiogram is a test to check how blood flows to your heart. It is done to find areas of poor blood flow. You will need to walk on a treadmill for this test. The electrocardiogram will record your heartbeat when you are at rest and when you are exercising. What happens before the procedure?  Do not have drinks with caffeine or foods with caffeine for 24 hours before the test, or as told by your doctor. This includes coffee, tea (even decaf tea), sodas, chocolate, and cocoa.  Follow your doctor's instructions about eating and drinking before the test.  Ask your doctor what medicines you should or should not take before the test. Take your medicines with water unless told by your doctor not to.  If you use an inhaler, bring it with you to the test.  Bring a snack to eat after the test.  Do not  smoke for 4 hours before the test.  Do not put lotions, powders, creams, or oils on your chest before the test.  Wear comfortable shoes and clothing. What happens during the procedure?  You will have patches put on your chest. Small areas of your chest may need to be  shaved. Wires will be connected to the patches.  Your heart rate will be watched while you are resting and while you are exercising.  You will walk on the treadmill. The treadmill will slowly get faster to raise your heart rate.  The test will take about 1-2 hours. What happens after the procedure?  Your heart rate and blood pressure will be watched after the test.  You may return to your normal diet, activities, and medicines or as told by your doctor. This information is not intended to replace advice given to you by your health care provider. Make sure you discuss any questions you have with your health care provider. Document Released: 05/28/2008 Document Revised: 08/08/2016 Document Reviewed: 08/17/2013 Elsevier Interactive Patient Education  2017 [employer].

## 2016-11-16 ENCOUNTER — Other Ambulatory Visit: Payer: Self-pay | Admitting: Obstetrics and Gynecology

## 2016-11-19 ENCOUNTER — Ambulatory Visit (INDEPENDENT_AMBULATORY_CARE_PROVIDER_SITE_OTHER): Payer: BLUE CROSS/BLUE SHIELD | Admitting: Urgent Care

## 2016-11-19 VITALS — BP 136/96 | HR 114 | Temp 98.7°F | Resp 16 | Ht 66.0 in | Wt 142.0 lb

## 2016-11-19 DIAGNOSIS — H00012 Hordeolum externum right lower eyelid: Secondary | ICD-10-CM

## 2016-11-19 MED ORDER — DOXYCYCLINE HYCLATE 100 MG PO CAPS: 100.0000 mg | ORAL_CAPSULE | Freq: Two times a day (BID) | ORAL | 0 refills | Status: DC

## 2016-11-19 MED ORDER — ERYTHROMYCIN 5 MG/GM OP OINT: 1.0000 "application " | TOPICAL_OINTMENT | Freq: Four times a day (QID) | OPHTHALMIC | 0 refills | Status: DC

## 2016-11-19 NOTE — Progress Notes (Signed)
    MRN: [id number] DOB: 1961/12/13  Subjective:   [name] is a 55 y.o. female presenting for chief complaint of Eye Pain (red, painful, pressure, crusted left eye this morning started last week, drops not helping )  Reports 1 week history of stye over her right eye. Has since progressed to yellow mass of her right lower eye lid. The mass is painful, draining mild amount of clear/serous fluid. She also has surrounding peri-orbital tenderness. Has started levaquin eye drops without any improvement. 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 She is supposed to wear eye glasses but does not consistently. Reports allergies to penicillins, cephalosporins.  [name] has a current medication list which includes the following prescription(s): albuterol, alprazolam, chlorpheniramine-hydrocodone, estradiol, hydrochlorothiazide, losartan, metoprolol succinate, oxycodone-acetaminophen, turmeric, and zolpidem. Also is allergic to ceftin [cefuroxime] and penicillins.  [name]  has a past medical history of Adenomatous polyps; Anemia; Blood transfusion without reported diagnosis (01/1999); Bulging discs; Endometriosis; Hyperlipidemia; Hypertension; PVC (premature ventricular contraction); and Urinary incontinence. Also  has a past surgical history that includes Lumbar fusion (2010); Hernia repair; Abdominal hysterectomy (2001); Bladder suspension (2003); Spine surgery; and Breast surgery.   Objective:   Vitals: BP (!) 136/96 (BP Location: Right Arm, Patient Position: Sitting, Cuff Size: Normal)   Pulse (!) 114   Temp 98.7 F (37.1 C) (Oral)   Resp 16   Ht 5\' 6"  (1.676 m)   Wt 142 lb (64.4 kg)   SpO2 98%   BMI 22.92 kg/m    Visual Acuity Screening   Right eye Left eye Both eyes  Without correction: 20/20 20/20 20/20   With correction:      Physical Exam  Constitutional: She is oriented to person, place, and time. She appears well-developed and well-nourished.  HENT:  TM's intact bilaterally, no  effusions or erythema. Nasal turbinates pink and moist, nasal passages patent. No sinus tenderness. Oropharynx clear, mucous membranes moist, dentition in good repair.  Eyes: EOM are normal. Pupils are equal, round, and reactive to light. Right eye exhibits no discharge. Left eye exhibits no discharge. No scleral icterus.  Right lower eyelid with draining hordeolum located laterally.  Cardiovascular: Normal rate.   Pulmonary/Chest: Effort normal.  Neurological: She is alert and oriented to person, place, and time.  Skin: Skin is warm and dry.   Assessment and Plan :   1. Hordeolum externum of right lower eyelid - Start erythromycin ointment 4 times daily. Patient is very worried that she needs aggressive treatment. She will follow up tomorrow and if no improvement, consider referral to Dr. Gershon Crane or Dr. Katy Fitch.  Becky Eagles, PA-C [facility] [phone] 11/19/2016 8:59 AM

## 2016-11-19 NOTE — Telephone Encounter (Signed)
Medication refill request: Estradiol  Last AEX:  10-10-15  Next AEX: not scheduled (spoke with patient and she is going to call and schedule MMG and call back to schedule with [place] once she gets to her calendar)   Last MMG (if hormonal medication request): 01-12-15  Refill authorized: please advise

## 2016-11-19 NOTE — Patient Instructions (Addendum)
Stye A stye is a bump on your eyelid caused by a bacterial infection. A stye can form inside the eyelid (internal stye) or outside the eyelid (external stye). An internal stye may be caused by an infected oil-producing gland inside your eyelid. An external stye may be caused by an infection at the base of your eyelash (hair follicle). Styes are very common. Anyone can get them at any age. They usually occur in just one eye, but you may have more than one in either eye. What are the causes? The infection is almost always caused by bacteria called Staphylococcus aureus. This is a common type of bacteria that lives on your skin. What increases the risk? You may be at higher risk for a stye if you have had one before. You may also be at higher risk if you have:  Diabetes.  Long-term illness.  Long-term eye redness.  A skin condition called seborrhea.  High fat levels in your blood (lipids). What are the signs or symptoms? Eyelid pain is the most common symptom of a stye. Internal styes are more painful than external styes. Other signs and symptoms may include:  Painful swelling of your eyelid.  A scratchy feeling in your eye.  Tearing and redness of your eye.  Pus draining from the stye. How is this diagnosed? Your health care provider may be able to diagnose a stye just by examining your eye. The health care provider may also check to make sure:  You do not have a fever or other signs of a more serious infection.  The infection has not spread to other parts of your eye or areas around your eye. How is this treated? Most styes will clear up in a few days without treatment. In some cases, you may need to use antibiotic drops or ointment to prevent infection. Your health care provider may have to drain the stye surgically if your stye is:  Large.  Causing a lot of pain.  Interfering with your vision. This can be done using a thin blade or a needle. Follow these instructions at  home:  Take medicines only as directed by your health care provider.  Apply a clean, warm compress to your eye for 10 minutes, 4 times a day.  Do not wear contact lenses or eye makeup until your stye has healed.  Do not try to pop or drain the stye. Contact a health care provider if:  You have chills or a fever.  Your stye does not go away after several days.  Your stye affects your vision.  Your eyeball becomes swollen, red, or painful. This information is not intended to replace advice given to you by your health care provider. Make sure you discuss any questions you have with your health care provider. Document Released: 09/19/2005 Document Revised: 08/05/2016 Document Reviewed: 03/26/2014 Elsevier Interactive Patient Education  2017 [employer].     IF you received an x-ray today, you will receive an invoice from [facility]. Please contact [facility] at [phone] with questions or concerns regarding your invoice.   IF you received labwork today, you will receive an invoice from [employer]. Please contact [facility] at [phone] with questions or concerns regarding your invoice.   Our billing staff will not be able to assist you with questions regarding bills from these companies.  You will be contacted with the lab results as soon as they are available. The fastest way to get your results is to activate your My  Chart account. Instructions are located on the last page of this paperwork. If you have not heard from [place] regarding the results in 2 weeks, please contact this office.

## 2016-11-19 NOTE — Addendum Note (Signed)
Addended by: Jaynee Eagles on: 11/19/2016 09:33 AM   Modules accepted: Orders

## 2016-11-20 ENCOUNTER — Ambulatory Visit: Payer: BLUE CROSS/BLUE SHIELD

## 2016-12-06 ENCOUNTER — Telehealth (HOSPITAL_COMMUNITY): Payer: Self-pay

## 2016-12-06 NOTE — Telephone Encounter (Signed)
Encounter complete. 

## 2016-12-07 ENCOUNTER — Telehealth (HOSPITAL_COMMUNITY): Payer: Self-pay

## 2016-12-11 ENCOUNTER — Ambulatory Visit (HOSPITAL_COMMUNITY)
Admission: RE | Admit: 2016-12-11 | Discharge: 2016-12-11 | Disposition: A | Discharge status: Home / Self Care | Payer: BLUE CROSS/BLUE SHIELD | Source: Ambulatory Visit | Attending: Cardiology | Admitting: Cardiology

## 2016-12-11 ENCOUNTER — Other Ambulatory Visit (HOSPITAL_COMMUNITY): Payer: BLUE CROSS/BLUE SHIELD

## 2016-12-11 ENCOUNTER — Other Ambulatory Visit: Payer: Self-pay

## 2016-12-11 ENCOUNTER — Ambulatory Visit (HOSPITAL_BASED_OUTPATIENT_CLINIC_OR_DEPARTMENT_OTHER): Payer: BLUE CROSS/BLUE SHIELD

## 2016-12-11 DIAGNOSIS — I071 Rheumatic tricuspid insufficiency: Secondary | ICD-10-CM | POA: Insufficient documentation

## 2016-12-11 DIAGNOSIS — R0600 Dyspnea, unspecified: Secondary | ICD-10-CM

## 2016-12-11 DIAGNOSIS — R0789 Other chest pain: Secondary | ICD-10-CM | POA: Diagnosis not present

## 2016-12-11 LAB — ECHOCARDIOGRAM COMPLETE
Ao-asc: 30 cm
E decel time: 239 msec
E/e' ratio: 8.12
FS: 38 % (ref 28–44)
IVS/LV PW RATIO, ED: 1.14
LA ID, A-P, ES: 29 mm
LA diam end sys: 29 mm
LA diam index: 1.67 cm/m2
LA vol A4C: 28 ml
LA vol index: 20.2 mL/m2
LA vol: 35 mL
LV E/e' medial: 8.12
LV E/e'average: 8.12
LV PW d: 10.3 mm — AB (ref 0.6–1.1)
LV dias vol index: 37 mL/m2
LV dias vol: 65 mL (ref 46–106)
LV e' LATERAL: 8.58 cm/s
LV sys vol index: 16 mL/m2
LV sys vol: 27 mL (ref 14–42)
LVOT SV: 126 mL
LVOT VTI: 19 cm
LVOT area: 6.61 cm2
LVOT diameter: 29 mm
LVOT peak vel: 105 cm/s
Lateral S' vel: 9.94 cm/s
MV Dec: 239
MV pk A vel: 93.8 m/s
MV pk E vel: 69.7 m/s
RV sys press: 24 mmHg
Reg peak vel: 229 cm/s
S' Lateral: 9.94 cm/s
Simpson's disk: 58
Stroke v: 38 ml
TDI e' lateral: 8.58
TDI e' medial: 6.34
TR max vel: 229 cm/s

## 2016-12-11 LAB — EXERCISE TOLERANCE TEST
Estimated workload: 7.6 METS
Exercise duration (min): 6 min
Exercise duration (sec): 26 s
MPHR: 164 {beats}/min
Peak HR: 153 {beats}/min
Percent HR: 93 %
RPE: 17
Rest HR: 81 {beats}/min

## 2016-12-24 HISTORY — PX: SPINAL FUSION: SHX223

## 2016-12-27 ENCOUNTER — Other Ambulatory Visit: Payer: Self-pay | Admitting: Neurological Surgery

## 2016-12-27 DIAGNOSIS — M5410 Radiculopathy, site unspecified: Secondary | ICD-10-CM

## 2017-01-10 ENCOUNTER — Other Ambulatory Visit: Payer: Self-pay | Admitting: Neurological Surgery

## 2017-01-10 DIAGNOSIS — M5410 Radiculopathy, site unspecified: Secondary | ICD-10-CM

## 2017-01-11 ENCOUNTER — Inpatient Hospital Stay
Admission: RE | Admit: 2017-01-11 | Discharge: 2017-01-11 | Disposition: A | Discharge status: Home / Self Care | Payer: BLUE CROSS/BLUE SHIELD | Source: Ambulatory Visit | Attending: Neurological Surgery | Admitting: Neurological Surgery

## 2017-01-11 ENCOUNTER — Other Ambulatory Visit: Payer: BLUE CROSS/BLUE SHIELD

## 2017-01-23 NOTE — Telephone Encounter (Signed)
Close encounter 

## 2017-02-04 ENCOUNTER — Other Ambulatory Visit: Payer: Self-pay | Admitting: Neurological Surgery

## 2017-02-06 ENCOUNTER — Encounter (HOSPITAL_COMMUNITY): Payer: Self-pay

## 2017-02-06 ENCOUNTER — Encounter (HOSPITAL_COMMUNITY)
Admission: RE | Admit: 2017-02-06 | Discharge: 2017-02-06 | Disposition: A | Discharge status: Home / Self Care | Payer: BLUE CROSS/BLUE SHIELD | Source: Ambulatory Visit | Attending: Neurological Surgery | Admitting: Neurological Surgery

## 2017-02-06 DIAGNOSIS — I10 Essential (primary) hypertension: Secondary | ICD-10-CM

## 2017-02-06 DIAGNOSIS — E785 Hyperlipidemia, unspecified: Secondary | ICD-10-CM | POA: Insufficient documentation

## 2017-02-06 DIAGNOSIS — I493 Ventricular premature depolarization: Secondary | ICD-10-CM

## 2017-02-06 DIAGNOSIS — Z01812 Encounter for preprocedural laboratory examination: Secondary | ICD-10-CM | POA: Insufficient documentation

## 2017-02-06 DIAGNOSIS — Z90710 Acquired absence of both cervix and uterus: Secondary | ICD-10-CM

## 2017-02-06 DIAGNOSIS — D649 Anemia, unspecified: Secondary | ICD-10-CM

## 2017-02-06 DIAGNOSIS — Z79899 Other long term (current) drug therapy: Secondary | ICD-10-CM | POA: Insufficient documentation

## 2017-02-06 DIAGNOSIS — Z9889 Other specified postprocedural states: Secondary | ICD-10-CM | POA: Insufficient documentation

## 2017-02-06 LAB — CBC
HCT: 34.3 % — ABNORMAL LOW (ref 36.0–46.0)
Hemoglobin: 11.2 g/dL — ABNORMAL LOW (ref 12.0–15.0)
MCH: 34 pg (ref 26.0–34.0)
MCHC: 32.7 g/dL (ref 30.0–36.0)
MCV: 104.3 fL — ABNORMAL HIGH (ref 78.0–100.0)
Platelets: 312 10*3/uL (ref 150–400)
RBC: 3.29 MIL/uL — ABNORMAL LOW (ref 3.87–5.11)
RDW: 13.4 % (ref 11.5–15.5)
WBC: 7.9 10*3/uL (ref 4.0–10.5)

## 2017-02-06 LAB — BASIC METABOLIC PANEL
Anion gap: 12 (ref 5–15)
BUN: 16 mg/dL (ref 6–20)
CO2: 29 mmol/L (ref 22–32)
Calcium: 9.8 mg/dL (ref 8.9–10.3)
Chloride: 98 mmol/L — ABNORMAL LOW (ref 101–111)
Creatinine, Ser: 0.86 mg/dL (ref 0.44–1.00)
GFR calc Af Amer: >60 mL/min (ref >60)
GFR calc non Af Amer: >60 mL/min (ref >60)
Glucose, Bld: 93 mg/dL (ref 65–99)
Potassium: 3.5 mmol/L (ref 3.5–5.1)
Sodium: 139 mmol/L (ref 135–145)

## 2017-02-06 LAB — TYPE AND SCREEN
ABO/RH(D): O POS
Antibody Screen: NEGATIVE

## 2017-02-06 LAB — SURGICAL PCR SCREEN
MRSA, PCR: NEGATIVE
Staphylococcus aureus: NEGATIVE

## 2017-02-06 NOTE — Pre-Procedure Instructions (Signed)
Janalee Proffer  02/06/2017      [facility] [place], [place] [id number] [place] [id number] Phone: [phone] Fax: [phone]  [facility] [place] [place] - [address] [place] [address] Phone: [phone] Fax: [phone]  [facility] [place], [place] - [place] [address] Phone: [phone] Fax: [phone]    Your procedure is scheduled on   Monday  02/11/17  Report to [facility] at 1030 A.M.  Call this number if you have problems the morning of surgery:  [phone]   Remember:  Do not eat food or drink liquids after midnight.  Take these medicines the morning of surgery with A SIP OF WATER   ALBUTEROL, ALPRAZOLAM IF NEEDED, ESTRADIOL, METOPROLOL (TOPROL), OXYCODONE IF NEEDED     (STOP 7 DAYS PRIOR TO SURGERY- ASPIRIN OR ASPIRIN PRODUCTS, NAPROXEN/ ANAPROX, IBUPROFEN/ ADVIL/ MOTRIN, GOODY POWDERS, BC'S, HERBAL MEDICINES)   Do not wear jewelry, make-up or nail polish.  Do not wear lotions, powders, or perfumes, or deoderant.  Do not shave 48 hours prior to surgery.  Men may shave face and neck.  Do not bring valuables to the hospital.  [facility] is not responsible for any belongings or valuables.  Contacts, dentures or bridgework may not be worn into surgery.  Leave your suitcase in the car.  After surgery it may be brought to your room.  For patients admitted to the hospital, discharge time will be determined by your treatment team.  Patients discharged the day of surgery will not be allowed to drive home.   Name and phone number of your driver:    Special instructions:  [facility] - Preparing for Surgery  Before surgery, you can play an important role.  Because skin is not sterile, your skin needs to be as free of germs as possible.  You can reduce the number of germs on you skin by washing with CHG  (chlorahexidine gluconate) soap before surgery.  CHG is an antiseptic cleaner which kills germs and bonds with the skin to continue killing germs even after washing.  Please DO NOT use if you have an allergy to CHG or antibacterial soaps.  If your skin becomes reddened/irritated stop using the CHG and inform your nurse when you arrive at Short Stay.  Do not shave (including legs and underarms) for at least 48 hours prior to the first CHG shower.  You may shave your face.  Please follow these instructions carefully:   1.  Shower with CHG Soap the night before surgery and the                                morning of Surgery.  2.  If you choose to wash your hair, wash your hair first as usual with your       normal shampoo.  3.  After you shampoo, rinse your hair and body thoroughly to remove the                      Shampoo.  4.  Use CHG as you would any other liquid soap.  You can apply chg directly       to the skin and wash gently with scrungie or a clean washcloth.  5.  Apply the CHG Soap to your  body ONLY FROM THE NECK DOWN.        Do not use on open wounds or open sores.  Avoid contact with your eyes,       ears, mouth and genitals (private parts).  Wash genitals (private parts)       with your normal soap.  6.  Wash thoroughly, paying special attention to the area where your surgery        will be performed.  7.  Thoroughly rinse your body with warm water from the neck down.  8.  DO NOT shower/wash with your normal soap after using and rinsing off       the CHG Soap.  9.  Pat yourself dry with a clean towel.            10.  Wear clean pajamas.            11.  Place clean sheets on your bed the night of your first shower and do not        sleep with pets.  Day of Surgery  Do not apply any lotions/deoderants the morning of surgery.  Please wear clean clothes to the hospital/surgery center.    Please read over the following fact sheets that you were given. Pain Booklet

## 2017-02-07 NOTE — Progress Notes (Signed)
Anesthesia chart review: Patient is a 56 year old female scheduled for L3-4 posterior lumbar interbody fusion on 02/11/2017 by Dr. Ellene Route.  History includes smoking, hypertension, hyperlipidemia, PVCs (diagnosed '90), anemia, endometriosis, hysterectomy '01, breast augmentation, L5-S1 lumbar fusion '10.  PCP is listed as Dr. Derinda Late, although she has several visits at [facility]. Cardiologist is Dr. Kirk Ruths, last visit 11/08/16.  Meds include albuterol, Xanax, Estrace, losartan, Toprol XL, Percocet, Crestor, Maxzide-25, Ambien.  BP (!) 144/82   Pulse (!) 107   Temp 36.8 C   Resp 20   Ht 5\' 5"  (1.651 m)   Wt 137 lb (62.1 kg)   SpO2 98%   BMI 22.80 kg/m   EKG 11/08/16: NSR, cannot rule out anterior infarct (age undetermined).  Exercise tolerance stress test 12/11/16:  Blood pressure demonstrated a hypertensive response to exercise.  No T wave inversion was noted during stress.  Upsloping ST segment depression ST segment depression of 1 mm was noted during stress.  Overall, the patient's exercise capacity was mildly impaired.  [facility] Treadmill Score: low risk Negative stress test without evidence of ischemia at given workload (85% MHR).  Echo 12/11/16: Study Conclusions - Left ventricle: The cavity size was normal. Systolic function was   normal. The estimated ejection fraction was in the range of 60%   to 65%. Wall motion was normal; there were no regional wall   motion abnormalities. The study is not technically sufficient to   allow evaluation of LV diastolic function. - Aortic valve: Transvalvular velocity was within the normal range.   There was no stenosis. There was no regurgitation. - Mitral valve: Transvalvular velocity was within the normal range.   There was no evidence for stenosis. There was no regurgitation. - Right ventricle: The cavity size was normal. Wall thickness was   normal. Systolic function was normal. - Atrial septum:  No defect or patent foramen ovale was identified   by color flow Doppler. - Tricuspid valve: There was trivial regurgitation. - Pulmonary arteries: Systolic pressure was within the normal   range. PA peak pressure: 24 mm Hg (S).  Carotid U/S 09/02/14: Impression: Doppler velocities suggest no stenosis of the bilateral internal carotid arteries.  CXR 03/17/16: IMPRESSION: Negative, no acute cardiopulmonary abnormality.  Preoperative labs noted.  She had recent cardiology testing. ETT negative at 85% workload. No additional testing ordered by cardiology. If no acute changes then I anticipate that she can proceed as planned. Discussed with anesthesiologist Dr. Gifford Shave.  George [facility] Short Stay Center/Anesthesiology Phone [phone] 02/07/2017 5:15 PM

## 2017-02-09 ENCOUNTER — Inpatient Hospital Stay (HOSPITAL_COMMUNITY): Payer: BLUE CROSS/BLUE SHIELD

## 2017-02-09 ENCOUNTER — Inpatient Hospital Stay (HOSPITAL_COMMUNITY)
Admission: RE | Admit: 2017-02-09 | Discharge: 2017-02-12 | DRG: 455 | Disposition: A | Discharge status: Home / Self Care | Payer: BLUE CROSS/BLUE SHIELD | Source: Ambulatory Visit | Attending: Neurological Surgery | Admitting: Neurological Surgery

## 2017-02-09 ENCOUNTER — Inpatient Hospital Stay (HOSPITAL_COMMUNITY): Payer: BLUE CROSS/BLUE SHIELD | Admitting: Vascular Surgery

## 2017-02-09 ENCOUNTER — Encounter (HOSPITAL_COMMUNITY): Payer: Self-pay | Admitting: *Deleted

## 2017-02-09 ENCOUNTER — Encounter (HOSPITAL_COMMUNITY)
Admission: RE | Disposition: A | Discharge status: Home / Self Care | Payer: Self-pay | Source: Ambulatory Visit | Attending: Neurological Surgery

## 2017-02-09 ENCOUNTER — Inpatient Hospital Stay (HOSPITAL_COMMUNITY): Payer: BLUE CROSS/BLUE SHIELD | Admitting: Anesthesiology

## 2017-02-09 DIAGNOSIS — M4316 Spondylolisthesis, lumbar region: Secondary | ICD-10-CM | POA: Diagnosis present

## 2017-02-09 DIAGNOSIS — Z881 Allergy status to other antibiotic agents status: Secondary | ICD-10-CM | POA: Diagnosis not present

## 2017-02-09 DIAGNOSIS — R0789 Other chest pain: Secondary | ICD-10-CM

## 2017-02-09 DIAGNOSIS — Z9882 Breast implant status: Secondary | ICD-10-CM | POA: Diagnosis not present

## 2017-02-09 DIAGNOSIS — Z419 Encounter for procedure for purposes other than remedying health state, unspecified: Secondary | ICD-10-CM

## 2017-02-09 DIAGNOSIS — I10 Essential (primary) hypertension: Secondary | ICD-10-CM | POA: Diagnosis present

## 2017-02-09 DIAGNOSIS — M5416 Radiculopathy, lumbar region: Secondary | ICD-10-CM | POA: Diagnosis present

## 2017-02-09 DIAGNOSIS — Z79899 Other long term (current) drug therapy: Secondary | ICD-10-CM

## 2017-02-09 DIAGNOSIS — M48062 Spinal stenosis, lumbar region with neurogenic claudication: Principal | ICD-10-CM | POA: Diagnosis present

## 2017-02-09 DIAGNOSIS — F17210 Nicotine dependence, cigarettes, uncomplicated: Secondary | ICD-10-CM | POA: Diagnosis present

## 2017-02-09 DIAGNOSIS — G8929 Other chronic pain: Secondary | ICD-10-CM | POA: Diagnosis present

## 2017-02-09 DIAGNOSIS — E785 Hyperlipidemia, unspecified: Secondary | ICD-10-CM | POA: Diagnosis present

## 2017-02-09 DIAGNOSIS — R0602 Shortness of breath: Secondary | ICD-10-CM | POA: Diagnosis not present

## 2017-02-09 DIAGNOSIS — R110 Nausea: Secondary | ICD-10-CM | POA: Diagnosis not present

## 2017-02-09 DIAGNOSIS — Z90710 Acquired absence of both cervix and uterus: Secondary | ICD-10-CM | POA: Diagnosis not present

## 2017-02-09 DIAGNOSIS — T368X5A Adverse effect of other systemic antibiotics, initial encounter: Secondary | ICD-10-CM | POA: Diagnosis not present

## 2017-02-09 DIAGNOSIS — Z981 Arthrodesis status: Secondary | ICD-10-CM

## 2017-02-09 DIAGNOSIS — M48061 Spinal stenosis, lumbar region without neurogenic claudication: Secondary | ICD-10-CM | POA: Diagnosis present

## 2017-02-09 DIAGNOSIS — Y92239 Unspecified place in hospital as the place of occurrence of the external cause: Secondary | ICD-10-CM | POA: Diagnosis not present

## 2017-02-09 SURGERY — POSTERIOR LUMBAR FUSION 1 LEVEL
Anesthesia: General | Site: Spine Lumbar

## 2017-02-09 MED ORDER — THROMBIN 20000 UNITS EX SOLR
CUTANEOUS | Status: AC
  Filled 2017-02-09: qty 20000

## 2017-02-09 MED ORDER — ONDANSETRON HCL 4 MG/2ML IJ SOLN
INTRAMUSCULAR | Status: DC | PRN
  Administered 2017-02-09: 4 mg via INTRAVENOUS

## 2017-02-09 MED ORDER — OXYCODONE HCL 5 MG PO TABS
5.0000 mg | ORAL_TABLET | ORAL | Status: DC | PRN
  Administered 2017-02-09 – 2017-02-12 (×14): 10 mg via ORAL
  Filled 2017-02-09 (×15): qty 2

## 2017-02-09 MED ORDER — VANCOMYCIN HCL IN DEXTROSE 1-5 GM/200ML-% IV SOLN
1000.0000 mg | Freq: Three times a day (TID) | INTRAVENOUS | Status: AC
  Administered 2017-02-09 – 2017-02-10 (×2): 1000 mg via INTRAVENOUS
  Filled 2017-02-09 (×2): qty 200

## 2017-02-09 MED ORDER — ONDANSETRON HCL 4 MG/2ML IJ SOLN
4.0000 mg | INTRAMUSCULAR | Status: DC | PRN
  Administered 2017-02-10: 4 mg via INTRAVENOUS
  Filled 2017-02-09: qty 2

## 2017-02-09 MED ORDER — DOCUSATE SODIUM 100 MG PO CAPS: 100.0000 mg | ORAL_CAPSULE | Freq: Two times a day (BID) | ORAL | Status: DC

## 2017-02-09 MED ORDER — ROSUVASTATIN CALCIUM 5 MG PO TABS
10.0000 mg | ORAL_TABLET | Freq: Every day | ORAL | Status: DC
  Administered 2017-02-09 – 2017-02-12 (×4): 10 mg via ORAL
  Filled 2017-02-09 (×4): qty 2

## 2017-02-09 MED ORDER — SODIUM CHLORIDE 0.9% FLUSH
3.0000 mL | Freq: Two times a day (BID) | INTRAVENOUS | Status: DC
  Administered 2017-02-09 – 2017-02-12 (×5): 3 mL via INTRAVENOUS

## 2017-02-09 MED ORDER — ALBUTEROL SULFATE HFA 108 (90 BASE) MCG/ACT IN AERS: 2.0000 | INHALATION_SPRAY | RESPIRATORY_TRACT | Status: DC | PRN

## 2017-02-09 MED ORDER — BISACODYL 10 MG RE SUPP: 10.0000 mg | Freq: Every day | RECTAL | Status: DC | PRN

## 2017-02-09 MED ORDER — ALPRAZOLAM 0.5 MG PO TABS
0.5000 mg | ORAL_TABLET | Freq: Two times a day (BID) | ORAL | Status: DC | PRN
  Administered 2017-02-10: 0.5 mg via ORAL
  Filled 2017-02-09 (×2): qty 1

## 2017-02-09 MED ORDER — FLEET ENEMA 7-19 GM/118ML RE ENEM: 1.0000 | ENEMA | Freq: Once | RECTAL | Status: DC | PRN

## 2017-02-09 MED ORDER — HYDROMORPHONE HCL 1 MG/ML IJ SOLN
0.2500 mg | INTRAMUSCULAR | Status: DC | PRN
  Administered 2017-02-09 (×4): 0.5 mg via INTRAVENOUS

## 2017-02-09 MED ORDER — ACETAMINOPHEN 325 MG PO TABS: 650.0000 mg | ORAL_TABLET | Freq: Four times a day (QID) | ORAL | Status: DC | PRN

## 2017-02-09 MED ORDER — HYDROMORPHONE HCL 1 MG/ML IJ SOLN
INTRAMUSCULAR | Status: AC
  Administered 2017-02-09: 0.5 mg via INTRAVENOUS
  Filled 2017-02-09: qty 0.5

## 2017-02-09 MED ORDER — METHOCARBAMOL 1000 MG/10ML IJ SOLN
500.0000 mg | Freq: Four times a day (QID) | INTRAVENOUS | Status: DC | PRN
  Filled 2017-02-09: qty 5

## 2017-02-09 MED ORDER — PHENYLEPHRINE HCL 10 MG/ML IJ SOLN
INTRAMUSCULAR | Status: DC | PRN
  Administered 2017-02-09 (×3): 80 ug via INTRAVENOUS
  Administered 2017-02-09: 160 ug via INTRAVENOUS

## 2017-02-09 MED ORDER — ONDANSETRON HCL 4 MG/2ML IJ SOLN: 4.0000 mg | INTRAMUSCULAR | Status: DC | PRN

## 2017-02-09 MED ORDER — ROCURONIUM 10MG/ML (10ML) SYRINGE FOR MEDFUSION PUMP - OPTIME
INTRAVENOUS | Status: DC | PRN
  Administered 2017-02-09: 50 mg via INTRAVENOUS

## 2017-02-09 MED ORDER — ACETAMINOPHEN 650 MG RE SUPP: 650.0000 mg | RECTAL | Status: DC | PRN

## 2017-02-09 MED ORDER — CEFAZOLIN SODIUM-DEXTROSE 2-4 GM/100ML-% IV SOLN: 2.0000 g | Freq: Three times a day (TID) | INTRAVENOUS | Status: DC

## 2017-02-09 MED ORDER — SUFENTANIL CITRATE 50 MCG/ML IV SOLN
INTRAVENOUS | Status: DC | PRN
  Administered 2017-02-09: 10 ug via INTRAVENOUS
  Administered 2017-02-09: 5 ug via INTRAVENOUS
  Administered 2017-02-09 (×3): 10 ug via INTRAVENOUS
  Administered 2017-02-09: 5 ug via INTRAVENOUS

## 2017-02-09 MED ORDER — HYDROMORPHONE HCL 1 MG/ML IJ SOLN
1.0000 mg | INTRAMUSCULAR | Status: DC | PRN
  Administered 2017-02-09 – 2017-02-12 (×11): 1 mg via INTRAVENOUS
  Filled 2017-02-09 (×11): qty 1

## 2017-02-09 MED ORDER — POLYETHYLENE GLYCOL 3350 17 G PO PACK: 17.0000 g | PACK | Freq: Every day | ORAL | Status: DC | PRN

## 2017-02-09 MED ORDER — PHENOL 1.4 % MT LIQD: 1.0000 | OROMUCOSAL | Status: DC | PRN

## 2017-02-09 MED ORDER — LOSARTAN POTASSIUM 50 MG PO TABS
100.0000 mg | ORAL_TABLET | Freq: Every day | ORAL | Status: DC
  Administered 2017-02-09 – 2017-02-10 (×2): 100 mg via ORAL
  Filled 2017-02-09 (×3): qty 2

## 2017-02-09 MED ORDER — SENNA 8.6 MG PO TABS: 1.0000 | ORAL_TABLET | Freq: Two times a day (BID) | ORAL | Status: DC

## 2017-02-09 MED ORDER — SODIUM CHLORIDE 0.9 % IV SOLN: 250.0000 mL | INTRAVENOUS | Status: DC

## 2017-02-09 MED ORDER — SODIUM CHLORIDE 0.9 % IV SOLN: INTRAVENOUS | Status: DC

## 2017-02-09 MED ORDER — PROPOFOL 10 MG/ML IV BOLUS
INTRAVENOUS | Status: AC
  Filled 2017-02-09: qty 20

## 2017-02-09 MED ORDER — BUPIVACAINE HCL (PF) 0.5 % IJ SOLN
INTRAMUSCULAR | Status: AC
  Filled 2017-02-09: qty 30

## 2017-02-09 MED ORDER — EPHEDRINE 5 MG/ML INJ
INTRAVENOUS | Status: AC
  Filled 2017-02-09: qty 10

## 2017-02-09 MED ORDER — LIDOCAINE-EPINEPHRINE (PF) 2 %-1:200000 IJ SOLN
INTRAMUSCULAR | Status: DC | PRN
Start: 2017-02-09 — End: 2017-02-09
  Administered 2017-02-09: 5 mL

## 2017-02-09 MED ORDER — VANCOMYCIN HCL IN DEXTROSE 1-5 GM/200ML-% IV SOLN
1000.0000 mg | INTRAVENOUS | Status: AC
  Administered 2017-02-09: 1000 mg via INTRAVENOUS
  Filled 2017-02-09: qty 200

## 2017-02-09 MED ORDER — PHENYLEPHRINE 40 MCG/ML (10ML) SYRINGE FOR IV PUSH (FOR BLOOD PRESSURE SUPPORT)
PREFILLED_SYRINGE | INTRAVENOUS | Status: AC
  Filled 2017-02-09: qty 10

## 2017-02-09 MED ORDER — SODIUM CHLORIDE 0.9% FLUSH: 3.0000 mL | Freq: Two times a day (BID) | INTRAVENOUS | Status: DC

## 2017-02-09 MED ORDER — ACETAMINOPHEN 325 MG PO TABS: 650.0000 mg | ORAL_TABLET | ORAL | Status: DC | PRN

## 2017-02-09 MED ORDER — CHLORHEXIDINE GLUCONATE CLOTH 2 % EX PADS: 6.0000 | MEDICATED_PAD | Freq: Once | CUTANEOUS | Status: DC

## 2017-02-09 MED ORDER — LACTATED RINGERS IV SOLN
INTRAVENOUS | Status: DC | PRN
  Administered 2017-02-09 (×3): via INTRAVENOUS

## 2017-02-09 MED ORDER — OXYCODONE-ACETAMINOPHEN 5-325 MG PO TABS: 1.0000 | ORAL_TABLET | Freq: Four times a day (QID) | ORAL | Status: DC | PRN

## 2017-02-09 MED ORDER — HYDROMORPHONE HCL 1 MG/ML IJ SOLN
INTRAMUSCULAR | Status: AC
  Filled 2017-02-09: qty 0.5

## 2017-02-09 MED ORDER — ROCURONIUM BROMIDE 50 MG/5ML IV SOSY
PREFILLED_SYRINGE | INTRAVENOUS | Status: AC
  Filled 2017-02-09: qty 5

## 2017-02-09 MED ORDER — PROMETHAZINE HCL 25 MG/ML IJ SOLN: 6.2500 mg | INTRAMUSCULAR | Status: DC | PRN

## 2017-02-09 MED ORDER — ACETAMINOPHEN 650 MG RE SUPP: 650.0000 mg | Freq: Four times a day (QID) | RECTAL | Status: DC | PRN

## 2017-02-09 MED ORDER — SODIUM CHLORIDE 0.9% FLUSH: 3.0000 mL | INTRAVENOUS | Status: DC | PRN

## 2017-02-09 MED ORDER — MIDAZOLAM HCL 2 MG/2ML IJ SOLN
INTRAMUSCULAR | Status: AC
  Filled 2017-02-09: qty 2

## 2017-02-09 MED ORDER — MENTHOL 3 MG MT LOZG: 1.0000 | LOZENGE | OROMUCOSAL | Status: DC | PRN

## 2017-02-09 MED ORDER — SODIUM CHLORIDE 0.9 % IJ SOLN
INTRAMUSCULAR | Status: AC
  Filled 2017-02-09: qty 10

## 2017-02-09 MED ORDER — MIDAZOLAM HCL 5 MG/5ML IJ SOLN
INTRAMUSCULAR | Status: DC | PRN
  Administered 2017-02-09: 2 mg via INTRAVENOUS

## 2017-02-09 MED ORDER — 0.9 % SODIUM CHLORIDE (POUR BTL) OPTIME
TOPICAL | Status: DC | PRN
Start: 2017-02-09 — End: 2017-02-09
  Administered 2017-02-09: 1000 mL

## 2017-02-09 MED ORDER — PROPOFOL 10 MG/ML IV BOLUS
INTRAVENOUS | Status: DC | PRN
  Administered 2017-02-09: 200 mg via INTRAVENOUS

## 2017-02-09 MED ORDER — ZOLPIDEM TARTRATE 5 MG PO TABS
5.0000 mg | ORAL_TABLET | Freq: Every evening | ORAL | Status: DC | PRN
  Filled 2017-02-09: qty 1

## 2017-02-09 MED ORDER — LIDOCAINE HCL (CARDIAC) 20 MG/ML IV SOLN
INTRAVENOUS | Status: DC | PRN
  Administered 2017-02-09: 80 mg via INTRAVENOUS

## 2017-02-09 MED ORDER — METOPROLOL SUCCINATE ER 25 MG PO TB24
25.0000 mg | ORAL_TABLET | Freq: Every day | ORAL | Status: DC
  Administered 2017-02-10 – 2017-02-12 (×2): 25 mg via ORAL
  Filled 2017-02-09 (×2): qty 1

## 2017-02-09 MED ORDER — ALBUTEROL SULFATE (2.5 MG/3ML) 0.083% IN NEBU
2.5000 mg | INHALATION_SOLUTION | RESPIRATORY_TRACT | Status: DC | PRN
  Filled 2017-02-09: qty 3

## 2017-02-09 MED ORDER — ESTRADIOL 1 MG PO TABS
1.0000 mg | ORAL_TABLET | Freq: Every day | ORAL | Status: DC
  Administered 2017-02-10 – 2017-02-12 (×3): 1 mg via ORAL
  Filled 2017-02-09 (×4): qty 1

## 2017-02-09 MED ORDER — EPHEDRINE SULFATE 50 MG/ML IJ SOLN
INTRAMUSCULAR | Status: DC | PRN
  Administered 2017-02-09 (×2): 10 mg via INTRAVENOUS

## 2017-02-09 MED ORDER — THROMBIN 20000 UNITS EX SOLR
CUTANEOUS | Status: DC | PRN
  Administered 2017-02-09: 09:00:00 via TOPICAL

## 2017-02-09 MED ORDER — SUGAMMADEX SODIUM 200 MG/2ML IV SOLN
INTRAVENOUS | Status: AC
  Filled 2017-02-09: qty 2

## 2017-02-09 MED ORDER — LIDOCAINE-EPINEPHRINE (PF) 2 %-1:200000 IJ SOLN
INTRAMUSCULAR | Status: AC
  Filled 2017-02-09: qty 20

## 2017-02-09 MED ORDER — THROMBIN 5000 UNITS EX SOLR
CUTANEOUS | Status: DC | PRN
  Administered 2017-02-09: 09:00:00 via TOPICAL

## 2017-02-09 MED ORDER — DOCUSATE SODIUM 100 MG PO CAPS
100.0000 mg | ORAL_CAPSULE | Freq: Two times a day (BID) | ORAL | Status: DC
  Administered 2017-02-09 – 2017-02-12 (×7): 100 mg via ORAL
  Filled 2017-02-09 (×7): qty 1

## 2017-02-09 MED ORDER — ONDANSETRON HCL 4 MG/2ML IJ SOLN
INTRAMUSCULAR | Status: AC
  Filled 2017-02-09: qty 2

## 2017-02-09 MED ORDER — SODIUM CHLORIDE 0.9 % IR SOLN
Status: DC | PRN
  Administered 2017-02-09: 09:00:00

## 2017-02-09 MED ORDER — SUFENTANIL CITRATE 50 MCG/ML IV SOLN
INTRAVENOUS | Status: AC
  Filled 2017-02-09: qty 1

## 2017-02-09 MED ORDER — METHOCARBAMOL 500 MG PO TABS
500.0000 mg | ORAL_TABLET | Freq: Four times a day (QID) | ORAL | Status: DC | PRN
  Administered 2017-02-09 – 2017-02-12 (×8): 500 mg via ORAL
  Filled 2017-02-09 (×8): qty 1

## 2017-02-09 MED ORDER — DEXAMETHASONE SODIUM PHOSPHATE 10 MG/ML IJ SOLN
INTRAMUSCULAR | Status: AC
  Filled 2017-02-09: qty 1

## 2017-02-09 MED ORDER — TRIAMTERENE-HCTZ 37.5-25 MG PO TABS
1.0000 | ORAL_TABLET | Freq: Every day | ORAL | Status: DC
  Administered 2017-02-09 – 2017-02-10 (×2): 1 via ORAL
  Filled 2017-02-09 (×3): qty 1

## 2017-02-09 MED ORDER — ALUM & MAG HYDROXIDE-SIMETH 200-200-20 MG/5ML PO SUSP
30.0000 mL | Freq: Four times a day (QID) | ORAL | Status: DC | PRN
  Administered 2017-02-10: 30 mL via ORAL
  Filled 2017-02-09: qty 30

## 2017-02-09 MED ORDER — THROMBIN 5000 UNITS EX SOLR
CUTANEOUS | Status: AC
  Filled 2017-02-09: qty 5000

## 2017-02-09 MED ORDER — SENNA 8.6 MG PO TABS
1.0000 | ORAL_TABLET | Freq: Two times a day (BID) | ORAL | Status: DC
  Administered 2017-02-09 – 2017-02-12 (×7): 8.6 mg via ORAL
  Filled 2017-02-09 (×7): qty 1

## 2017-02-09 MED ORDER — POLYETHYLENE GLYCOL 3350 17 G PO PACK
17.0000 g | PACK | Freq: Every day | ORAL | Status: DC | PRN
  Administered 2017-02-11: 17 g via ORAL
  Filled 2017-02-09 (×2): qty 1

## 2017-02-09 MED ORDER — DEXAMETHASONE SODIUM PHOSPHATE 10 MG/ML IJ SOLN
INTRAMUSCULAR | Status: DC | PRN
  Administered 2017-02-09: 10 mg via INTRAVENOUS

## 2017-02-09 MED ORDER — SUGAMMADEX SODIUM 200 MG/2ML IV SOLN
INTRAVENOUS | Status: DC | PRN
  Administered 2017-02-09: 200 mg via INTRAVENOUS

## 2017-02-09 MED ORDER — BUPIVACAINE HCL (PF) 0.5 % IJ SOLN
INTRAMUSCULAR | Status: DC | PRN
  Administered 2017-02-09: 5 mL
  Administered 2017-02-09: 25 mL

## 2017-02-09 MED ORDER — LIDOCAINE 2% (20 MG/ML) 5 ML SYRINGE
INTRAMUSCULAR | Status: AC
  Filled 2017-02-09: qty 5

## 2017-02-09 SURGICAL SUPPLY — 68 items
ADH SKN CLS APL DERMABOND .7 (GAUZE/BANDAGES/DRESSINGS) ×1
APL SRG 60D 8 XTD TIP BNDBL (TIP)
BAG DECANTER FOR FLEXI CONT (MISCELLANEOUS) ×2 IMPLANT
BASKET BONE COLLECTION (BASKET) ×2 IMPLANT
BUR MATCHSTICK NEURO 3.0 LAGG (BURR) ×2 IMPLANT
CAGE COROENT MP 8X23 (Cage) ×4 IMPLANT
CANISTER SUCT 3000ML PPV (MISCELLANEOUS) ×2 IMPLANT
CARTRIDGE OIL MAESTRO DRILL (MISCELLANEOUS) ×1 IMPLANT
CONNECTOR RELINE 40-50MM 5.5 (Connector) ×1 IMPLANT
CONT SPEC 4OZ CLIKSEAL STRL BL (MISCELLANEOUS) ×2 IMPLANT
COVER BACK TABLE 60X90IN (DRAPES) ×2 IMPLANT
DERMABOND ADVANCED (GAUZE/BANDAGES/DRESSINGS) ×1
DERMABOND ADVANCED .7 DNX12 (GAUZE/BANDAGES/DRESSINGS) ×1 IMPLANT
DEVICE DISSECT PLASMABLAD 3.0S (MISCELLANEOUS) ×1 IMPLANT
DIFFUSER DRILL AIR PNEUMATIC (MISCELLANEOUS) ×2 IMPLANT
DRAPE C-ARM 42X72 X-RAY (DRAPES) ×4 IMPLANT
DRAPE HALF SHEET 40X57 (DRAPES) IMPLANT
DRAPE LAPAROTOMY 100X72X124 (DRAPES) ×2 IMPLANT
DRAPE POUCH INSTRU U-SHP 10X18 (DRAPES) ×2 IMPLANT
DRSG OPSITE POSTOP 4X8 (GAUZE/BANDAGES/DRESSINGS) ×1 IMPLANT
DURAPREP 26ML APPLICATOR (WOUND CARE) ×2 IMPLANT
DURASEAL APPLICATOR TIP (TIP) IMPLANT
DURASEAL SPINE SEALANT 3ML (MISCELLANEOUS) IMPLANT
ELECT REM PT RETURN 9FT ADLT (ELECTROSURGICAL) ×2
ELECTRODE REM PT RTRN 9FT ADLT (ELECTROSURGICAL) ×1 IMPLANT
GAUZE SPONGE 4X4 12PLY STRL (GAUZE/BANDAGES/DRESSINGS) ×2 IMPLANT
GAUZE SPONGE 4X4 16PLY XRAY LF (GAUZE/BANDAGES/DRESSINGS) ×1 IMPLANT
GLOVE BIO SURGEON STRL SZ7.5 (GLOVE) ×1 IMPLANT
GLOVE BIO SURGEON STRL SZ8 (GLOVE) ×1 IMPLANT
GLOVE BIOGEL M 7.0 STRL (GLOVE) ×3 IMPLANT
GLOVE BIOGEL PI IND STRL 8.5 (GLOVE) ×2 IMPLANT
GLOVE BIOGEL PI INDICATOR 8.5 (GLOVE) ×2
GLOVE ECLIPSE 8.5 STRL (GLOVE) ×4 IMPLANT
GLOVE INDICATOR 7.5 STRL GRN (GLOVE) ×1 IMPLANT
GLOVE INDICATOR 8.5 STRL (GLOVE) ×1 IMPLANT
GOWN STRL REUS W/ TWL LRG LVL3 (GOWN DISPOSABLE) IMPLANT
GOWN STRL REUS W/ TWL XL LVL3 (GOWN DISPOSABLE) IMPLANT
GOWN STRL REUS W/TWL 2XL LVL3 (GOWN DISPOSABLE) ×4 IMPLANT
GOWN STRL REUS W/TWL LRG LVL3 (GOWN DISPOSABLE)
GOWN STRL REUS W/TWL XL LVL3 (GOWN DISPOSABLE)
HEMOSTAT POWDER KIT SURGIFOAM (HEMOSTASIS) ×1 IMPLANT
KIT BASIN OR (CUSTOM PROCEDURE TRAY) ×2 IMPLANT
KIT ROOM TURNOVER OR (KITS) ×2 IMPLANT
MODULE POWER NUVASIVE (MISCELLANEOUS) ×1 IMPLANT
NEEDLE HYPO 22GX1.5 SAFETY (NEEDLE) ×2 IMPLANT
NS IRRIG 1000ML POUR BTL (IV SOLUTION) ×2 IMPLANT
OIL CARTRIDGE MAESTRO DRILL (MISCELLANEOUS) ×2
PACK LAMINECTOMY NEURO (CUSTOM PROCEDURE TRAY) ×2 IMPLANT
PAD ARMBOARD 7.5X6 YLW CONV (MISCELLANEOUS) ×6 IMPLANT
PATTIES SURGICAL .5 X1 (DISPOSABLE) ×1 IMPLANT
PLASMABLADE 3.0S (MISCELLANEOUS) ×2
POWER MODULE NUVASIVE (MISCELLANEOUS) ×2
PUTTY BONE DBX 2.5 MIS (Bone Implant) ×2 IMPLANT
ROD RELIN-O LORD 5.5X65MM (Rod) ×2 IMPLANT
SCREW LOCK RELINE 5.5 TULIP (Screw) ×4 IMPLANT
SCREW RELINE-O POLY 6.5X40 (Screw) ×4 IMPLANT
SPONGE LAP 4X18 X RAY DECT (DISPOSABLE) IMPLANT
SPONGE SURGIFOAM ABS GEL 100 (HEMOSTASIS) ×2 IMPLANT
SUT PROLENE 6 0 BV (SUTURE) IMPLANT
SUT VIC AB 1 CT1 18XBRD ANBCTR (SUTURE) ×1 IMPLANT
SUT VIC AB 1 CT1 8-18 (SUTURE) ×2
SUT VIC AB 2-0 CP2 18 (SUTURE) ×2 IMPLANT
SUT VIC AB 3-0 SH 8-18 (SUTURE) ×3 IMPLANT
SYR 3ML LL SCALE MARK (SYRINGE) ×12 IMPLANT
TOWEL OR 17X24 6PK STRL BLUE (TOWEL DISPOSABLE) ×2 IMPLANT
TOWEL OR 17X26 10 PK STRL BLUE (TOWEL DISPOSABLE) ×2 IMPLANT
TRAY FOLEY W/METER SILVER 16FR (SET/KITS/TRAYS/PACK) ×2 IMPLANT
WATER STERILE IRR 1000ML POUR (IV SOLUTION) ×2 IMPLANT

## 2017-02-09 NOTE — Progress Notes (Signed)
Pt arrived to 5C07 via stretcher.  Alert and oriented, complaints of pain 6/10, awaiting pharmacy verification.  VSS.  Will continue to monitor.    Cori Razor, RN

## 2017-02-09 NOTE — H&P (Signed)
[name] is an 56 y.o. female.   Chief Complaint: Back and bilateral leg pain HPI: [name] jerking is a 56 year old individual who's had previous significant difficulties with her lumbar spine. She initially had an disc degenerative changes at L5-S1 and this resulted first in a decompression followed by arthrodesis. She subsequently had difficulties with L4-L5 and in 2009 she underwent decompression and arthrodesis at that level. Off-and-on she's had back trouble and over the years it's been noted that she progressively has degenerated the L3-4 disc space. She had developed a retrolisthesis about 3 years ago and she did respond well to intradiscal injections for a period of time but now she notes that she has chronic back pain and is unremitting despite efforts at conservative management limitation of activity injections. After careful consideration we advised surgical decompression arthrodesis of L3 to the L4 to sacrum fusion she is now admitted for this procedure.  Past Medical History:  Diagnosis Date  . Adenomatous polyps   . Anemia   . Blood transfusion without reported diagnosis 01/1999   following hysterectomy  . Bulging discs   . Endometriosis   . Hyperlipidemia   . Hypertension   . PVC (premature ventricular contraction)   . Urinary incontinence     Past Surgical History:  Procedure Laterality Date  . ABDOMINAL HYSTERECTOMY  2001   TAH/RSO  . BLADDER SUSPENSION  2003   LSO and rectocele repair at [facility]  . BREAST SURGERY     breast augmentation--saline implants  . HERNIA REPAIR    . LUMBAR FUSION  2010   Dr. Carloyn Manner  . SPINE SURGERY      Family History  Problem Relation Age of Onset  . Hypertension Mother   . Hypertension Father   . Cancer Father   . Hyperlipidemia Sister   . Hypertension Sister   . Hyperlipidemia Brother   . Hypertension Brother   . Hypertension Brother   . Hypertension Brother   . Cancer Maternal Uncle 72    colon ca  . Breast cancer Maternal  Grandmother   . Cancer Maternal Grandmother 61    colon ca   Social History:  reports that she has been smoking Cigarettes.  She has never used smokeless tobacco. She reports that she drinks about 3.0 oz of alcohol per week . She reports that she does not use drugs.  Allergies:  Allergies  Allergen Reactions  . Ceftin [Cefuroxime] Nausea And Vomiting  . Penicillins Hives    Has patient had a PCN reaction causing immediate rash, facial/tongue/throat swelling, SOB or lightheadedness with hypotension: Yes Has patient had a PCN reaction causing severe rash involving mucus membranes or skin necrosis:Yes Has patient had a PCN reaction that required hospitalization:No Has patient had a PCN reaction occurring within the last 10 years: No If all of the above answers are "NO", then may proceed with Cephalosporin use.     Medications Prior to Admission  Medication Sig Dispense Refill  . ALPRAZolam (XANAX) 0.5 MG tablet Take 0.5 mg by mouth 2 (two) times daily as needed. For anxiety     . estradiol (ESTRACE) 1 MG tablet Take 1 tablet (1 mg total) by mouth every morning. (Patient taking differently: Take 1 mg by mouth daily. ) 90 tablet 3  . losartan (COZAAR) 100 MG tablet Take 100 mg by mouth daily.     . metoprolol succinate (TOPROL-XL) 25 MG 24 hr tablet Take 25 mg by mouth daily.     . naproxen sodium (ANAPROX)  220 MG tablet Take 220 mg by mouth 2 (two) times daily.    Marland Kitchen oxyCODONE-acetaminophen (PERCOCET/ROXICET) 5-325 MG tablet Take 1 tablet by mouth every 6 (six) hours as needed (for pain.).     Marland Kitchen rosuvastatin (CRESTOR) 10 MG tablet Take 10 mg by mouth daily.    Marland Kitchen triamterene-hydrochlorothiazide (MAXZIDE-25) 37.5-25 MG tablet Take 1 tablet by mouth daily.    Marland Kitchen zolpidem (AMBIEN) 10 MG tablet Take 10 mg by mouth at bedtime as needed for sleep.    Marland Kitchen albuterol (PROVENTIL HFA;VENTOLIN HFA) 108 (90 BASE) MCG/ACT inhaler Inhale 2 puffs into the lungs every 4 (four) hours as needed for wheezing or  shortness of breath (cough, shortness of breath or wheezing.). 1 Inhaler 1  . chlorpheniramine-HYDROcodone (TUSSIONEX PENNKINETIC ER) 10-8 MG/5ML SUER Take 5 mLs by mouth every 12 (twelve) hours as needed for cough. (Patient not taking: Reported on 02/05/2017) 100 mL 0  . doxycycline (VIBRAMYCIN) 100 MG capsule Take 1 capsule (100 mg total) by mouth 2 (two) times daily. (Patient not taking: Reported on 02/05/2017) 20 capsule 0  . erythromycin (ROMYCIN) ophthalmic ointment Place 1 application into the right eye 4 (four) times daily. (Patient not taking: Reported on 02/05/2017) 3.5 g 0    No results found for this or any previous visit (from the past 48 hour(s)). No results found.  Review of Systems  HENT: Negative.   Eyes: Negative.   Respiratory: Negative.   Cardiovascular: Negative.   Gastrointestinal: Negative.   Genitourinary: Negative.   Musculoskeletal: Positive for back pain.  Skin: Negative.   Neurological: Positive for sensory change, focal weakness and weakness.  Endo/Heme/Allergies: Negative.   Psychiatric/Behavioral: Negative.     Blood pressure (!) 143/101, pulse 87, temperature 98.3 F (36.8 C), temperature source Oral, resp. rate 18, SpO2 98 %. Physical Exam  Constitutional: She is oriented to person, place, and time. She appears well-developed and well-nourished.  HENT:  Head: Normocephalic and atraumatic.  Eyes: Conjunctivae and EOM are normal. Pupils are equal, round, and reactive to light.  Neck: Normal range of motion. Neck supple.  Cardiovascular: Regular rhythm and normal heart sounds.   Respiratory: Effort normal and breath sounds normal.  GI: Soft. Bowel sounds are normal.  Neurological: She is alert and oriented to person, place, and time.  Skin: Skin is warm and dry.  Psychiatric: She has a normal mood and affect. Her behavior is normal. Judgment and thought content normal.     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  Decompression and fusion of L3-L4 to sacrum fusion.  Earleen [place], MD 02/09/2017, 6:43 AM

## 2017-02-09 NOTE — Anesthesia Preprocedure Evaluation (Signed)
Anesthesia Evaluation  Patient identified by MRN, date of birth, ID band Patient awake    Reviewed: Allergy & Precautions, NPO status , Patient's Chart, lab work & pertinent test results, reviewed documented beta blocker date and time   Airway Mallampati: II  TM Distance: >3 FB Neck ROM: Full    Dental  (+) Dental Advisory Given   Pulmonary Current Smoker,    breath sounds clear to auscultation       Cardiovascular hypertension, Pt. on medications and Pt. on home beta blockers  Rhythm:Regular Rate:Normal     Neuro/Psych Depression negative neurological ROS     GI/Hepatic negative GI ROS, Neg liver ROS,   Endo/Other  negative endocrine ROS  Renal/GU negative Renal ROS     Musculoskeletal   Abdominal   Peds  Hematology  (+) anemia ,   Anesthesia Other Findings   Reproductive/Obstetrics                             Lab Results  Component Value Date   WBC 7.9 02/06/2017   HGB 11.2 (L) 02/06/2017   HCT 34.3 (L) 02/06/2017   MCV 104.3 (H) 02/06/2017   PLT 312 02/06/2017   Lab Results  Component Value Date   CREATININE 0.86 02/06/2017   BUN 16 02/06/2017   NA 139 02/06/2017   K 3.5 02/06/2017   CL 98 (L) 02/06/2017   CO2 29 02/06/2017    Anesthesia Physical Anesthesia Plan  ASA: II  Anesthesia Plan: General   Post-op Pain Management:    Induction: Intravenous  Airway Management Planned: Oral ETT  Additional Equipment:   Intra-op Plan:   Post-operative Plan: Extubation in OR  Informed Consent: I have reviewed the patients History and Physical, chart, labs and discussed the procedure including the risks, benefits and alternatives for the proposed anesthesia with the patient or authorized representative who has indicated his/her understanding and acceptance.   Dental advisory given  Plan Discussed with: CRNA  Anesthesia Plan Comments:         Anesthesia Quick  Evaluation

## 2017-02-09 NOTE — Transfer of Care (Signed)
Immediate Anesthesia Transfer of Care Note  Patient: [name]  Procedure(s) Performed: Procedure(s) with comments: Lumbar Three-Four  Posterior lumbar interbody fusion (N/A) - L3-4 Posterior lumbar interbody fusion  Patient Location: PACU  Anesthesia Type:General  Level of Consciousness: awake, oriented and patient cooperative  Airway & Oxygen Therapy: Patient Spontanous Breathing and Patient connected to nasal cannula oxygen  Post-op Assessment: Report given to RN, Post -op Vital signs reviewed and stable and Patient moving all extremities X 4  Post vital signs: Reviewed and stable  Last Vitals:  Vitals:   02/09/17 0631 02/09/17 1054  BP: (!) 143/101   Pulse: 87   Resp: 18 (P) 15  Temp: 36.8 C (P) 36.5 C    Last Pain:  Vitals:   02/09/17 0631  TempSrc: Oral  PainSc: 8          Complications: No apparent anesthesia complications

## 2017-02-09 NOTE — Op Note (Signed)
Date of surgery: 02/09/2017 Preoperative diagnosis: Lumbar stenosis L3-L4 status post arthrodesis L4 to sacrum, herniated nucleus pulposus on the right L3-L4, lumbar radiculopathy. Postoperative diagnosis: Same Procedure: Laminectomy and decompression L3-L4 posterior lumbar interbody arthrodesis with peek spacers local autograft allograft pedicle fixation L3-L5 posterior lateral arthrodesis with local autograft and allograft L3-L4. Surgeon: Kristeen Miss First Asst.: Francesca Jewett M.D. Anesthesia: Gen. endotracheal Indications: [name] is a 56 year old individual is had significant back and bilateral lower extremity pain worse on the right. She has had progressive degeneration of the L3-4 disc space which had responded well to conservative efforts but became refractory here in the last number of weeks. She was advised regarding the need for surgical decompression and arthrodesis.  Procedure: The patient was brought to the operating room supine on a stretcher. After the smooth induction of general endotracheal anesthesia, she was turned prone. The back was prepped with alcohol and DuraPrep and draped in a sterile fashion. An elliptical incision was created around her previous scar and this was excised. The dissection was carried down to the lumbar dorsal fascia which was opened on either side of midline to expose the spinous processes of L3 and L4. The dissection was then carried out to expose the hardware from her L4-L5 fusion. Next the transverse processes of L3 were cleared and pedicle entry sites were chosen at the L3 entry site. The lateral gutters were decorticated and packed off for later use in grafting. Laminectomy was created removing the inferior margin lamina of L3 including the entirety of the facet at the L3-L4 level. This was done bilaterally. The oh ligament was then taken up and the common dural tube was carefully protected and dissected away to expose on the right side a large amount of  severely degenerated disc material that was advanced in a degenerative state forming a caseating mass of material. This was excised using combination of curettes and rongeurs. This allowed for decompression of the common dural tube and particularly L4 nerve root inferiorly and the L3 nerve root superiorly. The disc space was then isolated. This was done bilaterally and the disc space was then entered with a 15 blade and a combination of curettes and rongeurs was used to evacuate the remainder of severely degenerated and desiccated disc material from within the disc space. The endplates were rongeured and curettaged to allow for good bony grafting surface. Interspace was distracted to allow placement of an 8 mm 4 lordotic spacer measuring 23 mm in length. This was packed with autograft and allograft and once both spacers were packed an additional 6 mL of bone graft was able to be packed into the interspace. Attention was then turned to the lateral gutters which were packed with the remainder of autograft and allograft the allografting demineralized bone matrix for total volume of 5 mL. Pedicle entry sites were then chosen at L3 under fluoroscopic guidance and 6.5 x 40 mm screws were placed into the L3 vertebral body. 6.5 x 40 mm screws were placed in L5 the L4 screw holes were left blank. 75 mm precontoured rods were then used to connect the screw heads together and this construct was closed in a slight bit of extension. A transverse connector was applied. Hemostasis in the soft tissues obtained meticulously and care was taken to make sure that the L3 nerve root superiorly and L4 nerve roots inferiorly were well decompressed. Once hemostasis was doubly checked the lumbar dorsal fascia was reapproximated with #1 Vicryl interrupted fashion 2-0 Vicryl in the  subcutaneous anus tissues and 3-0 Vicryl subcuticularly. 20 mL of half percent Marcaine was injected into the paraspinous fascia. Dermabond was placed on the skin  and a honeycomb dressing was placed over the wound. Patient tolerated procedure well blood loss is estimated at less than 200 mL.

## 2017-02-09 NOTE — Anesthesia Postprocedure Evaluation (Signed)
Anesthesia Post Note  Patient: [name]  Procedure(s) Performed: Procedure(s) (LRB): Lumbar Three-Four  Posterior lumbar interbody fusion (N/A)  Patient location during evaluation: PACU Anesthesia Type: General Level of consciousness: awake and alert Pain management: pain level controlled Vital Signs Assessment: post-procedure vital signs reviewed and stable Respiratory status: spontaneous breathing, nonlabored ventilation, respiratory function stable and patient connected to nasal cannula oxygen Cardiovascular status: blood pressure returned to baseline and stable Postop Assessment: no signs of nausea or vomiting Anesthetic complications: no       Last Vitals:  Vitals:   02/09/17 1235 02/09/17 1432  BP: 117/75 116/84  Pulse: 88 84  Resp: 16 18  Temp: 36.4 C 36.6 C    Last Pain:  Vitals:   02/09/17 1527  TempSrc:   PainSc: 7                  Tiajuana Amass

## 2017-02-09 NOTE — Anesthesia Procedure Notes (Signed)
Procedure Name: Intubation Date/Time: 02/09/2017 7:33 AM Performed by: Claris Che Pre-anesthesia Checklist: Patient identified, Emergency Drugs available, Suction available, Patient being monitored and Timeout performed Patient Re-evaluated:Patient Re-evaluated prior to inductionOxygen Delivery Method: Circle system utilized Preoxygenation: Pre-oxygenation with 100% oxygen Intubation Type: IV induction Ventilation: Mask ventilation without difficulty Laryngoscope Size: Mac and 3 Grade View: Grade I Tube type: Oral Tube size: 7.5 mm Number of attempts: 1 Airway Equipment and Method: Stylet Placement Confirmation: ETT inserted through vocal cords under direct vision,  positive ETCO2 and breath sounds checked- equal and bilateral Secured at: 23 cm Tube secured with: Tape Dental Injury: Teeth and Oropharynx as per pre-operative assessment

## 2017-02-10 ENCOUNTER — Inpatient Hospital Stay (HOSPITAL_COMMUNITY): Payer: BLUE CROSS/BLUE SHIELD

## 2017-02-10 LAB — GLUCOSE, CAPILLARY: Glucose-Capillary: 122 mg/dL — ABNORMAL HIGH (ref 65–99)

## 2017-02-10 MED ORDER — BUPROPION HCL ER (XL) 150 MG PO TB24
300.0000 mg | ORAL_TABLET | Freq: Every day | ORAL | Status: DC
  Administered 2017-02-10 – 2017-02-12 (×3): 300 mg via ORAL
  Filled 2017-02-10 (×3): qty 2

## 2017-02-10 MED ORDER — DEXAMETHASONE SODIUM PHOSPHATE 4 MG/ML IJ SOLN
4.0000 mg | Freq: Two times a day (BID) | INTRAMUSCULAR | Status: DC
  Administered 2017-02-10 (×2): 4 mg via INTRAVENOUS
  Filled 2017-02-10 (×2): qty 1

## 2017-02-10 NOTE — Evaluation (Signed)
Occupational Therapy Evaluation Patient Details Name: [name] MRN: [id number] DOB: Dec 06, 1961 Today's Date: 02/10/2017    History of Present Illness Admitted with Lumbar stenosis L3-L4 status post arthrodesis L4 to sacrum, herniated nucleus pulposus on the right L3-L4, lumbar radiculopathy. Now s/p Laminectomy and decompression L3-L4 posterior lumbar interbody arthrodesis with peek spacers local autograft allograft pedicle fixation L3-L5 posterior lateral arthrodesis with local autograft and allograft L3-L4.  has a past medical history of Adenomatous polyps; Anemia; Blood transfusion without reported diagnosis (01/1999); Bulging discs; Endometriosis; Hyperlipidemia; Hypertension; PVC (premature ventricular contraction); and Urinary incontinence.  has a past surgical history that includes Lumbar fusion (2010); Hernia repair; Abdominal hysterectomy (2001); Bladder suspension (2003); Spine surgery; and Breast surgery.   Clinical Impression   PTA Pt independent in ADL and mobility. Pt currently mod A for LB ADL and supervision for mobility with RW. Pt will benefit from skilled OT in the acute setting to maximize safety and independence in ADL and functional transfers. Next session to focus on AE education (please bring to practice, and please bring back precautions handout) for LB bathing and dressing. Pt's parents live with her and will not be able to help with LB ADL but are independent themselves.     Follow Up Recommendations  Supervision - Intermittent    Equipment Recommendations  3 in 1 bedside commode;Other (comment) (RW)    Recommendations for Other Services       Precautions / Restrictions Precautions Precautions: Back Precaution Booklet Issued: No Precaution Comments: Pt recalled 2/3 precautions, reviewed all 3 with Pt Required Braces or Orthoses: Spinal Brace Spinal Brace: Applied in sitting position Restrictions Weight Bearing Restrictions: No      Mobility Bed  Mobility Overal bed mobility: Needs Assistance Bed Mobility: Sidelying to Sit   Sidelying to sit: Mod assist       General bed mobility comments: vc for sequencing, and mod assist for trunk elevation  Transfers Overall transfer level: Needs assistance Equipment used: Rolling walker (2 wheeled) Transfers: Sit to/from Stand Sit to Stand: Min guard         General transfer comment: Cues for hand placement and back prec    Balance Overall balance assessment: No apparent balance deficits (not formally assessed)                                          ADL Overall ADL's : Needs assistance/impaired Eating/Feeding: Modified independent;Sitting   Grooming: Wash/dry hands;Min guard;Standing Grooming Details (indicate cue type and reason): good safety awareness with RW Upper Body Bathing: Set up;Sitting   Lower Body Bathing: Moderate assistance   Upper Body Dressing : Minimal assistance;Sitting Upper Body Dressing Details (indicate cue type and reason): to don brace Lower Body Dressing: Moderate assistance;Sit to/from stand   Toilet Transfer: Supervision/safety;Ambulation;BSC;RW Toilet Transfer Details (indicate cue type and reason): BSC over toilet in bathroom, good hand placement Toileting- Clothing Manipulation and Hygiene: Supervision/safety;Sit to/from stand Toileting - Clothing Manipulation Details (indicate cue type and reason): pants, underwear maintaining precautions Tub/ Shower Transfer: Walk-in shower;Supervision/safety;Ambulation;Rolling walker   Functional mobility during ADLs: Supervision/safety;Rolling walker General ADL Comments: good safety awareness, going home where she is with parents (57 y/o) and so they will not be able to assist with LB dressing     Vision Vision Assessment?: No apparent visual deficits   Perception     Praxis      Pertinent  Vitals/Pain Pain Assessment: 0-10 Pain Score: 6  Pain Location: back and groin; reports  nerve-related pain is much improved postop Pain Descriptors / Indicators: Aching;Grimacing;Operative site guarding Pain Intervention(s): Monitored during session;Repositioned;Premedicated before session     Hand Dominance Right   Extremity/Trunk Assessment Upper Extremity Assessment Upper Extremity Assessment: Overall WFL for tasks assessed   Lower Extremity Assessment Lower Extremity Assessment: Generalized weakness   Cervical / Trunk Assessment Cervical / Trunk Assessment: Other exceptions Cervical / Trunk Exceptions: post-op back surgery   Communication Communication Communication: No difficulties   Cognition Arousal/Alertness: Awake/alert Behavior During Therapy: WFL for tasks assessed/performed Overall Cognitive Status: Within Functional Limits for tasks assessed                     General Comments       Exercises       Shoulder Instructions      Home Living Family/patient expects to be discharged to:: Private residence Living Arrangements: Parent (She takes care of her 24 year old parents) Available Help at Discharge: Family;Available 24 hours/day (Daughter is staying with her for 2 days) Type of Home: House Home Access: Stairs to enter [employer] of Steps: 9 Entrance Stairs-Rails: Right;Left Home Layout: Two level;Able to live on main level with bedroom/bathroom     Bathroom Shower/Tub: Occupational psychologist: Standard Bathroom Accessibility: Yes How Accessible: Accessible via walker Home Equipment: None          Prior Functioning/Environment Level of Independence: Independent        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        OT Problem List: Decreased range of motion;Decreased activity tolerance;Decreased knowledge of use of DME or AE;Decreased knowledge of precautions;Pain   OT Treatment/Interventions: Self-care/ADL training;DME and/or AE instruction;Patient/family education    OT Goals(Current goals can be found in  the care plan section) Acute Rehab OT Goals Patient Stated Goal: back to work; walks a lot OT Goal Formulation: With patient Time For Goal Achievement: 02/17/17 Potential to Achieve Goals: Good ADL Goals Pt Will Perform Upper Body Dressing: with modified independence;sitting (maintaining back precautions) Pt Will Perform Lower Body Dressing: with modified independence;with adaptive equipment;sit to/from stand Additional ADL Goal #1: Pt will recall 3/3 back precautions Additional ADL Goal #2: Pt will don/doff brace modified independent  OT Frequency: Min 2X/week   Barriers to D/C:            Co-evaluation              End of Session Equipment Utilized During Treatment: Gait belt;Rolling walker;Back brace Nurse Communication: Mobility status;Precautions  Activity Tolerance: Patient tolerated treatment well Patient left: in chair;with call bell/phone within reach;with family/visitor present   Time: [id number] OT Time Calculation (min): 25 min Charges:  OT General Charges $OT Visit: 1 Procedure OT Evaluation $OT Eval Moderate Complexity: 1 Procedure OT Treatments $Self Care/Home Management : 8-22 mins G-Codes:    Merri Ray Jaray Boliver 24-Feb-2017, 4:43 PM Hulda Humphrey OTR/L [phone]

## 2017-02-10 NOTE — Progress Notes (Signed)
Patient ID: [name], female   DOB: December 23, 1961, 56 y.o.   MRN: [id number] Vital signs are stable Patient appear to have reaction to vancomycin yesterday with severe nausea and some breathing difficulties This is now resolved Her motor function is good She notes that her back is sore We'll plan mobilization today Patient may shower

## 2017-02-10 NOTE — Evaluation (Signed)
Physical Therapy Evaluation Patient Details Name: [name] MRN: [id number] DOB: October 20, 1961 Today's Date: 02/10/2017   History of Present Illness  Admitted with Lumbar stenosis L3-L4 status post arthrodesis L4 to sacrum, herniated nucleus pulposus on the right L3-L4, lumbar radiculopathy. Now s/p Laminectomy and decompression L3-L4 posterior lumbar interbody arthrodesis with peek spacers local autograft allograft pedicle fixation L3-L5 posterior lateral arthrodesis with local autograft and allograft L3-L4.  has a past medical history of Adenomatous polyps; Anemia; Blood transfusion without reported diagnosis (01/1999); Bulging discs; Endometriosis; Hyperlipidemia; Hypertension; PVC (premature ventricular contraction); and Urinary incontinence.  has a past surgical history that includes Lumbar fusion (2010); Hernia repair; Abdominal hysterectomy (2001); Bladder suspension (2003); Spine surgery; and Breast surgery.  Clinical Impression  Patient is s/p above surgery resulting in functional limitations due to the deficits listed below (see PT Problem List).  Patient will benefit from skilled PT to increase their independence and safety with mobility to allow discharge to the venue listed below.       Follow Up Recommendations Home health PT;Supervision/Assistance - 24 hour    Equipment Recommendations  Rolling walker with 5" wheels;3in1 (PT) (shower chair)    Recommendations for Other Services OT consult     Precautions / Restrictions Precautions Precautions: Back Precaution Booklet Issued: No Required Braces or Orthoses: Spinal Brace Spinal Brace: Applied in sitting position Restrictions Weight Bearing Restrictions: No      Mobility  Bed Mobility Overal bed mobility: Needs Assistance Bed Mobility: Sidelying to Sit   Sidelying to sit: Min assist       General bed mobility comments: Cues for technqiue with back prec  Transfers Overall transfer level: Needs  assistance Equipment used: Rolling walker (2 wheeled) Transfers: Sit to/from Stand Sit to Stand: Min guard         General transfer comment: Cues for hand placement and back prec  Ambulation/Gait Ambulation/Gait assistance: Min guard Ambulation Distance (Feet): 150 Feet Assistive device: Rolling walker (2 wheeled) Gait Pattern/deviations: Step-through pattern     General Gait Details: Cues to self-monitor for activity tolerance  Stairs            Wheelchair Mobility    Modified Rankin (Stroke Patients Only)       Balance                                             Pertinent Vitals/Pain Pain Assessment: 0-10 Pain Score: 5  Pain Location: back and groin; reports nerve-related pain is much improved postop Pain Descriptors / Indicators: Aching;Operative site guarding Pain Intervention(s): Monitored during session    Home Living Family/patient expects to be discharged to:: Private residence Living Arrangements: Alone Available Help at Discharge: Family;Available 24 hours/day Type of Home: House Home Access: Stairs to enter Entrance Stairs-Rails: Psychiatric nurse of Steps: 9 Home Layout: Two level;Able to live on main level with bedroom/bathroom Home Equipment: None      Prior Function Level of Independence: Independent         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     Hand Dominance        Extremity/Trunk Assessment   Upper Extremity Assessment Upper Extremity Assessment: Defer to OT evaluation    Lower Extremity Assessment Lower Extremity Assessment: Generalized weakness       Communication   Communication: No difficulties  Cognition Arousal/Alertness: Awake/alert Behavior During Therapy: [facility] for tasks assessed/performed  Overall Cognitive Status: Within Functional Limits for tasks assessed                      General Comments General comments (skin integrity, edema, etc.): Discussed back prec, and  how they relate to bed mobiltiy    Exercises     Assessment/Plan    PT Assessment Patient needs continued PT services  PT Problem List Decreased strength;Decreased activity tolerance;Decreased balance;Decreased mobility;Decreased knowledge of use of DME;Decreased knowledge of precautions;Pain          PT Treatment Interventions DME instruction;Gait training;Stair training;Functional mobility training;Therapeutic activities;Therapeutic exercise;Balance training;Patient/family education    PT Goals (Current goals can be found in the Care Plan section)  Acute Rehab PT Goals Patient Stated Goal: back to work; walks a lot PT Goal Formulation: With patient Time For Goal Achievement: 02/17/17 Potential to Achieve Goals: Good    Frequency Min 5X/week   Barriers to discharge        Co-evaluation               End of Session Equipment Utilized During Treatment: Back brace Activity Tolerance: Patient tolerated treatment well Patient left: in chair;with call bell/phone within reach Nurse Communication: Mobility status         Time: 0825-0900 PT Time Calculation (min) (ACUTE ONLY): 35 min   Charges:   PT Evaluation $PT Eval Low Complexity: 1 Procedure PT Treatments $Gait Training: 8-22 mins   PT G Codes:        Colletta Maryland 02/10/2017, 9:13 AM  Roney Marion, [place] Pager [phone] Office [phone]

## 2017-02-10 NOTE — Significant Event (Signed)
Pt c/o short of breath, chest tightness, itching, nausea and vomiting. She said that she is not feeling good after 15 mins infusion from the vancomycin.Placed pt on 2l O2. Called Rapid response and notified MD. CXR and Ekg done. New orders carried out and given. Will continue to monitor.

## 2017-02-10 NOTE — Procedures (Signed)
Responded to given pt prn treatment per RN.  Pt refused treatment stating that is makes her "shakey".  RT placed pt on 2L [place] for comfort and notified RN.

## 2017-02-11 ENCOUNTER — Encounter (HOSPITAL_COMMUNITY): Payer: Self-pay | Admitting: General Practice

## 2017-02-11 MED ORDER — DEXAMETHASONE 2 MG PO TABS
2.0000 mg | ORAL_TABLET | Freq: Two times a day (BID) | ORAL | Status: DC
  Administered 2017-02-11 – 2017-02-12 (×2): 2 mg via ORAL
  Filled 2017-02-11 (×2): qty 1

## 2017-02-11 MED FILL — Heparin Sodium (Porcine) Inj 1000 Unit/ML: INTRAMUSCULAR | Qty: 30 | Status: AC

## 2017-02-11 MED FILL — Sodium Chloride IV Soln 0.9%: INTRAVENOUS | Qty: 1000 | Status: AC

## 2017-02-11 NOTE — Progress Notes (Signed)
Occupational Therapy Treatment Patient Details Name: [name] MRN: [id number] DOB: November 12, 1961 Today's Date: 02/11/2017    History of present illness Admitted with Lumbar stenosis L3-L4 status post arthrodesis L4 to sacrum, herniated nucleus pulposus on the right L3-L4, lumbar radiculopathy. Now s/p Laminectomy and decompression L3-L4 posterior lumbar interbody arthrodesis with peek spacers local autograft allograft pedicle fixation L3-L5 posterior lateral arthrodesis with local autograft and allograft L3-L4.  has a past medical history of Adenomatous polyps; Anemia; Blood transfusion without reported diagnosis (01/1999); Bulging discs; Endometriosis; Hyperlipidemia; Hypertension; PVC (premature ventricular contraction); and Urinary incontinence.  has a past surgical history that includes Lumbar fusion (2010); Hernia repair; Abdominal hysterectomy (2001); Bladder suspension (2003); Spine surgery; and Breast surgery.   OT comments  Pt making progress towards OT goals this session. Focus of session was AE education for LB bathing/dressing. Pt able to practice and demonstrate competency with grabber/reacher, sock donner, long handle shoe horn, long handle sponge, and toileting aide. Pt able to don/doff brace with increased independence this session as well. Pt at adequate level to dc from OT perspective. If Pt still here, next session to focus on bed mobility (flat bed, no rails).  Follow Up Recommendations  Supervision - Intermittent    Equipment Recommendations  3 in 1 bedside commode;Other (comment) (AE kit; RW)    Recommendations for Other Services      Precautions / Restrictions Precautions Precautions: Back Precaution Booklet Issued: Yes (comment) Precaution Comments: Pt recalled 3/3 precautions this session Required Braces or Orthoses: Spinal Brace Spinal Brace: Applied in sitting position;Lumbar corset Restrictions Weight Bearing Restrictions: No       Mobility Bed  Mobility Overal bed mobility: Needs Assistance Bed Mobility: Rolling;Sidelying to Sit;Sit to Sidelying Rolling: Supervision Sidelying to sit: Min guard (use of rails)     Sit to sidelying: Min guard;HOB elevated (use of rails) General bed mobility comments: vc for sequencing, use of bed rails to assist  Transfers Overall transfer level: Needs assistance Equipment used: Rolling walker (2 wheeled) Transfers: Sit to/from Stand Sit to Stand: Supervision         General transfer comment: good hand placement    Balance Overall balance assessment: No apparent balance deficits (not formally assessed)                                 ADL Overall ADL's : Needs assistance/impaired             Lower Body Bathing: Supervison/ safety;With adaptive equipment;Cueing for sequencing;Sit to/from stand Lower Body Bathing Details (indicate cue type and reason): educated in long handle sponge Upper Body Dressing : Supervision/safety;Sitting Upper Body Dressing Details (indicate cue type and reason): to don brace Lower Body Dressing: Min guard;With adaptive equipment;Sit to/from stand Lower Body Dressing Details (indicate cue type and reason): educated and practiced with grabber, sock donner, long handle shoe horn Toilet Transfer: Supervision/safety;Ambulation;BSC;RW Toilet Transfer Details (indicate cue type and reason): BSC over toilet in bathroom, good hand placement Toileting- Clothing Manipulation and Hygiene: Supervision/safety;Sit to/from stand Toileting - Clothing Manipulation Details (indicate cue type and reason): pants, underwear maintaining precautions; educated in toileting aide and demonstrated how to fold paper     Functional mobility during ADLs: Supervision/safety;Rolling walker General ADL Comments: AE edcuation complete      Vision                     Perception     Praxis  Cognition   Behavior During Therapy: WFL for tasks  assessed/performed Overall Cognitive Status: Within Functional Limits for tasks assessed                       Extremity/Trunk Assessment               Exercises     Shoulder Instructions       General Comments      Pertinent Vitals/ Pain       Pain Assessment: 0-10 Pain Score: 8  Pain Location: back Pain Descriptors / Indicators: Operative site guarding;Sore;Discomfort Pain Intervention(s): Monitored during session;Repositioned;Patient requesting pain meds-RN notified  Home Living                                          Prior Functioning/Environment              Frequency  Min 2X/week        Progress Toward Goals  OT Goals(current goals can now be found in the care plan section)  Progress towards OT goals: Progressing toward goals  Acute Rehab OT Goals Patient Stated Goal: back to work; walks a lot OT Goal Formulation: With patient Time For Goal Achievement: 02/17/17 Potential to Achieve Goals: Good  Plan Discharge plan remains appropriate    Co-evaluation                 End of Session Equipment Utilized During Treatment: Gait belt;Rolling walker;Back brace;Other (comment) (AE kit)   Activity Tolerance Patient tolerated treatment well   Patient Left in bed;with call bell/phone within reach   Nurse Communication Mobility status;Patient requests pain meds;Precautions        Time: 1443-1540 OT Time Calculation (min): 38 min  Charges: OT General Charges $OT Visit: 1 Procedure OT Treatments $Self Care/Home Management : 38-52 mins  Jaci Carrel 02/11/2017, 9:58 AM Hulda Humphrey OTR/L [phone]

## 2017-02-11 NOTE — Progress Notes (Signed)
Physical Therapy Treatment Patient Details Name: [name] MRN: [id number] DOB: Oct 29, 1961 Today's Date: 02/11/2017    History of Present Illness Admitted with Lumbar stenosis L3-L4 status post arthrodesis L4 to sacrum, herniated nucleus pulposus on the right L3-L4, lumbar radiculopathy. Now s/p Laminectomy and decompression L3-L4 posterior lumbar interbody arthrodesis with peek spacers local autograft allograft pedicle fixation L3-L5 posterior lateral arthrodesis with local autograft and allograft L3-L4.  has a past medical history of Adenomatous polyps; Anemia; Blood transfusion without reported diagnosis (01/1999); Bulging discs; Endometriosis; Hyperlipidemia; Hypertension; PVC (premature ventricular contraction); and Urinary incontinence.  has a past surgical history that includes Lumbar fusion (2010); Hernia repair; Abdominal hysterectomy (2001); Bladder suspension (2003); Spine surgery; and Breast surgery.    PT Comments    Good progress with mobility, including getting in and out of flat bed without rails; Stair training complete; on track for dc home tomorrow   Follow Up Recommendations  Home health PT;Supervision/Assistance - 24 hour     Equipment Recommendations  Rolling walker with 5" wheels;3in1 (PT) (shower chair)    Recommendations for Other Services       Precautions / Restrictions Precautions Precautions: Back Precaution Booklet Issued: Yes (comment) Precaution Comments: Pt recalled 3/3 precautions this session Required Braces or Orthoses: Spinal Brace Spinal Brace: Applied in sitting position;Lumbar corset    Mobility  Bed Mobility Overal bed mobility: Needs Assistance Bed Mobility: Rolling;Sidelying to Sit;Sit to Sidelying Rolling: Supervision Sidelying to sit: Min guard     Sit to sidelying: Min guard General bed mobility comments: Cues for technqiue without rails; minguard, but did not need physical assist; Discussed use of stepstool to help get into  high bed  Transfers Overall transfer level: Needs assistance Equipment used: Rolling walker (2 wheeled) Transfers: Sit to/from Stand Sit to Stand: Supervision         General transfer comment: good hand placement  Ambulation/Gait Ambulation/Gait assistance: Min guard (without physical contact) Ambulation Distance (Feet): 120 Feet Assistive device: Rolling walker (2 wheeled) Gait Pattern/deviations: Step-through pattern     General Gait Details: Cues to self-monitor for activity tolerance   Stairs Stairs: Yes   Stair Management: One rail Left;Step to pattern;Sideways Number of Stairs: 4 General stair comments: Cues for technqiue; alos practiced goin up one step backwards to simulate using stepstool to get into bed  Wheelchair Mobility    Modified Rankin (Stroke Patients Only)       Balance Overall balance assessment: No apparent balance deficits (not formally assessed)                                  Cognition Arousal/Alertness: Awake/alert Behavior During Therapy: WFL for tasks assessed/performed Overall Cognitive Status: Within Functional Limits for tasks assessed                      Exercises      General Comments        Pertinent Vitals/Pain Pain Assessment: 0-10 Pain Score: 6  Pain Location: back Pain Descriptors / Indicators: Operative site guarding;Sore;Discomfort Pain Intervention(s): Monitored during session    Home Living                      Prior Function            PT Goals (current goals can now be found in the care plan section) Acute Rehab PT Goals Patient Stated Goal: back to work;  walks a lot PT Goal Formulation: With patient Time For Goal Achievement: 02/17/17 Potential to Achieve Goals: Good Progress towards PT goals: Progressing toward goals    Frequency    Min 5X/week      PT Plan Current plan remains appropriate    Co-evaluation             End of Session Equipment  Utilized During Treatment: Back brace Activity Tolerance: Patient tolerated treatment well Patient left: in bed;with call bell/phone within reach;with family/visitor present Nurse Communication: Mobility status PT Visit Diagnosis: Unsteadiness on feet (R26.81);Pain     Time: 1337-1401 PT Time Calculation (min) (ACUTE ONLY): 24 min  Charges:  $Gait Training: 8-22 mins $Therapeutic Activity: 8-22 mins                    G Codes:       Colletta Maryland 2017-03-05, 2:42 PM  Roney Marion, [place]  Acute Rehabilitation Services Pager [phone] Office [phone]

## 2017-02-11 NOTE — Progress Notes (Signed)
Patient ID: [name], female   DOB: [birth year], 56 y.o.   MRN: [id number] Signs stable Motor function appears intact She is very sore today Plan is for discharge tomorrow Will discontinue telemetry

## 2017-02-11 NOTE — Care Management Note (Signed)
Case Management Note  Patient Details  Name: [name] MRN: [id number] Date of Birth: 11-Jul-1961  Subjective/Objective:           Patient was admitted for a Lumbar Three-Four  Posterior lumbar interbody fusion.  Lives at home alone. CM will follow for discharge needs pending PT/OT evals and physician orders.          Action/Plan:   Expected Discharge Date:                  Expected Discharge Plan:     In-House Referral:     Discharge planning Services     Post Acute Care Choice:    Choice offered to:     DME Arranged:    DME Agency:     HH Arranged:    HH Agency:     Status of Service:     If discussed at [employer] of Stay Meetings, dates discussed:    Additional Comments:  Rolm Baptise, RN 02/11/2017, 9:52 AM

## 2017-02-12 MED ORDER — DEXAMETHASONE 1 MG PO TABS: ORAL_TABLET | ORAL | 0 refills | Status: DC

## 2017-02-12 MED ORDER — METHOCARBAMOL 500 MG PO TABS: 500.0000 mg | ORAL_TABLET | Freq: Four times a day (QID) | ORAL | 3 refills | Status: DC | PRN

## 2017-02-12 MED ORDER — OXYCODONE HCL 5 MG PO TABS: 5.0000 mg | ORAL_TABLET | ORAL | 0 refills | Status: DC | PRN

## 2017-02-12 NOTE — Progress Notes (Signed)
PT Cancellation Note  Patient Details Name: [name] MRN: [id number] DOB: 11/14/1961   Cancelled Treatment:    Reason Eval/Treat Not Completed: Other (comment) (Pt reviewing d/c paper with RN.  Pt reports she does not have any questions in regards to mobility and would like to d/c.  PTA offered review of stair training and mobility but patient declined politely.  )   Doryce Mcgregory Eli Hose 02/12/2017, 1:10 PM Governor Rooks, PTA pager [phone]

## 2017-02-12 NOTE — Discharge Summary (Signed)
Physician Discharge Summary  Patient ID: [name] MRN: [id number] DOB/AGE: 04/17/1961 56 y.o.  Admit date: 02/09/2017 Discharge date: 02/12/2017  Admission Diagnoses:Lumbar stenosis L3-L4 with radiculopathy status post arthrodesis L4 to sacrum  Discharge Diagnoses: Lumbar stenosis L3-L4 with radiculopathy, status post arthrodesis L4 to sacrum Active Problems:   Lumbar stenosis with neurogenic claudication   Lumbar stenosis   Discharged Condition: good  Hospital Course: Patient was admitted to undergo surgical decompression and arthrodesis of L3-4 after having had a previous decompression fusion L4 to sacrum. She tolerated surgery well.  Consults: None  Significant Diagnostic Studies: None  Treatments: surgery: Decompression and fusion L3-L4 with posterior lateral arthrodesis L3-L4 and posterior lumbar interbody arthrodesis L3-L4  Discharge Exam: Blood pressure (!) 116/53, pulse 82, temperature 97.9 F (36.6 C), temperature source Oral, resp. rate 18, height 5\' 5"  (1.651 m), weight 61.4 kg (135 lb 4.2 oz), SpO2 99 %. Incision is clean and dry. Motor function is intact.  Disposition: 01-Home or Self Care  Discharge Instructions    Call MD for:  redness, tenderness, or signs of infection (pain, swelling, redness, odor or green/yellow discharge around incision site)    Complete by:  As directed    Call MD for:  severe uncontrolled pain    Complete by:  As directed    Call MD for:  temperature >100.4    Complete by:  As directed    Diet - low sodium heart healthy    Complete by:  As directed    Incentive spirometry RT    Complete by:  As directed    Incentive spirometry RT    Complete by:  As directed    Increase activity slowly    Complete by:  As directed      Allergies as of 02/12/2017      Reactions   Ceftin [cefuroxime] Nausea And Vomiting   Penicillins Hives   Has patient had a PCN reaction causing immediate rash, facial/tongue/throat swelling, SOB or  lightheadedness with hypotension: Yes Has patient had a PCN reaction causing severe rash involving mucus membranes or skin necrosis:Yes Has patient had a PCN reaction that required hospitalization:No Has patient had a PCN reaction occurring within the last 10 years: No If all of the above answers are "NO", then may proceed with Cephalosporin use.      Medication List    TAKE these medications   albuterol 108 (90 Base) MCG/ACT inhaler Commonly known as:  PROVENTIL HFA;VENTOLIN HFA Inhale 2 puffs into the lungs every 4 (four) hours as needed for wheezing or shortness of breath (cough, shortness of breath or wheezing.).   ALPRAZolam 0.5 MG tablet Commonly known as:  XANAX Take 0.5 mg by mouth 2 (two) times daily as needed. For anxiety   buPROPion 300 MG 24 hr tablet Commonly known as:  WELLBUTRIN XL Take 300 mg by mouth daily.   chlorpheniramine-HYDROcodone 10-8 MG/5ML Suer Commonly known as:  TUSSIONEX PENNKINETIC ER Take 5 mLs by mouth every 12 (twelve) hours as needed for cough.   dexamethasone 1 MG tablet Commonly known as:  DECADRON 2 tablets twice daily for 2 days, one tablet twice daily for 2 days, one tablet daily for 2 days.   doxycycline 100 MG capsule Commonly known as:  VIBRAMYCIN Take 1 capsule (100 mg total) by mouth 2 (two) times daily.   erythromycin ophthalmic ointment Commonly known as:  ROMYCIN Place 1 application into the right eye 4 (four) times daily.   estradiol 1 MG tablet Commonly known  as:  ESTRACE Take 1 tablet (1 mg total) by mouth every morning. What changed:  when to take this   losartan 100 MG tablet Commonly known as:  COZAAR Take 100 mg by mouth daily.   methocarbamol 500 MG tablet Commonly known as:  ROBAXIN Take 1 tablet (500 mg total) by mouth every 6 (six) hours as needed for muscle spasms.   metoprolol succinate 25 MG 24 hr tablet Commonly known as:  TOPROL-XL Take 25 mg by mouth daily.   naproxen sodium 220 MG tablet Commonly  known as:  ANAPROX Take 220 mg by mouth 2 (two) times daily.   oxyCODONE 5 MG immediate release tablet Commonly known as:  Oxy IR/ROXICODONE Take 1-2 tablets (5-10 mg total) by mouth every 3 (three) hours as needed for severe pain.   oxyCODONE-acetaminophen 5-325 MG tablet Commonly known as:  PERCOCET/ROXICET Take 1 tablet by mouth every 6 (six) hours as needed (for pain.).   rosuvastatin 10 MG tablet Commonly known as:  CRESTOR Take 10 mg by mouth daily.   triamterene-hydrochlorothiazide 37.5-25 MG tablet Commonly known as:  MAXZIDE-25 Take 1 tablet by mouth daily.   zolpidem 10 MG tablet Commonly known as:  AMBIEN Take 10 mg by mouth at bedtime as needed for sleep.            Durable Medical Equipment        Start     Ordered   02/12/17 1138  For home use only DME Walker  [facility])  Once    Question:  Patient needs a walker to treat with the following condition  Answer:  Lumbar stenosis   02/12/17 1138   02/12/17 1023  For home use only DME 3 n 1  Once     02/12/17 1023   02/12/17 1023  For home use only DME Walker rolling  Once    Question:  Patient needs a walker to treat with the following condition  Answer:  Status post lumbar surgery   02/12/17 1023       Signed: Earleen Newport 02/12/2017, 11:38 AM

## 2017-02-12 NOTE — Progress Notes (Signed)
D/c instructions reviewed with pt and her family. Copy of instructions and scripts given to pt. Pt d/c'd with belongings, equipment for home use, and aspen lumbar brace on, via wheelchair escorted by hospital volunteer.

## 2017-02-12 NOTE — Care Management Note (Signed)
Case Management Note  Patient Details  Name: [name] MRN: [id number] Date of Birth: 1961-05-05  Subjective/Objective:                    Action/Plan: Pt discharging home with orders for [facility] services. CM met with the patient and provided her a list of [facility] agencies. She selected [place]. Santiago Glad with [facility] notified and accepted the referral.  Pt also with orders for walker and 3 in 1. Jermaine with [facility] DME notified and will deliver the equipment to the room.  Pt states she has transportation home.    Expected Discharge Date:  02/12/17               Expected Discharge Plan:  [place]  In-House Referral:     Discharge planning Services  CM Consult  Post Acute Care Choice:  Durable Medical Equipment, Home Health Choice offered to:  Patient  DME Arranged:  3-N-1, Walker rolling DME Agency:  [place]:  PT [place]:  [place]  Status of Service:  Completed, signed off  If discussed at [place] of Stay Meetings, dates discussed:    Additional Comments:  Pollie Friar, RN 02/12/2017, 12:00 PM

## 2017-03-07 ENCOUNTER — Other Ambulatory Visit: Payer: Self-pay | Admitting: Obstetrics and Gynecology

## 2017-03-27 ENCOUNTER — Other Ambulatory Visit: Payer: Self-pay | Admitting: Obstetrics and Gynecology

## 2017-03-27 DIAGNOSIS — Z1231 Encounter for screening mammogram for malignant neoplasm of breast: Secondary | ICD-10-CM

## 2017-03-27 NOTE — Progress Notes (Signed)
56 y.o. [id number] Divorced Caucasian female here for annual exam.    Will get her mammogram next week.   Wants to continue ERT.   Has her labs with her PCP.  Has a low Vit D.  Had a cardiac ECHO and stress test which were normal per patient.  This was done due to elevated blood pressure.  Had a spinal fusion and is out of work currently.  Up twice a night to void.  Voids often.  Still with stress incontinence.  Does not want any evaluation or tx for this.  Father has been ill recently.  Had GI bleed.  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   PCP:  Dr. Sandi Mariscal   No LMP recorded. Patient has had a hysterectomy.           Sexually active: Yes.    The current method of family planning is status post hysterectomy.    Exercising: Yes  Physcial Thereapy Smoker:  no  Health Maintenance: Pap: 2013 normal  History of abnormal Pap:  Yes, Hx of cryotherapy to cervix in 1995. MMG: 01-10-15 3D/Saline Implants/Density B/Neg/BiRads1:TBC--Has APPT. 04-15-17 Colonoscopy: ???10-24-15 Dr.Outlaw.  Doing every 5 years.  BMD: 2011  Result:normal TDaP:  Up to date through work Gardasil:   N/A HIV: Yes Hep C: Yes Screening Labs: PCP  Hb today: PCP, Urine today: PCP   reports that she has been smoking Cigarettes.  She has never used smokeless tobacco. She reports that she drinks about 3.6 - 4.8 oz of alcohol per week . She reports that she does not use drugs.  Past Medical History:  Diagnosis Date  . Adenomatous polyps   . Anemia   . Blood transfusion without reported diagnosis 01/1999   following hysterectomy  . Bulging discs   . Endometriosis   . Hyperlipidemia   . Hypertension   . PVC (premature ventricular contraction)   . Urinary incontinence     Past Surgical History:  Procedure Laterality Date  . ABDOMINAL HYSTERECTOMY  2001   TAH/RSO  . BLADDER SUSPENSION  2003   LSO and rectocele repair at [facility]  . BREAST SURGERY     breast augmentation--saline implants  . HERNIA  REPAIR    . LUMBAR FUSION  2010   Dr. Carloyn Manner  . SPINE SURGERY      Current Outpatient Prescriptions  Medication Sig Dispense Refill  . albuterol (PROVENTIL HFA;VENTOLIN HFA) 108 (90 BASE) MCG/ACT inhaler Inhale 2 puffs into the lungs every 4 (four) hours as needed for wheezing or shortness of breath (cough, shortness of breath or wheezing.). 1 Inhaler 1  . ALPRAZolam (XANAX) 0.5 MG tablet Take 0.5 mg by mouth 2 (two) times daily as needed. For anxiety     . buPROPion (WELLBUTRIN XL) 300 MG 24 hr tablet Take 300 mg by mouth daily.    Marland Kitchen estradiol (ESTRACE) 1 MG tablet Take 1 tablet (1 mg total) by mouth every morning. (Patient taking differently: Take 1 mg by mouth daily. ) 90 tablet 3  . losartan (COZAAR) 100 MG tablet Take 100 mg by mouth daily.     . methocarbamol (ROBAXIN) 500 MG tablet Take 1 tablet (500 mg total) by mouth every 6 (six) hours as needed for muscle spasms. 40 tablet 3  . metoprolol succinate (TOPROL-XL) 25 MG 24 hr tablet Take 25 mg by mouth daily.     . naproxen sodium (ANAPROX) 220 MG tablet Take 220 mg by mouth 2 (two) times daily.    Marland Kitchen  oxyCODONE (OXY IR/ROXICODONE) 5 MG immediate release tablet Take 1-2 tablets (5-10 mg total) by mouth every 3 (three) hours as needed for severe pain. 60 tablet 0  . rosuvastatin (CRESTOR) 10 MG tablet Take 10 mg by mouth daily.    Marland Kitchen triamterene-hydrochlorothiazide (MAXZIDE-25) 37.5-25 MG tablet Take 1 tablet by mouth daily.    Marland Kitchen zolpidem (AMBIEN) 10 MG tablet Take 10 mg by mouth at bedtime as needed for sleep.     No current facility-administered medications for this visit.     Family History  Problem Relation Age of Onset  . Hypertension Mother   . Hypertension Father   . Cancer Father   . Hyperlipidemia Sister   . Hypertension Sister   . Hyperlipidemia Brother   . Hypertension Brother   . Hypertension Brother   . Hypertension Brother   . Cancer Maternal Uncle 72    colon ca  . Breast cancer Maternal Grandmother   . Cancer  Maternal Grandmother 55    colon ca    ROS:  Pertinent items are noted in HPI.  Otherwise, a comprehensive ROS was negative.  Exam:   BP 130/90 (BP Location: Right Arm, Patient Position: Sitting, Cuff Size: Normal)   Pulse 80   Resp 20   Ht 5' 3.75" (1.619 m)   Wt 140 lb 9.6 oz (63.8 kg)   BMI 24.32 kg/m     General appearance: alert, cooperative and appears stated age Head: Normocephalic, without obvious abnormality, atraumatic Neck: no adenopathy, supple, symmetrical, trachea midline and thyroid normal to inspection and palpation Lungs: clear to auscultation bilaterally Breasts: bilateral implants, normal appearance, no masses or tenderness, No nipple retraction or dimpling, No nipple discharge or bleeding, No axillary or supraclavicular adenopathy Heart: regular rate and rhythm Abdomen: vertical midline and Pfannenstiel incisions, soft, non-tender; no masses, no organomegaly Extremities: extremities normal, atraumatic, no cyanosis or edema Skin: Skin color, texture, turgor normal. No rashes or lesions Lymph nodes: Cervical, supraclavicular, and axillary nodes normal. No abnormal inguinal nodes palpated Neurologic: Grossly normal  Pelvic: External genitalia:  no lesions              Urethra:  normal appearing urethra with no masses, tenderness or lesions              Bartholins and Skenes: normal                 Vagina: normal appearing vagina with normal color and discharge, no lesions.  Good support.              Cervix:  Absent.              Pap taken: No. Bimanual Exam:  Uterus:  Absent.              Adnexa: no mass, fullness, tenderness              Rectal exam: Yes.  .  Confirms.              Anus:  normal sphincter tone, no lesions  Chaperone was present for exam.  Assessment:   Well woman visit with normal exam. Status post TAH/RSO. Remote hx abnormal paps. Status post LSO/sacrocolpopexy.  Status post midurethral sling.  ERT patient.  Urinary urgency and  frequency.  Stress incontinence. Bilateral breast implants.  Plan: Mammogram screening discussed. Recommended self breast awareness. Pap and HR HPV as above. Guidelines for Calcium, Vitamin D, regular exercise program including cardiovascular and weight bearing exercise. Will continue  with Estrace 1 mg daily.  I discussed WHI and risks of stroke, DVT, PE and possible breast cancer. After her mammogram is back and normal, I can give her a refill of the Estrace for one year.  Follow up annually and prn.   After visit summary provided.

## 2017-03-29 ENCOUNTER — Encounter: Payer: Self-pay | Admitting: Obstetrics and Gynecology

## 2017-03-29 ENCOUNTER — Ambulatory Visit (INDEPENDENT_AMBULATORY_CARE_PROVIDER_SITE_OTHER): Payer: BLUE CROSS/BLUE SHIELD | Admitting: Obstetrics and Gynecology

## 2017-03-29 VITALS — BP 130/90 | HR 80 | Resp 20 | Ht 63.75 in | Wt 140.6 lb

## 2017-03-29 DIAGNOSIS — Z01419 Encounter for gynecological examination (general) (routine) without abnormal findings: Secondary | ICD-10-CM

## 2017-03-29 MED ORDER — ESTRADIOL 1 MG PO TABS: 1.0000 mg | ORAL_TABLET | Freq: Every day | ORAL | 0 refills | Status: DC

## 2017-03-29 NOTE — Patient Instructions (Signed)

## 2017-04-10 ENCOUNTER — Telehealth: Payer: Self-pay | Admitting: Obstetrics and Gynecology

## 2017-04-10 MED ORDER — ESTRADIOL 1 MG PO TABS: 1.0000 mg | ORAL_TABLET | Freq: Every day | ORAL | 0 refills | Status: DC

## 2017-04-10 NOTE — Telephone Encounter (Signed)
Ok for Estrace 1 mg daily for 90 days with no refill. Please send to her pharmacy of choice. Patient is overdue on her mammogram.  I will refill after her results are back and normal.  Thank you.

## 2017-04-10 NOTE — Telephone Encounter (Signed)
Patient says in order for insurance to pay for her estrogen prescription the pharmacy will need it written for a 90 day supply.  [facility] at [phone].

## 2017-04-10 NOTE — Telephone Encounter (Signed)
Dr. Quincy Simmonds -patient requesting 90 day supply for estradiol sent on 03/29/17 for #30/0RF. Patient scheduled for MMG on 04/15/17, last MMG 01/10/18 -BI-RADS CATEGORY  1: Negative. Please advise on request?

## 2017-04-10 NOTE — Telephone Encounter (Signed)
Spoke with patient, advised as seen below per Dr. Quincy Simmonds. Patient verbalizes understanding and is agreeable.   New order placed for Estrace #90/0RF.   Routing to provider for final review. Patient is agreeable to disposition. Will close encounter.

## 2017-04-15 ENCOUNTER — Ambulatory Visit
Admission: RE | Admit: 2017-04-15 | Discharge: 2017-04-15 | Disposition: A | Discharge status: Home / Self Care | Payer: BLUE CROSS/BLUE SHIELD | Source: Ambulatory Visit | Attending: Obstetrics and Gynecology | Admitting: Obstetrics and Gynecology

## 2017-04-15 DIAGNOSIS — Z1231 Encounter for screening mammogram for malignant neoplasm of breast: Secondary | ICD-10-CM

## 2017-06-30 ENCOUNTER — Encounter (HOSPITAL_COMMUNITY): Payer: Self-pay

## 2017-06-30 ENCOUNTER — Emergency Department (HOSPITAL_COMMUNITY)
Admission: EM | Admit: 2017-06-30 | Discharge: 2017-07-01 | Disposition: A | Discharge status: Home / Self Care | Payer: BLUE CROSS/BLUE SHIELD | Attending: Emergency Medicine | Admitting: Emergency Medicine

## 2017-06-30 DIAGNOSIS — R51 Headache: Secondary | ICD-10-CM | POA: Diagnosis not present

## 2017-06-30 DIAGNOSIS — Z981 Arthrodesis status: Secondary | ICD-10-CM | POA: Insufficient documentation

## 2017-06-30 DIAGNOSIS — Z79899 Other long term (current) drug therapy: Secondary | ICD-10-CM | POA: Insufficient documentation

## 2017-06-30 DIAGNOSIS — R109 Unspecified abdominal pain: Secondary | ICD-10-CM | POA: Diagnosis present

## 2017-06-30 DIAGNOSIS — Z87891 Personal history of nicotine dependence: Secondary | ICD-10-CM | POA: Diagnosis not present

## 2017-06-30 DIAGNOSIS — E860 Dehydration: Secondary | ICD-10-CM | POA: Diagnosis not present

## 2017-06-30 DIAGNOSIS — N289 Disorder of kidney and ureter, unspecified: Secondary | ICD-10-CM | POA: Diagnosis not present

## 2017-06-30 DIAGNOSIS — I10 Essential (primary) hypertension: Secondary | ICD-10-CM | POA: Insufficient documentation

## 2017-06-30 DIAGNOSIS — F4321 Adjustment disorder with depressed mood: Secondary | ICD-10-CM | POA: Diagnosis present

## 2017-06-30 DIAGNOSIS — F1290 Cannabis use, unspecified, uncomplicated: Secondary | ICD-10-CM | POA: Diagnosis not present

## 2017-06-30 LAB — COMPREHENSIVE METABOLIC PANEL
ALT: 40 U/L (ref 14–54)
AST: 49 U/L — ABNORMAL HIGH (ref 15–41)
Albumin: 3.7 g/dL (ref 3.5–5.0)
Alkaline Phosphatase: 56 U/L (ref 38–126)
Anion gap: 13 (ref 5–15)
BUN: 65 mg/dL — ABNORMAL HIGH (ref 6–20)
CO2: 20 mmol/L — ABNORMAL LOW (ref 22–32)
Calcium: 8.2 mg/dL — ABNORMAL LOW (ref 8.9–10.3)
Chloride: 105 mmol/L (ref 101–111)
Creatinine, Ser: 1.68 mg/dL — ABNORMAL HIGH (ref 0.44–1.00)
GFR calc Af Amer: 38 mL/min — ABNORMAL LOW (ref >60)
GFR calc non Af Amer: 33 mL/min — ABNORMAL LOW (ref >60)
Glucose, Bld: 118 mg/dL — ABNORMAL HIGH (ref 65–99)
Potassium: 3.8 mmol/L (ref 3.5–5.1)
Sodium: 138 mmol/L (ref 135–145)
Total Bilirubin: 0.6 mg/dL (ref 0.3–1.2)
Total Protein: 6.9 g/dL (ref 6.5–8.1)

## 2017-06-30 LAB — POC OCCULT BLOOD, ED: Fecal Occult Bld: POSITIVE — AB

## 2017-06-30 LAB — RAPID URINE DRUG SCREEN, HOSP PERFORMED
Amphetamines: NOT DETECTED
Barbiturates: NOT DETECTED
Benzodiazepines: NOT DETECTED
Cocaine: NOT DETECTED
Opiates: NOT DETECTED
Tetrahydrocannabinol: NOT DETECTED

## 2017-06-30 LAB — I-STAT CHEM 8, ED
BUN: 46 mg/dL — ABNORMAL HIGH (ref 6–20)
Calcium, Ion: 0.98 mmol/L — ABNORMAL LOW (ref 1.15–1.40)
Chloride: 109 mmol/L (ref 101–111)
Creatinine, Ser: 1.3 mg/dL — ABNORMAL HIGH (ref 0.44–1.00)
Glucose, Bld: 107 mg/dL — ABNORMAL HIGH (ref 65–99)
HCT: 32 % — ABNORMAL LOW (ref 36.0–46.0)
Hemoglobin: 10.9 g/dL — ABNORMAL LOW (ref 12.0–15.0)
Potassium: 3.4 mmol/L — ABNORMAL LOW (ref 3.5–5.1)
Sodium: 139 mmol/L (ref 135–145)
TCO2: 20 mmol/L (ref 0–100)

## 2017-06-30 LAB — CBC WITH DIFFERENTIAL/PLATELET
Basophils Absolute: 0 10*3/uL (ref 0.0–0.1)
Basophils Relative: 0 %
Eosinophils Absolute: 0 10*3/uL (ref 0.0–0.7)
Eosinophils Relative: 0 %
HCT: 36.6 % (ref 36.0–46.0)
Hemoglobin: 12.5 g/dL (ref 12.0–15.0)
Lymphocytes Relative: 15 %
Lymphs Abs: 1.4 10*3/uL (ref 0.7–4.0)
MCH: 33.5 pg (ref 26.0–34.0)
MCHC: 34.2 g/dL (ref 30.0–36.0)
MCV: 98.1 fL (ref 78.0–100.0)
Monocytes Absolute: 0.6 10*3/uL (ref 0.1–1.0)
Monocytes Relative: 7 %
Neutro Abs: 7.2 10*3/uL (ref 1.7–7.7)
Neutrophils Relative %: 78 %
Platelets: 430 10*3/uL — ABNORMAL HIGH (ref 150–400)
RBC: 3.73 MIL/uL — ABNORMAL LOW (ref 3.87–5.11)
RDW: 13.8 % (ref 11.5–15.5)
WBC: 9.2 10*3/uL (ref 4.0–10.5)

## 2017-06-30 LAB — I-STAT BETA HCG BLOOD, ED (MC, WL, AP ONLY): I-stat hCG, quantitative: <5 m[IU]/mL (ref <5)

## 2017-06-30 LAB — ACETAMINOPHEN LEVEL: Acetaminophen (Tylenol), Serum: <10 ug/mL — ABNORMAL LOW (ref 10–30)

## 2017-06-30 LAB — ETHANOL: Alcohol, Ethyl (B): 212 mg/dL — ABNORMAL HIGH (ref <5)

## 2017-06-30 LAB — SALICYLATE LEVEL: Salicylate Lvl: <7 mg/dL (ref 2.8–30.0)

## 2017-06-30 MED ORDER — SODIUM CHLORIDE 0.9 % IV BOLUS (SEPSIS)
1000.0000 mL | Freq: Once | INTRAVENOUS | Status: AC
  Administered 2017-06-30: 1000 mL via INTRAVENOUS

## 2017-06-30 MED ORDER — ONDANSETRON HCL 4 MG/2ML IJ SOLN
4.0000 mg | Freq: Once | INTRAMUSCULAR | Status: AC
  Administered 2017-06-30: 4 mg via INTRAVENOUS
  Filled 2017-06-30: qty 2

## 2017-06-30 MED ORDER — SODIUM CHLORIDE 0.9 % IV SOLN
INTRAVENOUS | Status: DC
  Administered 2017-07-01: via INTRAVENOUS

## 2017-06-30 MED ORDER — LORAZEPAM 1 MG PO TABS: 0.0000 mg | ORAL_TABLET | Freq: Two times a day (BID) | ORAL | Status: DC

## 2017-06-30 MED ORDER — LORAZEPAM 2 MG/ML IJ SOLN: 0.0000 mg | Freq: Two times a day (BID) | INTRAMUSCULAR | Status: DC

## 2017-06-30 MED ORDER — LORAZEPAM 1 MG PO TABS
0.0000 mg | ORAL_TABLET | Freq: Four times a day (QID) | ORAL | Status: DC
  Administered 2017-06-30: 2 mg via ORAL
  Filled 2017-06-30: qty 2

## 2017-06-30 MED ORDER — OXYCODONE HCL 5 MG PO TABS
5.0000 mg | ORAL_TABLET | Freq: Once | ORAL | Status: AC
  Administered 2017-06-30: 5 mg via ORAL
  Filled 2017-06-30: qty 1

## 2017-06-30 MED ORDER — LORAZEPAM 2 MG/ML IJ SOLN
0.0000 mg | Freq: Four times a day (QID) | INTRAMUSCULAR | Status: DC
Start: 2017-06-30 — End: 2017-07-01
  Administered 2017-07-01: 2 mg via INTRAVENOUS
  Administered 2017-07-01: 1 mg via INTRAVENOUS
  Filled 2017-06-30 (×2): qty 1

## 2017-06-30 MED ORDER — THIAMINE HCL 100 MG/ML IJ SOLN
100.0000 mg | Freq: Once | INTRAMUSCULAR | Status: AC
  Administered 2017-06-30: 100 mg via INTRAVENOUS
  Filled 2017-06-30: qty 2

## 2017-06-30 MED ORDER — SODIUM CHLORIDE 0.9 % IV SOLN
80.0000 mg | Freq: Once | INTRAVENOUS | Status: AC
  Administered 2017-06-30: 80 mg via INTRAVENOUS
  Filled 2017-06-30: qty 80

## 2017-06-30 MED ORDER — SODIUM CHLORIDE 0.9 % IV BOLUS (SEPSIS)
2000.0000 mL | Freq: Once | INTRAVENOUS | Status: AC
  Administered 2017-06-30: 2000 mL via INTRAVENOUS

## 2017-06-30 NOTE — ED Provider Notes (Signed)
Patient admits to heavy alcohol intake over the last few weeks. Currently denying any suicidal ideation. She is now more calm. Initial elevation of kidney function has improved with IV fluids, likely due to dehydration. She has had some episodic GI bleed but hemoglobin is stable. Denies any abdominal pain. TTS consulted and recommended psychiatric evaluation in the morning. Patient has voluntary and agrees with plan.   Julianne Rice, MD 06/30/17 2350
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
## 2017-07-01 DIAGNOSIS — Z87891 Personal history of nicotine dependence: Secondary | ICD-10-CM

## 2017-07-01 DIAGNOSIS — F1290 Cannabis use, unspecified, uncomplicated: Secondary | ICD-10-CM

## 2017-07-01 DIAGNOSIS — F4321 Adjustment disorder with depressed mood: Secondary | ICD-10-CM

## 2017-07-01 HISTORY — DX: Adjustment disorder with depressed mood: F43.21

## 2017-07-01 MED ORDER — GABAPENTIN 300 MG PO CAPS: 300.0000 mg | ORAL_CAPSULE | Freq: Three times a day (TID) | ORAL | 0 refills | Status: DC

## 2017-07-01 MED ORDER — GABAPENTIN 300 MG PO CAPS: 300.0000 mg | ORAL_CAPSULE | Freq: Three times a day (TID) | ORAL | Status: DC

## 2017-07-01 NOTE — BH Assessment (Signed)
[facility] Assessment Progress Note  Per Corena Pilgrim, MD, this pt does not require psychiatric hospitalization at this time.  Pt reportedly intends to follow up with [facility].  Discharge instructions advise her to follow through with this plan, and include contact information for the group.  Pt's nurse has been notified.  Jalene Mullet, [place] Triage Specialist [phone]

## 2017-07-01 NOTE — Consult Note (Signed)
[facility] Consult   Reason for Consult:  IVC'd, depression Referring Physician:  EDP Patient Identification: [name] MRN:  [id number] Principal Diagnosis: Adjustment disorder with depressed mood Diagnosis:   Patient Active Problem List   Diagnosis Date Noted  . Adjustment disorder with depressed mood [F43.21] 07/01/2017    Priority: High  . Lumbar stenosis with neurogenic claudication [M48.062] 02/09/2017  . Lumbar stenosis [M48.061] 02/09/2017  . Mixed incontinence [N39.46] 08/05/2014  . HTN (hypertension) [I10] 02/07/2012  . Depression [F32.9] 02/07/2012  . Dyslipidemia [E78.5] 02/07/2012  . Menopausal and perimenopausal disorder [N95.9] 02/07/2012    Total Time spent with patient: 45 minutes  Subjective:   [name] is a 56 y.o. female patient does not warrant admission.  HPI:  56 yo female who presented to the ED with depression and concerns from her exhusband.  She had a back fusion last month which failed and her back pain continues.  Ms. Yarbro started drinking to help the pain and her depression but does not feel she is dependent on it nor that she needs treatment.  No withdrawal symptoms noted.  She is willing to go to counseling and see a psychiatrist for her depression, already have them selected.  No suicidal/homicidal ideations, hallucinations.  Stable for discharge.  Past Psychiatric History: depression  Risk to Self: Suicidal Ideation: No Suicidal Intent: No Is patient at risk for suicide?: No Suicidal Plan?: No Access to Means: No What has been your use of drugs/alcohol within the last 12 months?: reports to daily alcohol use  Triggers for Past Attempts: Unpredictable Intentional Self Injurious Behavior: None Risk to Others: Homicidal Ideation: No Thoughts of Harm to Others: No Current Homicidal Intent: No Current Homicidal Plan: No Access to Homicidal Means: No History of harm to others?: No Assessment of Violence: None  Noted Does patient have access to weapons?: No Criminal Charges Pending?: No Does patient have a court date: No Prior Inpatient Therapy: Prior Inpatient Therapy: No Prior Outpatient Therapy: Prior Outpatient Therapy: Yes Prior Therapy Dates: "years ago" Prior Therapy Facilty/Provider(s): unable to recall, pt states she never went back when she found out her therapist was friends with someone she was dating  Reason for Treatment: MDD Does patient have an ACCT team?: No Does patient have Intensive In-House Services?  : No Does patient have [facility] services? : No Does patient have P4CC services?: No  Past Medical History:  Past Medical History:  Diagnosis Date  . Adenomatous polyps   . Anemia   . Blood transfusion without reported diagnosis 01/1999   following hysterectomy  . Bulging discs   . Endometriosis   . Hyperlipidemia   . Hypertension   . PVC (premature ventricular contraction)   . Urinary incontinence     Past Surgical History:  Procedure Laterality Date  . ABDOMINAL HYSTERECTOMY  2001   TAH/RSO  . AUGMENTATION MAMMAPLASTY Bilateral 2013   saline. pre pectoral  . BLADDER SUSPENSION  2003   LSO and rectocele repair at [facility]  . BREAST SURGERY     breast augmentation--saline implants  . HERNIA REPAIR    . LUMBAR FUSION  2010   Dr. Carloyn Manner  . REDUCTION MAMMAPLASTY Bilateral   . SPINE SURGERY     Family History:  Family History  Problem Relation Age of Onset  . Hypertension Mother   . Hypertension Father   . Cancer Father   . Hyperlipidemia Sister   . Hypertension Sister   . Hyperlipidemia Brother   . Hypertension Brother   .  Hypertension Brother   . Hypertension Brother   . Cancer Maternal Uncle 72       colon ca  . Breast cancer Maternal Grandmother   . Cancer Maternal Grandmother 14       colon ca   Family Psychiatric  History: none Social History:  History  Alcohol Use  . 3.6 - 4.8 oz/week  . 6 - 8 Standard drinks or equivalent per week    Comment: 1  glass wine per night     History  Drug Use  . Types: Marijuana    Social History   Social History  . Marital status: Divorced    Spouse name: N/A  . Number of children: 3  . Years of education: N/A   Social History Main Topics  . Smoking status: Former Smoker    Types: Cigarettes  . Smokeless tobacco: Never Used  . Alcohol use 3.6 - 4.8 oz/week    6 - 8 Standard drinks or equivalent per week     Comment: 1 glass wine per night  . Drug use: Yes    Types: Marijuana  . Sexual activity: Yes    Partners: Male    Birth control/ protection: Surgical     Comment: TAH   Other Topics Concern  . None   Social History Narrative  . None   Additional Social History:    Allergies:   Allergies  Allergen Reactions  . Ceftin [Cefuroxime] Nausea And Vomiting  . Penicillins Hives    Has patient had a PCN reaction causing immediate rash, facial/tongue/throat swelling, SOB or lightheadedness with hypotension: Yes Has patient had a PCN reaction causing severe rash involving mucus membranes or skin necrosis:Yes Has patient had a PCN reaction that required hospitalization:No Has patient had a PCN reaction occurring within the last 10 years: No If all of the above answers are "NO", then may proceed with Cephalosporin use.   . Vancomycin Nausea And Vomiting, Swelling and Rash    Labs:  Results for orders placed or performed during the hospital encounter of 06/30/17 (from the past 48 hour(s))  CBC with Differential/Platelet     Status: Abnormal   Collection Time: 06/30/17  1:53 PM  Result Value Ref Range   WBC 9.2 4.0 - 10.5 K/uL   RBC 3.73 (L) 3.87 - 5.11 MIL/uL   Hemoglobin 12.5 12.0 - 15.0 g/dL   HCT 36.6 36.0 - 46.0 %   MCV 98.1 78.0 - 100.0 fL   MCH 33.5 26.0 - 34.0 pg   MCHC 34.2 30.0 - 36.0 g/dL   RDW 13.8 11.5 - 15.5 %   Platelets 430 (H) 150 - 400 K/uL   Neutrophils Relative % 78 %   Neutro Abs 7.2 1.7 - 7.7 K/uL   Lymphocytes Relative 15 %   Lymphs Abs 1.4 0.7 - 4.0  K/uL   Monocytes Relative 7 %   Monocytes Absolute 0.6 0.1 - 1.0 K/uL   Eosinophils Relative 0 %   Eosinophils Absolute 0.0 0.0 - 0.7 K/uL   Basophils Relative 0 %   Basophils Absolute 0.0 0.0 - 0.1 K/uL  Comprehensive metabolic panel     Status: Abnormal   Collection Time: 06/30/17  1:53 PM  Result Value Ref Range   Sodium 138 135 - 145 mmol/L   Potassium 3.8 3.5 - 5.1 mmol/L   Chloride 105 101 - 111 mmol/L   CO2 20 (L) 22 - 32 mmol/L   Glucose, Bld 118 (H) 65 - 99 mg/dL  BUN 65 (H) 6 - 20 mg/dL   Creatinine, Ser 1.68 (H) 0.44 - 1.00 mg/dL   Calcium 8.2 (L) 8.9 - 10.3 mg/dL   Total Protein 6.9 6.5 - 8.1 g/dL   Albumin 3.7 3.5 - 5.0 g/dL   AST 49 (H) 15 - 41 U/L   ALT 40 14 - 54 U/L   Alkaline Phosphatase 56 38 - 126 U/L   Total Bilirubin 0.6 0.3 - 1.2 mg/dL   GFR calc non Af Amer 33 (L) >60 mL/min   GFR calc Af Amer 38 (L) >60 mL/min    Comment: (NOTE) The eGFR has been calculated using the CKD EPI equation. This calculation has not been validated in all clinical situations. eGFR's persistently <60 mL/min signify possible Chronic Kidney Disease.    Anion gap 13 5 - 15  Ethanol     Status: Abnormal   Collection Time: 06/30/17  1:54 PM  Result Value Ref Range   Alcohol, Ethyl (B) 212 (H) <5 mg/dL    Comment:        LOWEST DETECTABLE LIMIT FOR SERUM ALCOHOL IS 5 mg/dL FOR MEDICAL PURPOSES ONLY   Salicylate level     Status: None   Collection Time: 06/30/17  1:54 PM  Result Value Ref Range   Salicylate Lvl <1.7 2.8 - 30.0 mg/dL  Acetaminophen level     Status: Abnormal   Collection Time: 06/30/17  1:54 PM  Result Value Ref Range   Acetaminophen (Tylenol), Serum <10 (L) 10 - 30 ug/mL    Comment:        THERAPEUTIC CONCENTRATIONS VARY SIGNIFICANTLY. A RANGE OF 10-30 ug/mL MAY BE AN EFFECTIVE CONCENTRATION FOR MANY PATIENTS. HOWEVER, SOME ARE BEST TREATED AT CONCENTRATIONS OUTSIDE THIS RANGE. ACETAMINOPHEN CONCENTRATIONS >150 ug/mL AT 4 HOURS AFTER INGESTION AND  >50 ug/mL AT 12 HOURS AFTER INGESTION ARE OFTEN ASSOCIATED WITH TOXIC REACTIONS.   I-Stat beta hCG blood, ED     Status: None   Collection Time: 06/30/17  2:37 PM  Result Value Ref Range   I-stat hCG, quantitative <5.0 <5 mIU/mL   Comment 3            Comment:   GEST. AGE      CONC.  (mIU/mL)   <=1 WEEK        5 - 50     2 WEEKS       50 - 500     3 WEEKS       100 - 10,000     4 WEEKS     1,000 - 30,000        FEMALE AND NON-PREGNANT FEMALE:     LESS THAN 5 mIU/mL   POC occult blood, ED     Status: Abnormal   Collection Time: 06/30/17  3:28 PM  Result Value Ref Range   Fecal Occult Bld POSITIVE (A) NEGATIVE  Rapid urine drug screen (hospital performed)     Status: None   Collection Time: 06/30/17  3:49 PM  Result Value Ref Range   Opiates NONE DETECTED NONE DETECTED   Cocaine NONE DETECTED NONE DETECTED   Benzodiazepines NONE DETECTED NONE DETECTED   Amphetamines NONE DETECTED NONE DETECTED   Tetrahydrocannabinol NONE DETECTED NONE DETECTED   Barbiturates NONE DETECTED NONE DETECTED    Comment:        DRUG SCREEN FOR MEDICAL PURPOSES ONLY.  IF CONFIRMATION IS NEEDED FOR ANY PURPOSE, NOTIFY LAB WITHIN 5 DAYS.        LOWEST  DETECTABLE LIMITS FOR URINE DRUG SCREEN Drug Class       Cutoff (ng/mL) Amphetamine      1000 Barbiturate      200 Benzodiazepine   409 Tricyclics       811 Opiates          300 Cocaine          300 THC              50   I-Stat Chem 8, ED     Status: Abnormal   Collection Time: 06/30/17  6:10 PM  Result Value Ref Range   Sodium 139 135 - 145 mmol/L   Potassium 3.4 (L) 3.5 - 5.1 mmol/L   Chloride 109 101 - 111 mmol/L   BUN 46 (H) 6 - 20 mg/dL   Creatinine, Ser 1.30 (H) 0.44 - 1.00 mg/dL   Glucose, Bld 107 (H) 65 - 99 mg/dL   Calcium, Ion 0.98 (L) 1.15 - 1.40 mmol/L   TCO2 20 0 - 100 mmol/L   Hemoglobin 10.9 (L) 12.0 - 15.0 g/dL   HCT 32.0 (L) 36.0 - 46.0 %    Current Facility-Administered Medications  Medication Dose Route Frequency  Provider Last Rate Last Dose  . 0.9 %  sodium chloride infusion   Intravenous Continuous Julianne Rice, MD   Stopped at 07/01/17 0700  . gabapentin (NEURONTIN) capsule 300 mg  300 mg Oral TID Patrecia Pour, NP      . LORazepam (ATIVAN) injection 0-4 mg  0-4 mg Intravenous Q6H Julianne Rice, MD   1 mg at 07/01/17 9147   Or  . LORazepam (ATIVAN) tablet 0-4 mg  0-4 mg Oral Q6H Julianne Rice, MD   2 mg at 06/30/17 1812  . [START ON 07/03/2017] LORazepam (ATIVAN) injection 0-4 mg  0-4 mg Intravenous Q12H Julianne Rice, MD       Or  . Derrill Memo ON 07/03/2017] LORazepam (ATIVAN) tablet 0-4 mg  0-4 mg Oral Q12H Julianne Rice, MD       Current Outpatient Prescriptions  Medication Sig Dispense Refill  . ALPRAZolam (XANAX) 0.5 MG tablet Take 0.5 mg by mouth 2 (two) times daily as needed. For anxiety     . buPROPion (WELLBUTRIN XL) 300 MG 24 hr tablet Take 300 mg by mouth daily.    Marland Kitchen estradiol (ESTRACE) 1 MG tablet Take 1 tablet (1 mg total) by mouth daily. 90 tablet 0  . losartan (COZAAR) 100 MG tablet Take 100 mg by mouth daily.     . metoprolol succinate (TOPROL-XL) 25 MG 24 hr tablet Take 25 mg by mouth daily.     . rosuvastatin (CRESTOR) 10 MG tablet Take 10 mg by mouth daily.    Marland Kitchen triamterene-hydrochlorothiazide (MAXZIDE-25) 37.5-25 MG tablet Take 1 tablet by mouth daily.    Marland Kitchen zolpidem (AMBIEN) 10 MG tablet Take 10 mg by mouth at bedtime as needed for sleep.    Marland Kitchen albuterol (PROVENTIL HFA;VENTOLIN HFA) 108 (90 BASE) MCG/ACT inhaler Inhale 2 puffs into the lungs every 4 (four) hours as needed for wheezing or shortness of breath (cough, shortness of breath or wheezing.). (Patient not taking: Reported on 06/30/2017) 1 Inhaler 1  . gabapentin (NEURONTIN) 300 MG capsule Take 1 capsule (300 mg total) by mouth 3 (three) times daily. 90 capsule 0  . methocarbamol (ROBAXIN) 500 MG tablet Take 1 tablet (500 mg total) by mouth every 6 (six) hours as needed for muscle spasms. (Patient not taking:  Reported on 06/30/2017) 40 tablet  3  . oxyCODONE (OXY IR/ROXICODONE) 5 MG immediate release tablet Take 1-2 tablets (5-10 mg total) by mouth every 3 (three) hours as needed for severe pain. (Patient not taking: Reported on 06/30/2017) 60 tablet 0    Musculoskeletal: Strength & Muscle Tone: within normal limits Gait & Station: normal Patient leans: N/A  Psychiatric Specialty Exam: Physical Exam  Constitutional: She is oriented to person, place, and time. She appears well-developed and well-nourished.  HENT:  Head: Normocephalic.  Neck: Normal range of motion.  Respiratory: Effort normal.  Musculoskeletal: Normal range of motion.  Neurological: She is alert and oriented to person, place, and time.  Psychiatric: Her speech is normal and behavior is normal. Judgment and thought content normal. Cognition and memory are normal. She exhibits a depressed mood.    Review of Systems  Psychiatric/Behavioral: Positive for depression.  All other systems reviewed and are negative.   Blood pressure (!) 145/86, pulse 88, temperature 99.1 F (37.3 C), temperature source Oral, resp. rate 19, height 5' 5" (1.651 m), weight 62.6 kg (138 lb), SpO2 98 %.Body mass index is 22.96 kg/m.  General Appearance: Casual  Eye Contact:  Good  Speech:  Normal Rate  Volume:  Normal  Mood:  Depressed, mild  Affect:  Congruent  Thought Process:  Coherent and Descriptions of Associations: Intact  Orientation:  Full (Time, Place, and Person)  Thought Content:  WDL and Logical  Suicidal Thoughts:  No  Homicidal Thoughts:  No  Memory:  Immediate;   Good Recent;   Good Remote;   Good  Judgement:  Fair  Insight:  Fair  Psychomotor Activity:  Normal  Concentration:  Concentration: Good and Attention Span: Good  Recall:  Good  Fund of Knowledge:  Good  Language:  Good  Akathisia:  No  Handed:  Right  AIMS (if indicated):     Assets:  Communication Skills Desire for Improvement Financial  Resources/Insurance Housing Leisure Time Physical Health Resilience Social Support Talents/Skills Transportation Vocational/Educational  ADL's:  Intact  Cognition:  WNL  Sleep:        Treatment Plan Summary: Daily contact with patient to assess and evaluate symptoms and progress in treatment, Medication management and Plan adjustment disorder with depressed mood:  -Crisis stabilization -Medication management:  Continued medical medications and started Gabapentin 300 mg TID for neuropathic pain and prevent any issues from alcohol cessation -Individual counseling  Disposition: No evidence of imminent risk to self or others at present.    Waylan Boga, NP 07/01/2017 10:58 AM  Patient seen face-to-face for psychiatric evaluation, chart reviewed and case discussed with the physician extender and developed treatment plan. Reviewed the information documented and agree with the treatment plan. Corena Pilgrim, MD

## 2017-07-01 NOTE — [facility] Suicide Risk Assessment (Signed)
Suicide Risk Assessment  Discharge Assessment   [facility] Discharge Suicide Risk Assessment   Principal Problem: Adjustment disorder with depressed mood Discharge Diagnoses:  Patient Active Problem List   Diagnosis Date Noted  . Adjustment disorder with depressed mood [F43.21] 07/01/2017    Priority: High  . Lumbar stenosis with neurogenic claudication [M48.062] 02/09/2017  . Lumbar stenosis [M48.061] 02/09/2017  . Mixed incontinence [N39.46] 08/05/2014  . HTN (hypertension) [I10] 02/07/2012  . Depression [F32.9] 02/07/2012  . Dyslipidemia [E78.5] 02/07/2012  . Menopausal and perimenopausal disorder [N95.9] 02/07/2012    Total Time spent with patient: 45 minutes  Musculoskeletal: Strength & Muscle Tone: within normal limits Gait & Station: normal Patient leans: N/A  Psychiatric Specialty Exam: Physical Exam  Constitutional: She is oriented to person, place, and time. She appears well-developed and well-nourished.  HENT:  Head: Normocephalic.  Neck: Normal range of motion.  Respiratory: Effort normal.  Musculoskeletal: Normal range of motion.  Neurological: She is alert and oriented to person, place, and time.  Psychiatric: Her speech is normal and behavior is normal. Judgment and thought content normal. Cognition and memory are normal. She exhibits a depressed mood.    Review of Systems  Psychiatric/Behavioral: Positive for depression.  All other systems reviewed and are negative.   Blood pressure (!) 145/86, pulse 88, temperature 99.1 F (37.3 C), temperature source Oral, resp. rate 19, height 5\' 5"  (1.651 m), weight 62.6 kg (138 lb), SpO2 98 %.Body mass index is 22.96 kg/m.  General Appearance: Casual  Eye Contact:  Good  Speech:  Normal Rate  Volume:  Normal  Mood:  Depressed, mild  Affect:  Congruent  Thought Process:  Coherent and Descriptions of Associations: Intact  Orientation:  Full (Time, Place, and Person)  Thought Content:  WDL and Logical  Suicidal  Thoughts:  No  Homicidal Thoughts:  No  Memory:  Immediate;   Good Recent;   Good Remote;   Good  Judgement:  Fair  Insight:  Fair  Psychomotor Activity:  Normal  Concentration:  Concentration: Good and Attention Span: Good  Recall:  Good  Fund of Knowledge:  Good  Language:  Good  Akathisia:  No  Handed:  Right  AIMS (if indicated):     Assets:  Communication Skills Desire for Improvement Financial Resources/Insurance Housing Leisure Time Physical Health Resilience Social Support Talents/Skills Transportation Vocational/Educational  ADL's:  Intact  Cognition:  WNL  Sleep:       Mental Status Per Nursing Assessment::   On Admission:   depression  Demographic Factors:  Caucasian and Living alone  Loss Factors: NA  Historical Factors: NA  Risk Reduction Factors:   Sense of responsibility to family and Positive social support  Continued Clinical Symptoms:  Depression, mild  Cognitive Features That Contribute To Risk:  None    Suicide Risk:  Minimal: No identifiable suicidal ideation.  Patients presenting with no risk factors but with morbid ruminations; may be classified as minimal risk based on the severity of the depressive symptoms    Plan Of Care/Follow-up recommendations:  Activity:  as tolerated Diet:  heart healthy diet  Linkin Vizzini, NP 07/01/2017, 2:18 PM

## 2017-07-01 NOTE — Discharge Instructions (Signed)
For you behavioral health needs, you are advised to follow up with [facility].  Contact them at your earliest opportunity to schedule an intake appointment:       [facility]      [address] [place], [place] [address]      [phone]

## 2017-08-30 ENCOUNTER — Ambulatory Visit
Admission: RE | Admit: 2017-08-30 | Discharge: 2017-08-30 | Disposition: A | Discharge status: Home / Self Care | Payer: BLUE CROSS/BLUE SHIELD | Source: Ambulatory Visit | Attending: Family Medicine | Admitting: Family Medicine

## 2017-08-30 ENCOUNTER — Other Ambulatory Visit: Payer: Self-pay | Admitting: Family Medicine

## 2017-08-30 DIAGNOSIS — R222 Localized swelling, mass and lump, trunk: Secondary | ICD-10-CM

## 2017-09-02 ENCOUNTER — Other Ambulatory Visit: Payer: Self-pay | Admitting: Family Medicine

## 2017-09-02 ENCOUNTER — Other Ambulatory Visit: Payer: Self-pay

## 2017-09-02 DIAGNOSIS — R471 Dysarthria and anarthria: Secondary | ICD-10-CM

## 2017-09-02 DIAGNOSIS — G510 Bell's palsy: Secondary | ICD-10-CM

## 2017-09-02 DIAGNOSIS — R270 Ataxia, unspecified: Secondary | ICD-10-CM

## 2017-09-04 ENCOUNTER — Other Ambulatory Visit: Payer: Self-pay | Admitting: Obstetrics and Gynecology

## 2017-09-04 NOTE — Telephone Encounter (Signed)
Medication refill request: Estadiol  Last AEX:  03/29/17 BS Next AEX: spoke with patient. Patient states that she will call back tomorrow to schedule next AEX Last MMG (if hormonal medication request): 04/16/27 BIRADS 1 negative/density b Refill authorized: 04/10/17 #90 w/0 refills; today please advise, Dr. Quincy Simmonds out of office.

## 2017-09-06 ENCOUNTER — Ambulatory Visit
Admission: RE | Admit: 2017-09-06 | Discharge: 2017-09-06 | Disposition: A | Discharge status: Home / Self Care | Payer: BLUE CROSS/BLUE SHIELD | Source: Ambulatory Visit | Attending: Family Medicine | Admitting: Family Medicine

## 2017-09-06 DIAGNOSIS — G510 Bell's palsy: Secondary | ICD-10-CM

## 2017-09-06 DIAGNOSIS — R471 Dysarthria and anarthria: Secondary | ICD-10-CM

## 2017-09-06 DIAGNOSIS — R270 Ataxia, unspecified: Secondary | ICD-10-CM

## 2017-09-06 MED ORDER — GADOBENATE DIMEGLUMINE 529 MG/ML IV SOLN
10.0000 mL | Freq: Once | INTRAVENOUS | Status: AC | PRN
  Administered 2017-09-06: 10 mL via INTRAVENOUS

## 2017-09-12 ENCOUNTER — Other Ambulatory Visit: Payer: Self-pay | Admitting: Family Medicine

## 2017-09-12 DIAGNOSIS — R519 Headache, unspecified: Secondary | ICD-10-CM

## 2017-09-12 DIAGNOSIS — D72829 Elevated white blood cell count, unspecified: Secondary | ICD-10-CM

## 2017-09-12 DIAGNOSIS — R51 Headache: Principal | ICD-10-CM

## 2017-09-12 DIAGNOSIS — R29810 Facial weakness: Secondary | ICD-10-CM

## 2017-11-25 ENCOUNTER — Other Ambulatory Visit: Payer: Self-pay | Admitting: Ophthalmology

## 2017-11-25 DIAGNOSIS — H04021 Chronic dacryoadenitis, right lacrimal gland: Secondary | ICD-10-CM

## 2017-12-09 ENCOUNTER — Ambulatory Visit: Payer: BLUE CROSS/BLUE SHIELD | Admitting: Neurology

## 2017-12-10 ENCOUNTER — Ambulatory Visit: Payer: BLUE CROSS/BLUE SHIELD | Admitting: Neurology

## 2017-12-27 ENCOUNTER — Ambulatory Visit
Admission: RE | Admit: 2017-12-27 | Discharge: 2017-12-27 | Disposition: A | Discharge status: Home / Self Care | Payer: BLUE CROSS/BLUE SHIELD | Source: Ambulatory Visit | Attending: Ophthalmology | Admitting: Ophthalmology

## 2017-12-27 DIAGNOSIS — H04021 Chronic dacryoadenitis, right lacrimal gland: Secondary | ICD-10-CM

## 2018-01-27 DIAGNOSIS — E7800 Pure hypercholesterolemia, unspecified: Secondary | ICD-10-CM | POA: Insufficient documentation

## 2018-03-06 ENCOUNTER — Other Ambulatory Visit: Payer: Self-pay | Admitting: Ophthalmology

## 2018-05-02 ENCOUNTER — Other Ambulatory Visit: Payer: Self-pay | Admitting: Family Medicine

## 2018-05-02 DIAGNOSIS — M5416 Radiculopathy, lumbar region: Secondary | ICD-10-CM

## 2018-05-12 ENCOUNTER — Ambulatory Visit
Admission: RE | Admit: 2018-05-12 | Discharge: 2018-05-12 | Disposition: A | Discharge status: Home / Self Care | Payer: BLUE CROSS/BLUE SHIELD | Source: Ambulatory Visit | Attending: Family Medicine | Admitting: Family Medicine

## 2018-05-12 DIAGNOSIS — M5416 Radiculopathy, lumbar region: Secondary | ICD-10-CM

## 2018-05-12 MED ORDER — IOPAMIDOL (ISOVUE-M 200) INJECTION 41%
1.0000 mL | Freq: Once | INTRAMUSCULAR | Status: AC
  Administered 2018-05-12: 1 mL via EPIDURAL

## 2018-05-12 MED ORDER — METHYLPREDNISOLONE ACETATE 40 MG/ML INJ SUSP (RADIOLOG
120.0000 mg | Freq: Once | INTRAMUSCULAR | Status: AC
  Administered 2018-05-12: 120 mg via EPIDURAL

## 2018-06-18 ENCOUNTER — Encounter (HOSPITAL_COMMUNITY): Payer: Self-pay

## 2018-06-18 ENCOUNTER — Emergency Department (HOSPITAL_COMMUNITY): Payer: BLUE CROSS/BLUE SHIELD

## 2018-06-18 ENCOUNTER — Inpatient Hospital Stay (HOSPITAL_COMMUNITY)
Admission: EM | Admit: 2018-06-18 | Discharge: 2018-06-20 | DRG: 063 | Disposition: A | Discharge status: Home / Self Care | Payer: BLUE CROSS/BLUE SHIELD | Attending: Neurology | Admitting: Neurology

## 2018-06-18 ENCOUNTER — Inpatient Hospital Stay (HOSPITAL_COMMUNITY): Payer: BLUE CROSS/BLUE SHIELD

## 2018-06-18 DIAGNOSIS — E860 Dehydration: Secondary | ICD-10-CM | POA: Diagnosis present

## 2018-06-18 DIAGNOSIS — Z881 Allergy status to other antibiotic agents status: Secondary | ICD-10-CM | POA: Diagnosis not present

## 2018-06-18 DIAGNOSIS — Z880 Allergy status to penicillin: Secondary | ICD-10-CM

## 2018-06-18 DIAGNOSIS — I493 Ventricular premature depolarization: Secondary | ICD-10-CM | POA: Diagnosis present

## 2018-06-18 DIAGNOSIS — Z8349 Family history of other endocrine, nutritional and metabolic diseases: Secondary | ICD-10-CM | POA: Diagnosis not present

## 2018-06-18 DIAGNOSIS — I959 Hypotension, unspecified: Secondary | ICD-10-CM | POA: Diagnosis present

## 2018-06-18 DIAGNOSIS — R29810 Facial weakness: Secondary | ICD-10-CM | POA: Diagnosis present

## 2018-06-18 DIAGNOSIS — Z79899 Other long term (current) drug therapy: Secondary | ICD-10-CM

## 2018-06-18 DIAGNOSIS — I10 Essential (primary) hypertension: Secondary | ICD-10-CM | POA: Diagnosis present

## 2018-06-18 DIAGNOSIS — I6529 Occlusion and stenosis of unspecified carotid artery: Secondary | ICD-10-CM | POA: Diagnosis present

## 2018-06-18 DIAGNOSIS — Z8249 Family history of ischemic heart disease and other diseases of the circulatory system: Secondary | ICD-10-CM

## 2018-06-18 DIAGNOSIS — Z87891 Personal history of nicotine dependence: Secondary | ICD-10-CM | POA: Diagnosis not present

## 2018-06-18 DIAGNOSIS — E785 Hyperlipidemia, unspecified: Secondary | ICD-10-CM | POA: Diagnosis present

## 2018-06-18 DIAGNOSIS — R9431 Abnormal electrocardiogram [ECG] [EKG]: Secondary | ICD-10-CM | POA: Diagnosis present

## 2018-06-18 DIAGNOSIS — Z86010 Personal history of colonic polyps: Secondary | ICD-10-CM | POA: Diagnosis not present

## 2018-06-18 DIAGNOSIS — Z803 Family history of malignant neoplasm of breast: Secondary | ICD-10-CM

## 2018-06-18 DIAGNOSIS — J40 Bronchitis, not specified as acute or chronic: Secondary | ICD-10-CM

## 2018-06-18 DIAGNOSIS — F419 Anxiety disorder, unspecified: Secondary | ICD-10-CM

## 2018-06-18 DIAGNOSIS — Z981 Arthrodesis status: Secondary | ICD-10-CM

## 2018-06-18 DIAGNOSIS — D649 Anemia, unspecified: Secondary | ICD-10-CM | POA: Diagnosis present

## 2018-06-18 DIAGNOSIS — E876 Hypokalemia: Secondary | ICD-10-CM | POA: Diagnosis present

## 2018-06-18 DIAGNOSIS — R079 Chest pain, unspecified: Secondary | ICD-10-CM

## 2018-06-18 DIAGNOSIS — J329 Chronic sinusitis, unspecified: Secondary | ICD-10-CM | POA: Diagnosis present

## 2018-06-18 DIAGNOSIS — Z8673 Personal history of transient ischemic attack (TIA), and cerebral infarction without residual deficits: Secondary | ICD-10-CM | POA: Diagnosis present

## 2018-06-18 DIAGNOSIS — R29703 NIHSS score 3: Secondary | ICD-10-CM | POA: Diagnosis present

## 2018-06-18 DIAGNOSIS — G459 Transient cerebral ischemic attack, unspecified: Principal | ICD-10-CM | POA: Diagnosis present

## 2018-06-18 DIAGNOSIS — N809 Endometriosis, unspecified: Secondary | ICD-10-CM | POA: Diagnosis present

## 2018-06-18 DIAGNOSIS — Z888 Allergy status to other drugs, medicaments and biological substances status: Secondary | ICD-10-CM | POA: Diagnosis not present

## 2018-06-18 DIAGNOSIS — Z800 Family history of malignant neoplasm of digestive organs: Secondary | ICD-10-CM

## 2018-06-18 DIAGNOSIS — F5101 Primary insomnia: Secondary | ICD-10-CM

## 2018-06-18 DIAGNOSIS — Z90710 Acquired absence of both cervix and uterus: Secondary | ICD-10-CM | POA: Diagnosis not present

## 2018-06-18 DIAGNOSIS — R531 Weakness: Secondary | ICD-10-CM | POA: Diagnosis present

## 2018-06-18 DIAGNOSIS — I639 Cerebral infarction, unspecified: Secondary | ICD-10-CM | POA: Diagnosis not present

## 2018-06-18 DIAGNOSIS — F320 Major depressive disorder, single episode, mild: Secondary | ICD-10-CM

## 2018-06-18 DIAGNOSIS — I6300 Cerebral infarction due to thrombosis of unspecified precerebral artery: Secondary | ICD-10-CM | POA: Diagnosis not present

## 2018-06-18 DIAGNOSIS — I9589 Other hypotension: Secondary | ICD-10-CM

## 2018-06-18 DIAGNOSIS — E861 Hypovolemia: Secondary | ICD-10-CM

## 2018-06-18 DIAGNOSIS — J0100 Acute maxillary sinusitis, unspecified: Secondary | ICD-10-CM

## 2018-06-18 LAB — COMPREHENSIVE METABOLIC PANEL
ALT: 20 U/L (ref 0–44)
AST: 20 U/L (ref 15–41)
Albumin: 4 g/dL (ref 3.5–5.0)
Alkaline Phosphatase: 59 U/L (ref 38–126)
Anion gap: 10 (ref 5–15)
BUN: 24 mg/dL — ABNORMAL HIGH (ref 6–20)
CO2: 25 mmol/L (ref 22–32)
Calcium: 9.5 mg/dL (ref 8.9–10.3)
Chloride: 102 mmol/L (ref 98–111)
Creatinine, Ser: 1.03 mg/dL — ABNORMAL HIGH (ref 0.44–1.00)
GFR calc Af Amer: >60 mL/min (ref >60)
GFR calc non Af Amer: 59 mL/min — ABNORMAL LOW (ref >60)
Glucose, Bld: 109 mg/dL — ABNORMAL HIGH (ref 70–99)
Potassium: 3.4 mmol/L — ABNORMAL LOW (ref 3.5–5.1)
Sodium: 137 mmol/L (ref 135–145)
Total Bilirubin: 0.6 mg/dL (ref 0.3–1.2)
Total Protein: 6.8 g/dL (ref 6.5–8.1)

## 2018-06-18 LAB — CBC
HCT: 34.1 % — ABNORMAL LOW (ref 36.0–46.0)
Hemoglobin: 10.7 g/dL — ABNORMAL LOW (ref 12.0–15.0)
MCH: 32.5 pg (ref 26.0–34.0)
MCHC: 31.4 g/dL (ref 30.0–36.0)
MCV: 103.6 fL — ABNORMAL HIGH (ref 78.0–100.0)
Platelets: 317 10*3/uL (ref 150–400)
RBC: 3.29 MIL/uL — ABNORMAL LOW (ref 3.87–5.11)
RDW: 12.5 % (ref 11.5–15.5)
WBC: 8.3 10*3/uL (ref 4.0–10.5)

## 2018-06-18 LAB — DIFFERENTIAL
Abs Immature Granulocytes: 0 10*3/uL (ref 0.0–0.1)
Basophils Absolute: 0.1 10*3/uL (ref 0.0–0.1)
Basophils Relative: 1 %
Eosinophils Absolute: 0.1 10*3/uL (ref 0.0–0.7)
Eosinophils Relative: 1 %
Immature Granulocytes: 1 %
Lymphocytes Relative: 15 %
Lymphs Abs: 1.2 10*3/uL (ref 0.7–4.0)
Monocytes Absolute: 0.7 10*3/uL (ref 0.1–1.0)
Monocytes Relative: 9 %
Neutro Abs: 6.2 10*3/uL (ref 1.7–7.7)
Neutrophils Relative %: 73 %

## 2018-06-18 LAB — PROTIME-INR
INR: 0.91
Prothrombin Time: 12.2 seconds (ref 11.4–15.2)

## 2018-06-18 LAB — I-STAT CHEM 8, ED
BUN: 28 mg/dL — ABNORMAL HIGH (ref 6–20)
Calcium, Ion: 1.14 mmol/L — ABNORMAL LOW (ref 1.15–1.40)
Chloride: 102 mmol/L (ref 98–111)
Creatinine, Ser: 1 mg/dL (ref 0.44–1.00)
Glucose, Bld: 105 mg/dL — ABNORMAL HIGH (ref 70–99)
HCT: 34 % — ABNORMAL LOW (ref 36.0–46.0)
Hemoglobin: 11.6 g/dL — ABNORMAL LOW (ref 12.0–15.0)
Potassium: 3.4 mmol/L — ABNORMAL LOW (ref 3.5–5.1)
Sodium: 136 mmol/L (ref 135–145)
TCO2: 25 mmol/L (ref 22–32)

## 2018-06-18 LAB — I-STAT BETA HCG BLOOD, ED (MC, WL, AP ONLY): I-stat hCG, quantitative: <5 m[IU]/mL (ref <5)

## 2018-06-18 LAB — CBG MONITORING, ED: Glucose-Capillary: 96 mg/dL (ref 70–99)

## 2018-06-18 LAB — I-STAT TROPONIN, ED: Troponin i, poc: 0 ng/mL (ref 0.00–0.08)

## 2018-06-18 LAB — APTT: aPTT: 27 seconds (ref 24–36)

## 2018-06-18 MED ORDER — STROKE: EARLY STAGES OF RECOVERY BOOK
Freq: Once | Status: AC
  Administered 2018-06-20
  Filled 2018-06-18: qty 1

## 2018-06-18 MED ORDER — SODIUM CHLORIDE 0.9 % IV SOLN
INTRAVENOUS | Status: DC
  Administered 2018-06-18 (×2): via INTRAVENOUS

## 2018-06-18 MED ORDER — ACETAMINOPHEN 160 MG/5ML PO SOLN: 650.0000 mg | ORAL | Status: DC | PRN

## 2018-06-18 MED ORDER — IOPAMIDOL (ISOVUE-370) INJECTION 76%
50.0000 mL | Freq: Once | INTRAVENOUS | Status: AC | PRN
  Administered 2018-06-18: 50 mL via INTRAVENOUS

## 2018-06-18 MED ORDER — SODIUM CHLORIDE 0.9 % IV SOLN: 50.0000 mL | Freq: Once | INTRAVENOUS | Status: DC

## 2018-06-18 MED ORDER — ACETAMINOPHEN 325 MG PO TABS
650.0000 mg | ORAL_TABLET | ORAL | Status: DC | PRN
  Filled 2018-06-18: qty 2

## 2018-06-18 MED ORDER — ALTEPLASE (STROKE) FULL DOSE INFUSION
0.9000 mg/kg | Freq: Once | INTRAVENOUS | Status: AC
  Administered 2018-06-18: 57.8 mg via INTRAVENOUS

## 2018-06-18 MED ORDER — ACETAMINOPHEN 650 MG RE SUPP: 650.0000 mg | RECTAL | Status: DC | PRN

## 2018-06-18 MED ORDER — ALTEPLASE (STROKE) FULL DOSE INFUSION: 0.9000 mg/kg | Freq: Once | INTRAVENOUS | Status: DC

## 2018-06-18 NOTE — ED Provider Notes (Signed)
Patient placed in Quick Look pathway, seen and evaluated   Chief Complaint:  Left facial numbness, cp, left neck  HPI:    Onset of pain in the chest, left neck pain, facial sensation tes, difficultry swallowing, all on the left. Feels like heart is racing and , difficulty swallowing, feels disoriented.   ROS: numbness (one)  Physical Exam:   Gen: No distress  Neuro: Awake and Alert  Skin: Warm    Focused Exam: Cranial nerves intact, facial numbness  Code stroke activated.   Initiation of care has begun. The patient has been counseled on the process, plan, and necessity for staying for the completion/evaluation, and the remainder of the medical screening examination    Margarita Mail, PA-C 06/18/18 1332    Pattricia Boss, MD 06/18/18 1349

## 2018-06-18 NOTE — Code Documentation (Signed)
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 She reports that she ate lunch, and after luinch, she noted to have sensory decreased on the left side with some left arm and leg weakness. Pt was driven to the ED by friend. Triage activated a Code Stroke with LSN 1230. Stroke Team met patient in CT. Initial NIHSS 4 with left arm and left leg drift, left facial droop, and left sensory decreased. CT Head negative. Delay of tPA due to need to obtain CBG. Once obtain,  tPA started at 1400. CTA did not show LVO. Patient reports hx of HTN that has been uncontrolled by her BP medication. Patient also complains of left neck pain and central chest pressure that happened simultaneous with the neuro symptoms. Chest pain has decreased in CT. Pt alert and oriented x4. Pt placed on the monitor. Handoff given to Lennette Bihari, Therapist, sports.

## 2018-06-18 NOTE — H&P (Signed)
Neurology H&P  CC: Left-sided weakness  History is obtained from: Patient  HPI: [name] is a 57 y.o. female with a history of hyperlipidemia, hypertension who presents with left-sided weakness that started abruptly this morning.  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  It has been persistent, possibly slightly worsening since onset.  Of note, she had some mild  LKW: 12:30 PM tpa given?:  Yes ICH Score: 0 Modified Rankin Scale: 0-Completely asymptomatic and back to baseline post- stroke  ROS: A 14 point ROS was performed and is negative except as noted in the HPI.   Past Medical History:  Diagnosis Date  . Adenomatous polyps   . Anemia   . Blood transfusion without reported diagnosis 01/1999   following hysterectomy  . Bulging discs   . Endometriosis   . Hyperlipidemia   . Hypertension   . PVC (premature ventricular contraction)   . Urinary incontinence      Family History  Problem Relation Age of Onset  . Hypertension Mother   . Hypertension Father   . Cancer Father   . Hyperlipidemia Sister   . Hypertension Sister   . Hyperlipidemia Brother   . Hypertension Brother   . Hypertension Brother   . Hypertension Brother   . Cancer Maternal Uncle 72       colon ca  . Breast cancer Maternal Grandmother   . Cancer Maternal Grandmother 61       colon ca     Social History:  reports that she has quit smoking. Her smoking use included cigarettes. She has never used smokeless tobacco. She reports that she drinks about 3.6 - 4.8 oz of alcohol per week. She reports that she has current or past drug history. Drug: Marijuana.   Exam: Current vital signs: BP (!) 166/87 (BP Location: Left Arm)   Pulse (!) 113   Temp 98.2 F (36.8 C) (Oral)   Resp 18   SpO2 98%  Vital signs in last 24 hours: Temp:  [98.2 F (36.8 C)] 98.2 F (36.8 C) (06/26  1325) Pulse Rate:  [113] 113 (06/26 1325) Resp:  [18] 18 (06/26 1325) BP: (166)/(87) 166/87 (06/26 1325) SpO2:  [98 %] 98 % (06/26 1325)  Physical Exam  Constitutional: Appears well-developed and well-nourished.  Psych: Affect appropriate to situation Eyes: No scleral injection HENT: No OP obstrucion Head: Normocephalic.  Cardiovascular: Normal rate and regular rhythm.  Respiratory: Effort normal and breath sounds normal to anterior ascultation GI: Soft.  No distension. There is no tenderness.  Skin: WDI  Neuro: Mental Status: Patient is awake, alert, oriented to person, place, month, year, and situation. Patient is able to give a clear and coherent history. No signs of aphasia or neglect Cranial Nerves: II: Visual Fields are full. Pupils are equal, round, and reactive to light.   III,IV, VI: EOMI without ptosis or diploplia.  V: Facial sensation is decreased on the left side VII: Facial movement is weak on the left VIII: hearing is intact to voice X: Uvula elevates symmetrically XI: Shoulder shrug is symmetric. XII: tongue is midline without atrophy or fasciculations.  Motor: Tone is normal. Bulk is normal. 5/5 strength was present on the right side, she has 4/5 strength of the left arm and leg.  She has drift in the left arm and leg and positive orbital sign. Sensory: Sensation is diminished throughout  the left side Cerebellar: FNF intact on the right, consistent with weakness on the left   I have reviewed labs in epic and the results pertinent to this consultation are: Creatinine 1.0 Mild hypokalemia with potassium 3.4 Mild anemia hemoglobin of 11.6  I have reviewed the images obtained: CT head/CTA-unremarkable  Primary Diagnosis:  Acute ischemic stroke due to occlusion of unspecified artery   Secondary Diagnosis: Essential (primary) hypertension Hyperlipidemia  Impression: 57 year old female with acute left face arm and leg weakness who presents within the  timeframe for IV TPA.  Her symptoms are on the milder side, I do think that her left leg weakness could be disabling and therefore I  discussed IV TPA with the patient including risks and benefits she elected to proceed with the medication. I suspect small vessel disease.   Recommendations: 1. HgbA1c, fasting lipid panel 2. MRI, MRA  of the brain without contrast 3. Frequent neuro checks 4. Echocardiogram 5. Carotid dopplers 6. Prophylactic therapy-none for 24 hours.  7. Risk factor modification 8. Telemetry monitoring 9. PT consult, OT consult, Speech consult 10. please page stroke NP  Or  PA  Or MD  from 8am -4 pm as this patient will be followed by the stroke team at this point.   You can look them up on [email or web address]      This patient is critically ill and at significant risk of neurological worsening, death and care requires constant monitoring of vital signs, hemodynamics,respiratory and cardiac monitoring, neurological assessment, discussion with family, other specialists and medical decision making of high complexity. I spent 50 minutes of neurocritical care time  in the care of  this patient.  Roland Rack, MD [facility] [phone]  If 7pm- 7am, please page neurology on call as listed in [place]. 06/18/2018  2:20 PM

## 2018-06-18 NOTE — ED Notes (Signed)
Pt continues to have decreased sensation to the L face, L arm, L leg; slight drift to L leg but per patient improved; L grip strength decreased compared to R; per pt sensation has improved but still feels different

## 2018-06-18 NOTE — ED Provider Notes (Addendum)
[facility] EMERGENCY DEPARTMENT Provider Note   CSN: [id number] Arrival date & time: 06/18/18  1316     History   Chief Complaint Chief Complaint  Patient presents with  . Code Stroke    HPI [name] is a 57 y.o. female.  HPI  57 year old female history of hypertension and hyperlipidemia presents today with last known normal at 25 with left facial droop, left arm numbness, and left leg weakness.  She states her blood pressure has been running high for the past 2 weeks.  She has had some increase in her antihypertensives.  She is not having a headache.  Some discomfort of her left neck. Also endorses some left sided chest pain that also began about one hour pte which resolved about 0130.  She reports some associated symtoms of dyspnea and lightheadedness.  She reports some similar chest pain within the last two weeks one time.  Past Medical History:  Diagnosis Date  . Adenomatous polyps   . Anemia   . Blood transfusion without reported diagnosis 01/1999   following hysterectomy  . Bulging discs   . Endometriosis   . Hyperlipidemia   . Hypertension   . PVC (premature ventricular contraction)   . Urinary incontinence     Patient Active Problem List   Diagnosis Date Noted  . Adjustment disorder with depressed mood 07/01/2017  . Lumbar stenosis with neurogenic claudication 02/09/2017  . Lumbar stenosis 02/09/2017  . Mixed incontinence 08/05/2014  . HTN (hypertension) 02/07/2012  . Depression 02/07/2012  . Dyslipidemia 02/07/2012  . Menopausal and perimenopausal disorder 02/07/2012    Past Surgical History:  Procedure Laterality Date  . ABDOMINAL HYSTERECTOMY  2001   TAH/RSO  . AUGMENTATION MAMMAPLASTY Bilateral 2013   saline. pre pectoral  . BLADDER SUSPENSION  2003   LSO and rectocele repair at [facility]  . BREAST SURGERY     breast augmentation--saline implants  . HERNIA REPAIR    . LUMBAR FUSION  2010   Dr. Carloyn Manner  . REDUCTION MAMMAPLASTY  Bilateral   . SPINE SURGERY       OB History    Gravida  5   Para  3   Term  3   Preterm      AB  2   Living  3     SAB  2   TAB      Ectopic      Multiple      Live Births               Home Medications    Prior to Admission medications   Medication Sig Start Date End Date Taking? Authorizing Provider  albuterol (PROVENTIL HFA;VENTOLIN HFA) 108 (90 BASE) MCG/ACT inhaler Inhale 2 puffs into the lungs every 4 (four) hours as needed for wheezing or shortness of breath (cough, shortness of breath or wheezing.). Patient not taking: Reported on 06/30/2017 06/06/15   Jaynee Eagles, PA-C  buPROPion (WELLBUTRIN XL) 300 MG 24 hr tablet Take 300 mg by mouth daily.    [provider]  estradiol (ESTRACE) 1 MG tablet TAKE 1 TABLET (1 MG TOTAL) BY MOUTH DAILY. 09/04/17   Salvadore Dom, MD  gabapentin (NEURONTIN) 300 MG capsule Take 1 capsule (300 mg total) by mouth 3 (three) times daily. 07/01/17   Patrecia Pour, NP  losartan (COZAAR) 100 MG tablet Take 100 mg by mouth daily.     [provider]  metoprolol succinate (TOPROL-XL) 25 MG 24 hr tablet  Take 25 mg by mouth daily.  10/09/16   [provider]  rosuvastatin (CRESTOR) 10 MG tablet Take 10 mg by mouth daily.    [provider]  triamterene-hydrochlorothiazide (MAXZIDE-25) 37.5-25 MG tablet Take 1 tablet by mouth daily.    [provider]  zolpidem (AMBIEN) 10 MG tablet Take 10 mg by mouth at bedtime as needed for sleep.    [provider]    Family History Family History  Problem Relation Age of Onset  . Hypertension Mother   . Hypertension Father   . Cancer Father   . Hyperlipidemia Sister   . Hypertension Sister   . Hyperlipidemia Brother   . Hypertension Brother   . Hypertension Brother   . Hypertension Brother   . Cancer Maternal Uncle 72       colon ca  . Breast cancer Maternal Grandmother   . Cancer Maternal Grandmother 65       colon ca     Social History Social History   Tobacco Use  . Smoking status: Former Smoker    Types: Cigarettes  . Smokeless tobacco: Never Used  Substance Use Topics  . Alcohol use: Yes    Alcohol/week: 3.6 - 4.8 oz    Types: 6 - 8 Standard drinks or equivalent per week    Comment: 1 glass wine per night  . Drug use: Yes    Types: Marijuana     Allergies   Ceftin [cefuroxime]; Penicillins; and Vancomycin   Review of Systems Review of Systems  All other systems reviewed and are negative.    Physical Exam Updated Vital Signs BP (!) 166/87 (BP Location: Left Arm)   Pulse (!) 113   Temp 98.2 F (36.8 C) (Oral)   Resp 18   SpO2 98%   Physical Exam  Constitutional: She is oriented to person, place, and time. She appears well-developed and well-nourished. No distress.  HENT:  Head: Normocephalic and atraumatic.  Right Ear: External ear normal.  Left Ear: External ear normal.  Nose: Nose normal.  Eyes: Pupils are equal, round, and reactive to light. Conjunctivae and EOM are normal.  Neck: Normal range of motion. Neck supple.  Cardiovascular: Tachycardia present.  Pulmonary/Chest: Effort normal.  Abdominal: Soft. Bowel sounds are normal.  Musculoskeletal: Normal range of motion.  Neurological: She is alert and oriented to person, place, and time. A cranial nerve deficit and sensory deficit is present. She exhibits abnormal muscle tone. Coordination abnormal.  Left palmar drift Left leg with decreased strength but able to lift against gravity momentarily. Some decreased sensation left side   Skin: Skin is warm and dry.  Psychiatric: She has a normal mood and affect. Her behavior is normal. Thought content normal.  Nursing note and vitals reviewed.    ED Treatments / Results  Labs (all labs ordered are listed, but only abnormal results are displayed) Labs Reviewed  I-STAT CHEM 8, ED - Abnormal; Notable for the following components:      Result Value   Potassium 3.4 (*)     BUN 28 (*)    Glucose, Bld 105 (*)    Calcium, Ion 1.14 (*)    Hemoglobin 11.6 (*)    HCT 34.0 (*)    All other components within normal limits  PROTIME-INR  APTT  CBC  DIFFERENTIAL  COMPREHENSIVE METABOLIC PANEL  CBG MONITORING, ED  I-STAT TROPONIN, ED  I-STAT BETA HCG BLOOD, ED ([facility], [facility], [facility] ONLY)    EKG EKG Interpretation  Date/Time:  Wednesday June 18 2018 13:29:30 EDT Ventricular Rate:  111 PR Interval:  170 QRS Duration: 82 QT Interval:  336 QTC Calculation: 456 R Axis:   76 Text Interpretation:  Sinus tachycardia Right atrial enlargement Anterior infarct , age undetermined Abnormal ECG diffuse nsst changes Confirmed by Pattricia Boss [phone]) on 06/18/2018 2:09:37 PM   Radiology Ct Head Code Stroke Wo Contrast  Result Date: 06/18/2018 CLINICAL DATA:  Code stroke. Difficulty swallowing, LEFT-sided facial droop. Last seen normal 1 hour and 15 minutes earlier. EXAM: CT HEAD WITHOUT CONTRAST TECHNIQUE: Contiguous axial images were obtained from the base of the skull through the vertex without intravenous contrast. COMPARISON:  MR head 09/06/2017. FINDINGS: Brain: No evidence of acute infarction, hemorrhage, hydrocephalus, extra-axial collection or mass lesion/mass effect. Normal for age cerebral volume. No white matter disease. Vascular: Calcification of the cavernous internal carotid arteries consistent with cerebrovascular atherosclerotic disease. No signs of intracranial large vessel occlusion. Skull: Calvarium intact. Sinuses/Orbits: No sinus disease.  Negative orbits. Other: None. ASPECTS [facility] Stroke Program Early CT Score) - Ganglionic level infarction (caudate, lentiform nuclei, internal capsule, insula, M1-M3 cortex): 7 - Supraganglionic infarction (M4-M6 cortex): 3 Total score (0-10 with 10 being normal): 10 IMPRESSION: 1. Cavernous carotid atherosclerosis. No signs of large vessel occlusion. No acute or focal intracranial abnormality. 2. ASPECTS is 10. These results  were communicated to Dr. Leonel Ramsay at 1:50 pmon 6/26/2019by text page via the [facility] messaging system. Electronically Signed   By: Staci Righter M.D.   On: 06/18/2018 13:51    Procedures Procedures (including critical care time)  Medications Ordered in ED Medications  iopamidol (ISOVUE-370) 76 % injection 50 mL (50 mLs Intravenous Contrast Given 06/18/18 1403)     Initial Impression / Assessment and Plan / ED Course  I have reviewed the triage vital signs and the nursing notes.  Pertinent labs & imaging results that were available during my care of the patient were reviewed by me and considered in my medical decision making (see chart for details).    57 y.o. Female with last known normal about ond hour pte, miild left facial droop, left arm drift, difficulty lifting left leg off bed- Dr.Kirkpatrick at bedside and initiating tpa.  NO chest pain currently Will check cxr Plan one liter ns Recheck ekg after, Dr. Leonel Ramsay plans trend of troponins  CRITICAL CARE Performed by: Pattricia Boss Total critical care time: 45 minutes Critical care time was exclusive of separately billable procedures and treating other patients. Critical care was necessary to treat or prevent imminent or life-threatening deterioration. Critical care was time spent personally by me on the following activities: development of treatment plan with patient and/or surrogate as well as nursing, discussions with consultants, evaluation of patient's response to treatment, examination of patient, obtaining history from patient or surrogate, ordering and performing treatments and interventions, ordering and review of laboratory studies, ordering and review of radiographic studies, pulse oximetry and re-evaluation of patient's condition.   Final Clinical Impressions(s) / ED Diagnoses   Final diagnoses:  Cerebrovascular accident (CVA), unspecified mechanism ([place])  Chest pain, unspecified type  Abnormal EKG    ED  Discharge Orders    None       Pattricia Boss, MD 06/18/18 1512    Pattricia Boss, MD 06/18/18 1515

## 2018-06-18 NOTE — ED Notes (Signed)
NIH improved to 1- only sensory deficits currently; no drift noted in the L leg

## 2018-06-18 NOTE — ED Notes (Signed)
NIH now a 3, pt no longer has drift in left arm. Pt still has left sided facial droop and drift in the left leg as well as sensory loss on the whole left side. Axox4. Pt has pure wick in place and coworkers at bedside. Still has slight headache. Sx have improved and pt states that she feels better since coming here.

## 2018-06-18 NOTE — ED Triage Notes (Signed)
Patient arrived from surgical center after developing left sided facial numbness, left arm drift and just not feeling right. Patient also complains of epigastric pain. Symptoms started at 1238 and alert and oriented. Code stroke activated at triage

## 2018-06-18 NOTE — ED Notes (Signed)
Attempted report x 1; name and call back number provided 

## 2018-06-18 NOTE — ED Notes (Signed)
Pt continues to voice "my face just feels weird, I cant explain it" NIH remains 1 for L side face sensory deficit

## 2018-06-18 NOTE — ED Notes (Signed)
NIH improved to 2, facial palsy resolved, able to puff out bilat cheeks equally

## 2018-06-19 ENCOUNTER — Inpatient Hospital Stay (HOSPITAL_COMMUNITY): Payer: BLUE CROSS/BLUE SHIELD

## 2018-06-19 ENCOUNTER — Other Ambulatory Visit: Payer: Self-pay

## 2018-06-19 DIAGNOSIS — I6300 Cerebral infarction due to thrombosis of unspecified precerebral artery: Secondary | ICD-10-CM

## 2018-06-19 LAB — ECHOCARDIOGRAM COMPLETE
Height: 65 in
Weight: 2317.48 oz

## 2018-06-19 LAB — HEMOGLOBIN A1C
Hgb A1c MFr Bld: 5 % (ref 4.8–5.6)
Mean Plasma Glucose: 96.8 mg/dL

## 2018-06-19 LAB — LIPID PANEL
Cholesterol: 221 mg/dL — ABNORMAL HIGH (ref 0–200)
HDL: 107 mg/dL (ref >40)
LDL Cholesterol: 82 mg/dL (ref 0–99)
Total CHOL/HDL Ratio: 2.1 RATIO
Triglycerides: 161 mg/dL — ABNORMAL HIGH (ref <150)
VLDL: 32 mg/dL (ref 0–40)

## 2018-06-19 MED ORDER — BUPROPION HCL ER (XL) 150 MG PO TB24
300.0000 mg | ORAL_TABLET | Freq: Every day | ORAL | Status: DC
  Administered 2018-06-20: 300 mg via ORAL
  Filled 2018-06-19 (×2): qty 2

## 2018-06-19 MED ORDER — ZOLPIDEM TARTRATE 5 MG PO TABS: 5.0000 mg | ORAL_TABLET | Freq: Every evening | ORAL | Status: DC | PRN

## 2018-06-19 MED ORDER — ALPRAZOLAM 0.5 MG PO TABS
0.5000 mg | ORAL_TABLET | Freq: Three times a day (TID) | ORAL | Status: DC | PRN
  Administered 2018-06-19 – 2018-06-20 (×2): 0.5 mg via ORAL
  Filled 2018-06-19 (×2): qty 1

## 2018-06-19 MED ORDER — ALBUTEROL SULFATE (2.5 MG/3ML) 0.083% IN NEBU: 3.0000 mL | INHALATION_SOLUTION | RESPIRATORY_TRACT | Status: DC | PRN

## 2018-06-19 MED ORDER — ASPIRIN EC 81 MG PO TBEC
81.0000 mg | DELAYED_RELEASE_TABLET | Freq: Every day | ORAL | Status: DC
  Administered 2018-06-19 – 2018-06-20 (×2): 81 mg via ORAL
  Filled 2018-06-19 (×2): qty 1

## 2018-06-19 MED ORDER — ROSUVASTATIN CALCIUM 20 MG PO TABS
20.0000 mg | ORAL_TABLET | Freq: Every day | ORAL | Status: DC
  Administered 2018-06-19: 20 mg via ORAL
  Filled 2018-06-19: qty 1

## 2018-06-19 NOTE — Progress Notes (Signed)
Patient unable for post TPA neurological and vital sign assessment. Off unit for echocardiogram. Wendee Copp

## 2018-06-19 NOTE — Progress Notes (Signed)
STROKE TEAM PROGRESS NOTE   HISTORY OF PRESENT ILLNESS (per Dr Leonel Ramsay) [name] is a 57 y.o. female with a history of hyperlipidemia, hypertension who presents with left-sided weakness that started abruptly this morning.  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  It has been persistent, possibly slightly worsening since onset. Of note, she had some mild .......Marland Kitchen  LKW: 12:30 PM tpa given?:  Yes ICH Score: 0 Modified Rankin Scale: 0-Completely asymptomatic and back to baseline post- stroke    SUBJECTIVE (INTERVAL HISTORY) No family members were present.  The patient works as a Marine scientist.  She was able to give a thorough history.  She had multiple questions all of which were answered.pressure has been adequately controlled. Patient states she has made a  near complete neurological recovery. She still has some tightness on the left face.    OBJECTIVE Temp:  [97.9 F (36.6 C)-98.6 F (37 C)] 98.3 F (36.8 C) (06/27 1400) Pulse Rate:  [67-106] 70 (06/27 1400) Cardiac Rhythm: Normal sinus rhythm (06/27 0758) Resp:  [9-24] 18 (06/27 1400) BP: (107-157)/(64-97) 128/74 (06/27 1400) SpO2:  [92 %-100 %] 98 % (06/27 1400) Weight:  [144 lb 13.5 oz (65.7 kg)] 144 lb 13.5 oz (65.7 kg) (06/26 2000)  CBC:  Recent Labs  Lab 06/18/18 1353 06/18/18 1359  WBC 8.3  --   NEUTROABS 6.2  --   HGB 10.7* 11.6*  HCT 34.1* 34.0*  MCV 103.6*  --   PLT 317  --     Basic Metabolic Panel:  Recent Labs  Lab 06/18/18 1353 06/18/18 1359  NA 137 136  K 3.4* 3.4*  CL 102 102  CO2 25  --   GLUCOSE 109* 105*  BUN 24* 28*  CREATININE 1.03* 1.00  CALCIUM 9.5  --     Lipid Panel:     Component Value Date/Time   CHOL 221 (H) 06/19/2018 1217   TRIG 161 (H) 06/19/2018 1217   HDL 107 06/19/2018 1217   CHOLHDL 2.1 06/19/2018 1217   VLDL 32 06/19/2018 1217   LDLCALC 82  06/19/2018 1217   HgbA1c:  Lab Results  Component Value Date   HGBA1C 5.0 06/19/2018   Urine Drug Screen:     Component Value Date/Time   LABOPIA NONE DETECTED 06/30/2017 1549   COCAINSCRNUR NONE DETECTED 06/30/2017 1549   LABBENZ NONE DETECTED 06/30/2017 1549   AMPHETMU NONE DETECTED 06/30/2017 1549   THCU NONE DETECTED 06/30/2017 1549   LABBARB NONE DETECTED 06/30/2017 1549    Alcohol Level     Component Value Date/Time   ETH 212 (H) 06/30/2017 1354    IMAGING Ct Angio Neck W Or Wo Contrast Ct Angio Head W Or Wo Contrast 06/18/2018 IMPRESSION:  Negative CT angiography. Minimal atherosclerotic change at the carotid bifurcations but no stenosis or irregularity. The vessels are tortuous as might be seen with a history of hypertension.  CT Head Wo Contrast 06/19/2018 IMPRESSION: Negative CT head  Mr Brain Wo Contrast 06/19/2018 IMPRESSION:  Stable negative MRI of the brain.    Dg Chest Portable 1 View 06/18/2018 IMPRESSION:  No active cardiopulmonary disease.    Ct Head Code Stroke Wo Contrast 06/18/2018 IMPR ESSION:  1. Cavernous carotid atherosclerosis. No signs of large vessel occlusion. No acute or focal intracranial abnormality.  2. ASPECTS is 10.     Transthoracic Echocardiogram - pending 00/00/00  Bilateral Carotid Dopplers - CTA neck      PHYSICAL EXAM Vitals:   06/19/18 1058 06/19/18 1155 06/19/18 1258 06/19/18 1400  BP: 122/80 118/88 124/80 128/74  Pulse: 72 72 68 70  Resp: 18 18 17 18   Temp: 98.4 F (36.9 C) 98.4 F (36.9 C) 98 F (36.7 C) 98.3 F (36.8 C)  TempSrc: Oral Oral Oral Oral  SpO2: 98% 98% 100% 98%  Weight:      Height:       Pleasant middle-age Caucasian lady currently not in distress. . Afebrile. Head is nontraumatic. Neck is supple without bruit.    Cardiac exam no murmur or gallop. Lungs are clear to auscultation. Distal pulses are well felt. Neurological Exam ;  Awake  Alert oriented x 3. Normal speech and  language.eye movements full without nystagmus.fundi were not visualized. Vision acuity and fields appear normal. Hearing is normal. Palatal movements are normal. Face symmetric.subjective (numbness but no objective sensory loss Tongue midline. Normal strength, tone, reflexes and coordination. Normal sensation. Gait deferred.              ASSESSMENT/PLAN [name] is a 57 y.o. female with history of PVCs, hypertension, hyperlipidemia, endometriosis, and anemia presenting with left-sided weakness. tpa given Wednesday 06/18/2018 at 1400.  Suspected stroke:  No infarct on imaging  Resultant  Left facial numbness  CT head - No acute or focal intracranial abnormality.   MRI head - Stable negative MRI of the brain.   MRA head - not performed  CTA H&N - Negative CT angiography.  Carotid Doppler - CTA neck  2D Echo - pending  LDL - 82  HgbA1c - 5.0  VTE prophylaxis - SCDs Diet Order           Diet heart healthy/carb modified Room service appropriate? Yes; Fluid consistency: Thin  Diet effective now          No antithrombotic prior to admission, now on No antithrombotic start ASA 81 mg daily  Patient counseled to be compliant with her antithrombotic medications  Ongoing aggressive stroke risk factor management  Therapy recommendations:  pending  Disposition:  Pending  Hypertension   Stable .   Permissive hypertension (OK if < 220/120) but gradually normalize in 5-7 days .  Long-term BP goal normotensive  Hyperlipidemia  Lipid lowering medication PTA:  Crestor 10 mg daily  LDL 82 , goal < 70  Current lipid lowering medication: Increase Crestor to 20 mg daily  Continue statin at discharge     Other Stroke Risk Factors   Former cigarette smoker - quit  ETOH use, advised to drink no more than 1 alcoholic beverage per day.   Other Active Problems  Estradiol PTA -the patient will speak to her gynecologist about stopping this  medication.  Mild hypokalemia - supplement    PLAN   Follow-up imaging was negative for infarct or bleed.  Start aspirin therapy.  Await therapy evaluations and 2D echo  Probable discharge in a.m.  Start to resume home medications.  Hospital day # 1  Mikey Bussing PA-C [facility] Pager [phone] 06/19/2018, 3:39 PM I have personally examined this patient, reviewed notes, independently viewed imaging studies, participated in medical decision making and plan of care.ROS completed by me personally and pertinent positives fully documented  I have made any additions or clarifications directly to the above note. Agree with note above. She presented with subjective left-sided weakness and facial droop and heaviness and received IV tPA with  near complete recovery. MRI shows no definite evidence of stroke. She remains at risk for neurological worsening recommend close blood pressure monitoring and neurological follow-up as per post TPA protocol. Check echocardiogram, lipid profile and hemoglobin A1c. Mobilize out of bed therapy consults. Likely discharge home tomorrow. No family available at the bedside for discussion.This patient is critically ill and at significant risk of neurological worsening, death and care requires constant monitoring of vital signs, hemodynamics,respiratory and cardiac monitoring, extensive review of multiple databases, frequent neurological assessment, discussion with family, other specialists and medical decision making of high complexity.I have made any additions or clarifications directly to the above note.This critical care time does not reflect procedure time, or teaching time or supervisory time of PA/NP/Med Resident etc but could involve care discussion time.  I spent 30 minutes of neurocritical care time  in the care of  this patient.      Antony Contras, MD Medical Director [facility] Pager: [phone] 06/19/2018 3:54  PM   To contact Stroke Continuity provider, please refer to [email or web address] After hours, contact General Neurology

## 2018-06-19 NOTE — Progress Notes (Signed)
OT Cancellation Note  Patient Details Name: [name] MRN: [id number] DOB: [birth year]   Cancelled Treatment:    Reason Eval/Treat Not Completed: Active bedrest order, and Pt at MRI.  OT will continue to follow for Pt availability and eval once activity orders updated.   [place] 06/19/2018, 8:05 AM  Hulda Humphrey OTR/L [phone]

## 2018-06-19 NOTE — Care Management Note (Signed)
Case Management Note  Patient Details  Name: [name] MRN: [id number] Date of Birth: 1961/09/05  Subjective/Objective:   Presents with L sided weakness /Acute ischemic stroke due to occlusion of unspecified artery , hx of HTN, hyperlipidemia. 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        [name] ([phone]     PCP: Derinda Late  Action/Plan:  CVA workup in process....PT/OT evaluations pending..... NCM following for disposition needs.  Expected Discharge Date:                  Expected Discharge Plan:     In-House Referral:     Discharge planning Services  CM Consult  Post Acute Care Choice:    Choice offered to:     DME Arranged:    DME Agency:     HH Arranged:    HH Agency:     Status of Service:  In process, will continue to follow  If discussed at Long Length of Stay Meetings, dates discussed:    Additional Comments:  Sharin Mons, RN 06/19/2018, 11:18 AM

## 2018-06-19 NOTE — Progress Notes (Signed)
SLP Cancellation Note  Patient Details Name: [name] MRN: [id number] DOB: 1961-11-14   Cancelled treatment:       Reason Eval/Treat Not Completed: SLP screened, no needs identified, will sign off. MRI negative. Pt reports symptoms have resolved except for slight numbness on L side. No signs of aphasia or dysarthria, thought content appropriate. Will sign off, reconsult if needs arise.    Kern Reap, [place], CCC-SLP 06/19/2018, 12:59 PM  [phone]

## 2018-06-19 NOTE — Progress Notes (Signed)
Occupational Therapy Evaluation and Discharge Patient Details Name: [name] MRN: [id number] DOB: 11/25/1961 Today's Date: 06/19/2018    History of Present Illness Patient is a 57 y/o female presenting with sudden onset of L sided weakness, numbness. tPA administered. CT head/CTA-unremarkable. Admitted with Acute ischemic stroke due to occlusion of unspecified artery. PMH significant for hyperlipidemia, hypertension.   Clinical Impression   PTA Pt independent in ADL and mobility. Pt is currently supervision in hospital setting for all ADL and transfers for safety - but able to complete independently. Was standing at sink performing standing grooming tasks when she felt unwell with headache. BP noted to be 176/100. Returned to supine and RN notified - and addressing situation. AT this time I do not anticipate that she will have any OT follow up needs, and will sign off. If situation changes please feel free to re-order OT. Thank you for the opportunity to serve this patient.     Follow Up Recommendations  No OT follow up    Equipment Recommendations  None recommended by OT    Recommendations for Other Services       Precautions / Restrictions Precautions Precautions: Fall Restrictions Weight Bearing Restrictions: No      Mobility Bed Mobility Overal bed mobility: Modified Independent                Transfers Overall transfer level: Needs assistance Equipment used: None Transfers: Sit to/from Stand Sit to Stand: Supervision         General transfer comment: for general safety    Balance Overall balance assessment: Needs assistance Sitting-balance support: No upper extremity supported;Feet supported Sitting balance-Leahy Scale: Good     Standing balance support: No upper extremity supported;During functional activity Standing balance-Leahy Scale: Fair                   Standardized Balance Assessment Standardized Balance Assessment : Dynamic  Gait Index   Dynamic Gait Index Level Surface: Normal Change in Gait Speed: Mild Impairment Gait with Horizontal Head Turns: Mild Impairment Gait with Vertical Head Turns: Normal Gait and Pivot Turn: Mild Impairment Step Over Obstacle: Mild Impairment Step Around Obstacles: Mild Impairment Steps: Mild Impairment Total Score: 18     ADL either performed or assessed with clinical judgement   ADL Overall ADL's : Independent                                       General ADL Comments: Pt able to perform toilet transfer and sink level grooming, during standing at sink she felt a change in wellness, and returned to supine. BP taken and was 176/100. RN notified     Vision Patient Visual Report: No change from baseline       Perception     Praxis      Pertinent Vitals/Pain Pain Assessment: 0-10 Pain Score: 4  Pain Location: headache Pain Descriptors / Indicators: Headache Pain Intervention(s): Limited activity within patient's tolerance;Monitored during session;Repositioned     Hand Dominance Right   Extremity/Trunk Assessment Upper Extremity Assessment Upper Extremity Assessment: Overall WFL for tasks assessed   Lower Extremity Assessment Lower Extremity Assessment: Defer to PT evaluation   Cervical / Trunk Assessment Cervical / Trunk Assessment: Normal   Communication Communication Communication: No difficulties   Cognition Arousal/Alertness: Awake/alert Behavior During Therapy: WFL for tasks assessed/performed Overall Cognitive Status: Within Functional Limits for tasks assessed  General Comments  patient reports she feels nearly to abseline; feels a little "off" with balance    Exercises     Shoulder Instructions      Home Living Family/patient expects to be discharged to:: Private residence Living Arrangements: Alone   Type of Home: House Home Access: Stairs to enter [employer] of Steps: 7 Entrance Stairs-Rails: Left Home Layout: Two level;Bed/bath upstairs Alternate Level Stairs-Number of Steps: 14   Bathroom Shower/Tub: Tub/shower unit;Walk-in shower   Bathroom Toilet: Standard     Home Equipment: None          Prior Functioning/Environment Level of Independence: Independent        Comments: exercises 3x/week; likes hot yoga        OT Problem List:        OT Treatment/Interventions:      OT Goals(Current goals can be found in the care plan section) Acute Rehab OT Goals Patient Stated Goal: get BP under control OT Goal Formulation: With patient Time For Goal Achievement: 07/03/18 Potential to Achieve Goals: Good  OT Frequency:     Barriers to D/C:            Co-evaluation              AM-PAC PT "6 Clicks" Daily Activity     Outcome Measure Help from another person eating meals?: None Help from another person taking care of personal grooming?: None Help from another person toileting, which includes using toliet, bedpan, or urinal?: None Help from another person bathing (including washing, rinsing, drying)?: None Help from another person to put on and taking off regular upper body clothing?: None Help from another person to put on and taking off regular lower body clothing?: None 6 Click Score: 24   End of Session Nurse Communication: Mobility status;Other (comment)(BP)  Activity Tolerance: Treatment limited secondary to medical complications (Comment)(high BP) Patient left: in bed;with call bell/phone within reach                   Time: 1727-1747 OT Time Calculation (min): 20 min Charges:  OT General Charges $OT Visit: 1 Visit OT Evaluation $OT Eval Moderate Complexity: 1 Mod G-Codes:     Hulda Humphrey OTR/L [place] 06/19/2018, 5:56 PM

## 2018-06-19 NOTE — Progress Notes (Signed)
UTA 0100 VS or complete Q1HR Neuro Check due to patient being at MRI.

## 2018-06-19 NOTE — Evaluation (Signed)
Physical Therapy Evaluation Patient Details Name: [name] MRN: [id number] DOB: June 04, 1961 Today's Date: 06/19/2018   History of Present Illness  Patient is a 57 y/o female presenting with sudden onset of L sided weakness, numbness. tPA administered. CT head/CTA-unremarkable. Admitted with Acute ischemic stroke due to occlusion of unspecified artery. PMH significant for hyperlipidemia, hypertension.  Clinical Impression  [name] is a very pleasant 57 y/o admitted with the above listed diagnosis. Prior to admission patient reports she lived alone, worked full time as a Marine scientist at [employer], and was independent with all aspects of mobility. Patient today only requiring supervision to Min guard for general safety. Slight balance deficits noted with gait with head turns and turning as well as hesitancy with stair navigation. PT recommending Outpatient PT at discharge to ensure full resolution of balance deficits. PT to continue to follow acutely.     Follow Up Recommendations Outpatient PT    Equipment Recommendations  None recommended by PT    Recommendations for Other Services       Precautions / Restrictions Precautions Precautions: Fall Restrictions Weight Bearing Restrictions: No      Mobility  Bed Mobility Overal bed mobility: Modified Independent                Transfers Overall transfer level: Needs assistance Equipment used: None Transfers: Sit to/from Stand Sit to Stand: Supervision         General transfer comment: for general safety  Ambulation/Gait Ambulation/Gait assistance: Min guard Gait Distance (Feet): 160 Feet Assistive device: None Gait Pattern/deviations: Step-through pattern;Decreased stride length;Drifts right/left;Narrow base of support Gait velocity: decreased   General Gait Details: slight imbalance with gait; min guard for general safety, no physical assist needed for steadying  Stairs Stairs: Yes Stairs assistance:  Min guard Stair Management: Two rails;Alternating pattern;Forwards Number of Stairs: 2    Wheelchair Mobility    Modified Rankin (Stroke Patients Only) Modified Rankin (Stroke Patients Only) Pre-Morbid Rankin Score: No symptoms Modified Rankin: Moderate disability     Balance Overall balance assessment: Needs assistance Sitting-balance support: No upper extremity supported;Feet supported Sitting balance-Leahy Scale: Good     Standing balance support: No upper extremity supported;During functional activity Standing balance-Leahy Scale: Fair                   Standardized Balance Assessment Standardized Balance Assessment : Dynamic Gait Index   Dynamic Gait Index Level Surface: Normal Change in Gait Speed: Mild Impairment Gait with Horizontal Head Turns: Mild Impairment Gait with Vertical Head Turns: Normal Gait and Pivot Turn: Mild Impairment Step Over Obstacle: Mild Impairment Step Around Obstacles: Mild Impairment Steps: Mild Impairment Total Score: 18       Pertinent Vitals/Pain Pain Assessment: No/denies pain    Home Living Family/patient expects to be discharged to:: Private residence Living Arrangements: Alone   Type of Home: House Home Access: Stairs to enter Entrance Stairs-Rails: Left Entrance Stairs-Number of Steps: 7 Home Layout: Two level;Bed/bath upstairs Home Equipment: None      Prior Function Level of Independence: Independent         Comments: exercises 3x/week; likes hot yoga     Hand Dominance        Extremity/Trunk Assessment        Lower Extremity Assessment Lower Extremity Assessment: Generalized weakness    Cervical / Trunk Assessment Cervical / Trunk Assessment: Normal  Communication   Communication: No difficulties  Cognition Arousal/Alertness: Awake/alert Behavior During Therapy: WFL for tasks assessed/performed Overall  Cognitive Status: Within Functional Limits for tasks assessed                                         General Comments General comments (skin integrity, edema, etc.): patient reports she feels nearly to abseline; feels a little "off" with balance    Exercises     Assessment/Plan    PT Assessment Patient needs continued PT services  PT Problem List Decreased strength;Decreased activity tolerance;Decreased balance;Decreased mobility;Decreased coordination;Decreased safety awareness;Decreased knowledge of use of DME;Decreased knowledge of precautions       PT Treatment Interventions DME instruction;Gait training;Stair training;Functional mobility training;Therapeutic activities;Therapeutic exercise;Balance training;Neuromuscular re-education;Patient/family education    PT Goals (Current goals can be found in the Care Plan section)  Acute Rehab PT Goals Patient Stated Goal: return to work on Monday PT Goal Formulation: With patient Time For Goal Achievement: 06/26/18 Potential to Achieve Goals: Good    Frequency Min 4X/week   Barriers to discharge        Co-evaluation               AM-PAC PT "6 Clicks" Daily Activity  Outcome Measure Difficulty turning over in bed (including adjusting bedclothes, sheets and blankets)?: A Little Difficulty moving from lying on back to sitting on the side of the bed? : A Little Difficulty sitting down on and standing up from a chair with arms (e.g., wheelchair, bedside commode, etc,.)?: A Little Help needed moving to and from a bed to chair (including a wheelchair)?: A Little Help needed walking in hospital room?: A Little Help needed climbing 3-5 steps with a railing? : A Little 6 Click Score: 18    End of Session Equipment Utilized During Treatment: Gait belt Activity Tolerance: Patient tolerated treatment well Patient left: in bed;with call bell/phone within reach Nurse Communication: Mobility status PT Visit Diagnosis: Unsteadiness on feet (R26.81);Other abnormalities of gait and mobility  (R26.89);Muscle weakness (generalized) (M62.81)    Time: 1535-1550 PT Time Calculation (min) (ACUTE ONLY): 15 min   Charges:   PT Evaluation $PT Eval Moderate Complexity: 1 Mod     PT G Codes:        Lanney Gins, PT, DPT 06/19/18 4:18 PM

## 2018-06-19 NOTE — Progress Notes (Signed)
Patient unavailable for post TPA neurological and vital signs assessment due to off unit for echocardiogram. [name]

## 2018-06-19 NOTE — Progress Notes (Signed)
  Echocardiogram 2D Echocardiogram has been performed.  Jennette Dubin 06/19/2018, 9:58 AM

## 2018-06-20 DIAGNOSIS — G459 Transient cerebral ischemic attack, unspecified: Principal | ICD-10-CM

## 2018-06-20 LAB — HIV ANTIBODY (ROUTINE TESTING W REFLEX): HIV Screen 4th Generation wRfx: NONREACTIVE

## 2018-06-20 MED ORDER — SODIUM CHLORIDE 0.9 % IV BOLUS: 500.0000 mL | Freq: Once | INTRAVENOUS | Status: DC

## 2018-06-20 MED ORDER — ASPIRIN 81 MG PO TBEC: 81.0000 mg | DELAYED_RELEASE_TABLET | Freq: Every day | ORAL | Status: DC

## 2018-06-20 MED ORDER — ROSUVASTATIN CALCIUM 20 MG PO TABS: 20.0000 mg | ORAL_TABLET | Freq: Every day | ORAL | 11 refills | Status: DC

## 2018-06-20 MED ORDER — LEVOFLOXACIN 500 MG PO TABS
500.0000 mg | ORAL_TABLET | Freq: Every day | ORAL | Status: DC
  Administered 2018-06-20: 500 mg via ORAL
  Filled 2018-06-20: qty 1

## 2018-06-20 MED ORDER — POTASSIUM CHLORIDE 20 MEQ/15ML (10%) PO SOLN
40.0000 meq | Freq: Two times a day (BID) | ORAL | Status: DC
  Administered 2018-06-20: 40 meq via ORAL
  Filled 2018-06-20: qty 30

## 2018-06-20 MED ORDER — LEVOFLOXACIN 500 MG PO TABS: 500.0000 mg | ORAL_TABLET | Freq: Every day | ORAL | 1 refills | Status: DC

## 2018-06-20 NOTE — Discharge Summary (Addendum)
Stroke Discharge Summary  Patient ID: [name]   MRN: [id number]      DOB: October 25, 1961  Date of Admission: 06/18/2018 Date of Discharge: 06/20/2018  Attending Physician:  Antony Contras MD, Stroke MD Consultant(s):   Treatment Team:  Stroke, Md, MD None  Patient's PCP:  Derinda Late, MD  DISCHARGE DIAGNOSIS: - Suspected Stroke treated with TPA Active Problems:   Stroke (cerebrum) [facility])   Past Medical History:  Diagnosis Date  . Adenomatous polyps   . Anemia   . Blood transfusion without reported diagnosis 01/1999   following hysterectomy  . Bulging discs   . Endometriosis   . Hyperlipidemia   . Hypertension   . PVC (premature ventricular contraction)   . Urinary incontinence    Past Surgical History:  Procedure Laterality Date  . ABDOMINAL HYSTERECTOMY  2001   TAH/RSO  . AUGMENTATION MAMMAPLASTY Bilateral 2013   saline. pre pectoral  . BLADDER SUSPENSION  2003   LSO and rectocele repair at [facility]  . BREAST SURGERY     breast augmentation--saline implants  . HERNIA REPAIR    . LUMBAR FUSION  2010   Dr. Carloyn Manner  . REDUCTION MAMMAPLASTY Bilateral   . SPINE SURGERY      Allergies as of 06/20/2018      Reactions   Ceftin [cefuroxime] Nausea And Vomiting   Penicillins Hives   Has patient had a PCN reaction causing immediate rash, facial/tongue/throat swelling, SOB or lightheadedness with hypotension: Yes Has patient had a PCN reaction causing severe rash involving mucus membranes or skin necrosis:Yes Has patient had a PCN reaction that required hospitalization:No Has patient had a PCN reaction occurring within the last 10 years: No If all of the above answers are "NO", then may proceed with Cephalosporin use.   Vancomycin Nausea And Vomiting, Swelling, Rash      Medication List    STOP taking these medications   albuterol 108 (90 Base) MCG/ACT inhaler Commonly known as:  PROVENTIL HFA;VENTOLIN HFA   estradiol 1 MG tablet Commonly known as:  ESTRACE    losartan 100 MG tablet Commonly known as:  COZAAR   triamterene-hydrochlorothiazide 37.5-25 MG tablet Commonly known as:  MAXZIDE-25     TAKE these medications   ALPRAZolam 0.5 MG tablet Commonly known as:  XANAX Take 0.5 mg by mouth daily as needed for anxiety. for anxiety   amLODipine 5 MG tablet Commonly known as:  NORVASC Take 5 mg by mouth daily.   aspirin 81 MG EC tablet Take 1 tablet (81 mg total) by mouth daily.   buPROPion 300 MG 24 hr tablet Commonly known as:  WELLBUTRIN XL Take 300 mg by mouth daily.   gabapentin 300 MG capsule Commonly known as:  NEURONTIN Take 1 capsule (300 mg total) by mouth 3 (three) times daily.   levofloxacin 500 MG tablet Commonly known as:  LEVAQUIN Take 1 tablet (500 mg total) by mouth daily.   rosuvastatin 20 MG tablet Commonly known as:  CRESTOR Take 1 tablet (20 mg total) by mouth daily at 6 PM. What changed:    medication strength  how much to take  when to take this   zolpidem 10 MG tablet Commonly known as:  AMBIEN Take 10 mg by mouth at bedtime as needed for sleep.       LABORATORY STUDIES CBC    Component Value Date/Time   WBC 8.3 06/18/2018 1353   RBC 3.29 (L) 06/18/2018 1353   HGB 11.6 (L)  06/18/2018 1359   HGB 11.9 (L) 04/27/2013 1157   HCT 34.0 (L) 06/18/2018 1359   PLT 317 06/18/2018 1353   MCV 103.6 (H) 06/18/2018 1353   MCV 96.8 10/19/2016 1008   MCH 32.5 06/18/2018 1353   MCHC 31.4 06/18/2018 1353   RDW 12.5 06/18/2018 1353   LYMPHSABS 1.2 06/18/2018 1353   MONOABS 0.7 06/18/2018 1353   EOSABS 0.1 06/18/2018 1353   BASOSABS 0.1 06/18/2018 1353   CMP    Component Value Date/Time   NA 136 06/18/2018 1359   K 3.4 (L) 06/18/2018 1359   CL 102 06/18/2018 1359   CO2 25 06/18/2018 1353   GLUCOSE 105 (H) 06/18/2018 1359   BUN 28 (H) 06/18/2018 1359   CREATININE 1.00 06/18/2018 1359   CALCIUM 9.5 06/18/2018 1353   PROT 6.8 06/18/2018 1353   ALBUMIN 4.0 06/18/2018 1353   AST 20 06/18/2018  1353   ALT 20 06/18/2018 1353   ALKPHOS 59 06/18/2018 1353   BILITOT 0.6 06/18/2018 1353   GFRNONAA 59 (L) 06/18/2018 1353   GFRAA >60 06/18/2018 1353   COAGS Lab Results  Component Value Date   INR 0.91 06/18/2018   INR 0.9 09/23/2008   INR 0.9 06/11/2008   Lipid Panel    Component Value Date/Time   CHOL 221 (H) 06/19/2018 1217   TRIG 161 (H) 06/19/2018 1217   HDL 107 06/19/2018 1217   CHOLHDL 2.1 06/19/2018 1217   VLDL 32 06/19/2018 1217   LDLCALC 82 06/19/2018 1217   HgbA1C  Lab Results  Component Value Date   HGBA1C 5.0 06/19/2018   Urinalysis    Component Value Date/Time   COLORURINE YELLOW 09/23/2008 1004   APPEARANCEUR CLEAR 09/23/2008 1004   LABSPEC 1.028 09/23/2008 1004   PHURINE 5.0 09/23/2008 1004   GLUCOSEU NEGATIVE 09/23/2008 1004   HGBUR NEGATIVE 09/23/2008 1004   BILIRUBINUR N 04/27/2013 0944   KETONESUR NEGATIVE 09/23/2008 1004   PROTEINUR N 04/27/2013 0944   PROTEINUR NEGATIVE 09/23/2008 1004   UROBILINOGEN negative 04/27/2013 0944   UROBILINOGEN 0.2 09/23/2008 1004   NITRITE N 04/27/2013 0944   NITRITE NEGATIVE 09/23/2008 1004   LEUKOCYTESUR Negative 04/27/2013 0944   Urine Drug Screen     Component Value Date/Time   LABOPIA NONE DETECTED 06/30/2017 1549   COCAINSCRNUR NONE DETECTED 06/30/2017 1549   LABBENZ NONE DETECTED 06/30/2017 1549   AMPHETMU NONE DETECTED 06/30/2017 1549   THCU NONE DETECTED 06/30/2017 1549   LABBARB NONE DETECTED 06/30/2017 1549    Alcohol Level    Component Value Date/Time   ETH 212 (H) 06/30/2017 1354     SIGNIFICANT DIAGNOSTIC STUDIES   IMAGING Ct Angio Neck W Or Wo Contrast Ct Angio Head W Or Wo Contrast 06/18/2018 IMPRESSION:  Negative CT angiography. Minimal atherosclerotic change at the carotid bifurcations but no stenosis or irregularity. The vessels are tortuous as might be seen with a history of hypertension.  CT Head Wo Contrast 06/19/2018 IMPRESSION: Negative CT head  Mr Brain Wo  Contrast 06/19/2018 IMPRESSION:  Stable negative MRI of the brain.    Dg Chest Portable 1 View 06/18/2018 IMPRESSION:  No active cardiopulmonary disease.    Ct Head Code Stroke Wo Contrast 06/18/2018 IMPR ESSION:  1. Cavernous carotid atherosclerosis. No signs of large vessel occlusion. No acute or focal intracranial abnormality.  2. ASPECTS is 10.     Transthoracic Echocardiogram  06/19/2017 Study Conclusions - Left ventricle: The cavity size was normal. Systolic function was   normal. The estimated  ejection fraction was in the range of 60%   to 65%. Wall motion was normal; there were no regional wall   motion abnormalities. - Mitral valve: There was mild regurgitation. Impressions: - No cardiac source of emboli was indentified.       HISTORY OF PRESENT ILLNESS Grisel Blumenstock is a 57 y.o. female with a history of hyperlipidemia and hypertension who presents with left-sided weakness that started abruptly this morning.  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  It had been persistent, with possible worsening since onset.  LKW: 12:30 PM tpa given?:  Yes ICH Score: 0 Modified Rankin Scale: 0-Completely asymptomatic and back to baseline post- stroke    HOSPITAL COURSE Ms. Maecyn Panning is a 57 y.o. female with history of PVCs, hypertension, hyperlipidemia, endometriosis, and anemia presenting with left-sided weakness. The patient received IV tpa Wednesday 06/18/2018 at 1400.  Suspected stroke:  No infarct on imaging  Resultant  Left facial numbness  CT head - No acute or focal intracranial abnormality.   MRI head - Stable negative MRI of the brain.   MRA head - not performed  CTA H&N - Negative CT angiography.  Carotid Doppler - CTA neck  2D Echo - EF 60 to 65%, No cardiac source of emboli identified.  LDL -  82  HgbA1c - 5.0  VTE prophylaxis - SCDs       Diet Order                 Diet heart healthy/carb modified Room service appropriate? Yes; Fluid consistency: Thin  Diet effective now               No antithrombotic prior to admission, now on No antithrombotic start ASA 81 mg daily  Patient counseled to be compliant with her antithrombotic medications  Ongoing aggressive stroke risk factor management  Therapy recommendations:  Outpatient physical therapy recommended.  Disposition:  Pending  Hypertension  Stable  Permissive hypertension (OK if < 220/120) but gradually normalize in 5-7 days  Long-term BP goal normotensive  Hyperlipidemia  Lipid lowering medication PTA:  Crestor 10 mg daily  LDL 82 , goal < 70  Current lipid lowering medication: Increase Crestor to 20 mg daily  Continue statin at discharge   Other Stroke Risk Factors   Former cigarette smoker - quit  ETOH use, advised to drink no more than 1 alcoholic beverage per day.   Other Active Problems  Estradiol PTA -the patient will speak to her gynecologist about stopping this medication.  Mild hypokalemia - supplement prior to discharge.  Mild dehydration - mildly low BP - mildly increased BUN -> IV fluid bolus PT discharge.  Resume Norvasc - hold Maxzide and Cozaar for now. Early F/U primary MD.  Check orthostatic BPs  Sinusitis -> Levaquin 500 mg PO daily for 5 days     DISCHARGE EXAM Vitals:   06/19/18 1924 06/19/18 2349 06/20/18 0405 06/20/18 0720  BP: (!) 108/52 127/81 121/77 98/77  Pulse:  (!) 59 67   Resp: 16  15   Temp: 98.3 F (36.8 C) 97.9 F (36.6 C) 98 F (36.7 C)   TempSrc: Oral Oral Oral   SpO2:  100% 99%   Weight:      Height:         Pleasant middle-age Caucasian lady currently not in distress. Marland Kitchen  Afebrile. Head is nontraumatic. Neck is supple without bruit.    Cardiac exam no murmur or gallop. Lungs are clear to auscultation. Distal  pulses are well felt. Neurological Exam ;  Awake  Alert oriented x 3. Normal speech and language.eye movements full without nystagmus.fundi were not visualized. Vision acuity and fields appear normal. Hearing is normal. Palatal movements are normal. Face symmetric.subjective (numbness but no objective sensory loss Tongue midline. Normal strength, tone, reflexes and coordination. Normal sensation. Gait deferred.    Discharge Diet    Diet Order           Diet Heart Room service appropriate? Yes; Fluid consistency: Thin  Diet effective now         liquids  DISCHARGE PLAN  Disposition:  Discharge to home  aspirin 81 mg daily for secondary stroke prevention.  Ongoing risk factor control by Primary Care Physician at time of discharge  Follow-up Derinda Late, MD in 2 weeks.  Follow-up in [facility] in 6 to 8 weeks, office to schedule an appointment.    DISCHARGE INSTRUCTIONS TO PATIENT  Gradually increase activity as tolerated.  Work part time for 1 week then return to work. No other restrictions.  Avoid dehydration  See OB/GYN provider to discuss stopping hormone replacement therapy  See primary care MD regarding blood pressure medications and potassium level.  Check blood pressure at home twice daily and keep a record for primary MD    30 minutes were spent preparing discharge.  Mikey Bussing PA-C [facility] Pager [phone] 06/20/2018, 10:37 AM

## 2018-06-20 NOTE — Care Management Note (Signed)
Case Management Note  Patient Details  Name: [name] MRN: [id number] Date of Birth: [birth year]  Subjective/Objective:                 Referral placed to [facility] for outpatient PT. No other CM needs.   Action/Plan:   Expected Discharge Date:  06/20/18               Expected Discharge Plan:  Home/Self Care  In-House Referral:     Discharge planning Services  CM Consult  Post Acute Care Choice:    Choice offered to:     DME Arranged:    DME Agency:     HH Arranged:    HH Agency:     Status of Service:  Completed, signed off  If discussed at [employer] of Stay Meetings, dates discussed:    Additional Comments:  Carles Collet, RN 06/20/2018, 10:31 AM

## 2018-06-20 NOTE — Discharge Instructions (Signed)
·   Gradually increase activity as tolerated.  Work part time for 1 week then return to work full time. No other restrictions.  Avoid dehydration  See OB/GYN to discuss stopping hormone replacement therapy  See primary care MD regarding blood pressure medications and potassium level.  Check blood pressure at home twice daily and keep a record for primary MD

## 2018-06-20 NOTE — Progress Notes (Signed)
Pt being discharged from hospital per orders from MD. Pt educated on discharge instructions. Pt verbalized understanding of instructions. All questions and concerns were addressed. Pt's IV was removed prior to discharge. Pt exited hospital via wheelchair accompanied by staff. 

## 2018-06-30 ENCOUNTER — Other Ambulatory Visit: Payer: Self-pay | Admitting: Obstetrics and Gynecology

## 2018-06-30 NOTE — Telephone Encounter (Signed)
Medication refill request: Estrodiol 1mg  tab  Last AEX:  03/29/17 Next AEX: 08/20/18 Last MMG (if hormonal medication request) 04/15/17  Bi rads Category 1 Neg.  Refill authorized: Please refill if appropriate.

## 2018-07-31 ENCOUNTER — Other Ambulatory Visit: Payer: Self-pay | Admitting: Obstetrics and Gynecology

## 2018-07-31 DIAGNOSIS — Z1231 Encounter for screening mammogram for malignant neoplasm of breast: Secondary | ICD-10-CM

## 2018-08-11 ENCOUNTER — Other Ambulatory Visit: Payer: Self-pay | Admitting: Obstetrics and Gynecology

## 2018-08-11 NOTE — Telephone Encounter (Signed)
Message left to return call to [facility] at [phone].   Patient needs to advise if she is still taking estradiol. Patient also is overdue for annual exam and needs to schedule.

## 2018-08-12 NOTE — Telephone Encounter (Signed)
Mychart message sent asking patient about estradiol rx.

## 2018-08-19 ENCOUNTER — Other Ambulatory Visit: Payer: Self-pay

## 2018-08-19 NOTE — Telephone Encounter (Signed)
Patient is still taking estrace 1 mg, daily.  She has made a follow up appointment but needs a refill to get through.  She also has her mammogram scheduled.  She looses insurance after sept 12.

## 2018-08-20 ENCOUNTER — Ambulatory Visit: Payer: BLUE CROSS/BLUE SHIELD | Admitting: Adult Health

## 2018-08-27 NOTE — Progress Notes (Signed)
[facility] [address] [place] [place] [address] [phone]       OFFICE FOLLOW UP NOTE  [name] Date of Birth:  [birth year] Medical Record Number:  [id number]   Reason for Referral:  hospital stroke follow up  CHIEF COMPLAINT:  Chief Complaint  Patient presents with  . Follow-up    Hospital Stroke follow up room 9 patient alone    HPI: [name] is being seen today for initial visit in the office for suspected stroke s/p tPA on 06/18/2018. History obtained from patient and chart review. Reviewed all radiology images and labs personally.  [name] is a 57 y.o. female with history of PVCs, hypertension, hyperlipidemia, endometriosis, and anemia presenting with left-sided weakness.   CT had reviewed and was negative for acute infarct or hemorrhage.  Due to continued symptoms, tPA was administered on 06/18/2018 at 9767 without complications and residual left facial numbness.  MRI head reviewed and was negative for acute abnormality.  CTA head and neck negative.  2D echo showed an EF of 60 to 65% without cardiac source of embolism.  LDL 82 and his patient was on Crestor 10 mg daily PTA recommended increase to Crestor 20 mg daily.  HTN stable during admission and recommended long-term BP goal normotensive range.  A1c 5.0.  Patient was not on antithrombotic prior to admission and recommended aspirin 81 mg daily. Patient discharged in stable condition.   Patient is being seen today for hospital follow-up.  He does complain of intermittent word finding difficulty, difficulty with swallowing carbonated beverages or harder foods such as chips and short-term memory loss.  She states all this has been present since her stroke but has been improving.  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  She did not need additional therapies at hospital discharge and currently declined surgeries at this time for continued complaints.   She does continue to take aspirin 81 mg without side effects of bleeding or bruising.  She did increase Crestor to 20 mg but states she has had increase in leg cramps and foot cramps that do wake her up at night.  She was experiencing this some with Crestor 10 mg but has increased cramping since increase in dosage.  Blood pressure today 127/81.  She states her blood pressure medications were stopped during hospital admission due to having normal blood pressure range but has restarted these since she returned home due to blood pressure being elevated at 160 over 90s without taking the medications.  Since she restarted, SBP ranges 1 20-1 30.  She has been weaning off of estradiol as recommended and will be following with her OB/GYN next week.  Denies any sleep apnea symptoms such as snoring or excessive daytime fatigue.  Denies any history or current migraine/headaches.  Denies new or worsening stroke/TIA symptoms.    ROS:   14 system review of systems performed and negative with exception of trouble swallowing and incontinence  PMH:  Past Medical History:  Diagnosis Date  . Adenomatous polyps   . Anemia   . Blood transfusion without reported diagnosis 01/1999   following hysterectomy  . Bulging discs   . Endometriosis   . Hyperlipidemia   . Hypertension   . PVC (premature ventricular contraction)   . Urinary incontinence     PSH:  Past Surgical History:  Procedure Laterality Date  . 5 back surgeries    . ABDOMINAL HYSTERECTOMY  2001  TAH/RSO  . AUGMENTATION MAMMAPLASTY Bilateral 2013   saline. pre pectoral  . BLADDER SUSPENSION  2003   LSO and rect[facility]cele repair at [facility]  . BREAST SURGERY     breast augmentation--saline implants  . HERNIA REPAIR    . LUMBAR FUSION  2010   Dr. Carloyn Manner  . REDUCTION MAMMAPLASTY Bilateral   . SPINE SURGERY      Social History:  Social History   Socioeconomic History  . Marital status: Divorced    Spouse name: Not on file  . Number of children: 3    . Years of education: Not on file  . Highest education level: Not on file  Occupational History  . Not on file  Social Needs  . Financial resource strain: Not on file  . Food insecurity:    Worry: Not on file    Inability: Not on file  . Transportation needs:    Medical: Not on file    Non-medical: Not on file  Tobacco Use  . Smoking status: Former Smoker    Types: Cigarettes  . Smokeless tobacco: Never Used  Substance and Sexual Activity  . Alcohol use: Yes    Alcohol/week: 6.0 - 8.0 standard drinks    Types: 6 - 8 Standard drinks or equivalent per week    Comment: occasionally   . Drug use: Yes    Types: Marijuana    Comment: occassionally  . Sexual activity: Yes    Partners: Male    Birth control/protection: Surgical    Comment: TAH  Lifestyle  . Physical activity:    Days per week: Not on file    Minutes per session: Not on file  . Stress: Not on file  Relationships  . Social connections:    Talks on phone: Not on file    Gets together: Not on file    Attends religious service: Not on file    Active member of club or organization: Not on file    Attends meetings of clubs or organizations: Not on file    Relationship status: Not on file  . Intimate partner violence:    Fear of current or ex partner: Not on file    Emotionally abused: Not on file    Physically abused: Not on file    Forced sexual activity: Not on file  Other Topics Concern  . Not on file  Social History Narrative  . Not on file    Family History:  Family History  Problem Relation Age of Onset  . Hypertension Mother   . Hypertension Father   . Cancer Father   . Hyperlipidemia Sister   . Hypertension Sister   . Hyperlipidemia Brother   . Hypertension Brother   . Hypertension Brother   . Hypertension Brother   . Cancer Maternal Uncle 72       colon ca  . Stroke Maternal Uncle   . Breast cancer Maternal Grandmother   . Cancer Maternal Grandmother 67       colon ca  . Stroke  Maternal Aunt   . Stroke Paternal Grandfather     Medications:   Current Outpatient Medications on File Prior to Visit  Medication Sig Dispense Refill  . ALPRAZolam (XANAX) 0.5 MG tablet Take 0.5 mg by mouth daily as needed for anxiety. for anxiety  1  . aspirin EC 81 MG EC tablet Take 1 tablet (81 mg total) by mouth daily.    Marland Kitchen buPROPion (WELLBUTRIN XL) 300 MG 24 hr tablet Take  300 mg by mouth daily.    Marland Kitchen losartan (COZAAR) 100 MG tablet losartan potassium 100 mg tabs    . metoprolol succinate (TOPROL-XL) 50 MG 24 hr tablet metoprolol succinate er 50 mg tb24    . rosuvastatin (CRESTOR) 20 MG tablet Take 1 tablet (20 mg total) by mouth daily at 6 PM. 30 tablet 11  . triamterene-hydrochlorothiazide (MAXZIDE-25) 37.5-25 MG tablet Take 1 tablet by mouth daily.    Marland Kitchen zolpidem (AMBIEN) 10 MG tablet Take 10 mg by mouth at bedtime as needed for sleep.     No current facility-administered medications on file prior to visit.     Allergies:   Allergies  Allergen Reactions  . Ancef [Cefazolin]     Cause rash shortness of breath and vomiting   . Ceftin [Cefuroxime] Nausea And Vomiting  . Penicillins Hives    Has patient had a PCN reaction causing immediate rash, facial/tongue/throat swelling, SOB or lightheadedness with hypotension: Yes Has patient had a PCN reaction causing severe rash involving mucus membranes or skin necrosis:Yes Has patient had a PCN reaction that required hospitalization:No Has patient had a PCN reaction occurring within the last 10 years: No If all of the above answers are "NO", then may proceed with Cephalosporin use.      Physical Exam  Vitals:   08/28/18 0934  BP: 127/81  Pulse: 66  Weight: 140 lb 11.2 oz (63.8 kg)  Height: 5\' 5"  (1.651 m)   Body mass index is 23.41 kg/m. No exam data present  General: well developed, well nourished, pleasant middle-aged Caucasian female, seated, in no evident distress Head: head normocephalic and atraumatic.   Neck:  supple with no carotid or supraclavicular bruits Cardiovascular: regular rate and rhythm, no murmurs Musculoskeletal: no deformity Skin:  no rash/petichiae Vascular:  Normal pulses all extremities  Neurologic Exam Mental Status: Awake and fully alert. Oriented to place and time. Recent and remote memory intact. Attention span, concentration and fund of knowledge appropriate. Mood and affect appropriate.  Cranial Nerves: Fundoscopic exam reveals sharp disc margins. Pupils equal, briskly reactive to light. Extraocular movements full without nystagmus. Visual fields full to confrontation. Hearing intact. Facial sensation intact. Face, tongue, palate moves normally and symmetrically.  Motor: Normal bulk and tone. Normal strength in all tested extremity muscles. Sensory.: intact to touch , pinprick , position and vibratory sensation.  Coordination: Rapid alternating movements normal in all extremities. Finger-to-nose and heel-to-shin performed accurately bilaterally. Gait and Station: Arises from chair without difficulty. Stance is normal. Gait demonstrates normal stride length and balance . Able to heel, toe and tandem walk without difficulty.  Reflexes: 1+ and symmetric. Toes downgoing.    NIHSS  0 Modified Rankin  1   Diagnostic Data (Labs, Imaging, Testing)  CT Head Wo Contrast 06/19/2018 IMPRESSION: Negative CT head  Ct Angio Neck W Or Wo Contrast Ct Angio Head W Or Wo Contrast 06/18/2018 IMPRESSION:  Negative CT angiography. Minimal atherosclerotic change at the carotid bifurcations but no stenosis or irregularity. The vessels are tortuous as might be seen with a history of hypertension.  Mr Brain Wo Contrast 06/19/2018 IMPRESSION:  Stable negative MRI of the brain.   Transthoracic Echocardiogram  06/19/2017 Study Conclusions - Left ventricle: The cavity size was normal. Systolic function was normal. The estimated ejection fraction was in the range of 60% to 65%. Wall  motion was normal; there were no regional wall motion abnormalities. - Mitral valve: There was mild regurgitation. Impressions: - No cardiac source of emboli was  indentified.    ASSESSMENT: [name] is a 57 y.o. year old female here with suspected stroke s/p tPA on 06/18/2018. Vascular risk factors include HTN and HLD.  Patient returns today for hospital follow-up and does have residual deficits of aphasia and dysphagia with some cognitive concerns but these all have been improving.    PLAN: -Continue aspirin 81 mg daily  and Crestor 20 mg and recommend start Zetia 10 mg daily for secondary stroke prevention -F/u with PCP regarding your HLD and HTN management -patient will talk to PCP in regards to decreasing Crestor back to 10 mg or possibly stopping altogether due to statin myalgias.  Advised patient that if Zetia does not manage cholesterol levels appropriately, to consider starting Repatha at that time -Continue to wean off estradiol and follow-up with OB/GYN as scheduled -Consider speech therapy in the future if intermittent aphasia, occasional dysphagia and cognitive concerns do not continue to resolve -continue to monitor BP at home -advised to continue to stay active and maintain a healthy diet -Maintain strict control of hypertension with blood pressure goal below 130/90, diabetes with hemoglobin A1c goal below 6.5% and cholesterol with LDL cholesterol (bad cholesterol) goal below 70 mg/dL. I also advised the patient to eat a healthy diet with plenty of whole grains, cereals, fruits and vegetables, exercise regularly and maintain ideal body weight.  Follow up in 3 months or call earlier if needed   Greater than 50% of time during this 25 minute visit was spent on counseling,explanation of diagnosis of suspected stroke s/p TPA, reviewing risk factor management of HLD and HTN, planning of further management, discussion with patient and family and coordination of  care    Venancio Poisson, AGNP-BC  [facility] [address] [place], [place] [address]  Phone [phone] Fax [phone] Note: This document was prepared with digital dictation and possible smart phrase technology. Any transcriptional errors that result from this process are unintentional.

## 2018-08-28 ENCOUNTER — Ambulatory Visit (INDEPENDENT_AMBULATORY_CARE_PROVIDER_SITE_OTHER): Payer: BLUE CROSS/BLUE SHIELD | Admitting: Adult Health

## 2018-08-28 ENCOUNTER — Encounter: Payer: Self-pay | Admitting: Adult Health

## 2018-08-28 ENCOUNTER — Ambulatory Visit
Admission: RE | Admit: 2018-08-28 | Discharge: 2018-08-28 | Disposition: A | Discharge status: Home / Self Care | Payer: BLUE CROSS/BLUE SHIELD | Source: Ambulatory Visit | Attending: Obstetrics and Gynecology | Admitting: Obstetrics and Gynecology

## 2018-08-28 ENCOUNTER — Telehealth: Payer: Self-pay | Admitting: Adult Health

## 2018-08-28 VITALS — BP 127/81 | HR 66 | Ht 65.0 in | Wt 140.7 lb

## 2018-08-28 DIAGNOSIS — I10 Essential (primary) hypertension: Secondary | ICD-10-CM

## 2018-08-28 DIAGNOSIS — I6350 Cerebral infarction due to unspecified occlusion or stenosis of unspecified cerebral artery: Secondary | ICD-10-CM | POA: Diagnosis not present

## 2018-08-28 DIAGNOSIS — E785 Hyperlipidemia, unspecified: Secondary | ICD-10-CM

## 2018-08-28 DIAGNOSIS — Z1231 Encounter for screening mammogram for malignant neoplasm of breast: Secondary | ICD-10-CM

## 2018-08-28 MED ORDER — EZETIMIBE 10 MG PO TABS: 10.0000 mg | ORAL_TABLET | Freq: Every day | ORAL | 4 refills | Status: AC

## 2018-08-28 NOTE — Telephone Encounter (Signed)
Patient states she will call back to schedule 3 month f/u w/ Janett Billow, NP

## 2018-08-28 NOTE — Patient Instructions (Signed)
Continue aspirin 81 mg daily  and Crestor 20mg  along with starting Zetia 10mg   for secondary stroke prevention  Continue to follow up with PCP regarding cholesterol and blood pressure management - speak to your primary regarding decreasing Crestor or potentially stopping it due to muscle aches or pains. If your cholesterol if unable to be controlled with Zeita then we could consider starting Repatha.   Continue to stay active and maintain a healthy diet  If you do not continue to improve, let us know and we can consider starting speech therapy  Continue to monitor blood pressure at home  Maintain strict control of hypertension with blood pressure goal below 130/90, diabetes with hemoglobin A1c goal below 6.5% and cholesterol with LDL cholesterol (bad cholesterol) goal below 70 mg/dL. I also advised the patient to eat a healthy diet with plenty of whole grains, cereals, fruits and vegetables, exercise regularly and maintain ideal body weight.  Followup in the future with me in 3 months or call earlier if needed       Thank you for coming to see [place] at [facility]. I hope we have been able to provide you high quality care today.  You may receive a patient satisfaction survey over the next few weeks. We would appreciate your feedback and comments so that we may continue to improve ourselves and the health of our patients.

## 2018-08-29 NOTE — Progress Notes (Signed)
I agree with the above plan 

## 2018-09-01 ENCOUNTER — Ambulatory Visit: Payer: BLUE CROSS/BLUE SHIELD | Admitting: Obstetrics and Gynecology

## 2018-09-01 ENCOUNTER — Telehealth: Payer: Self-pay | Admitting: Obstetrics and Gynecology

## 2018-09-01 ENCOUNTER — Encounter: Payer: Self-pay | Admitting: Obstetrics and Gynecology

## 2018-09-01 NOTE — Telephone Encounter (Signed)
I will be happy to review this with her at her office visit.  Encounter closed.

## 2018-09-01 NOTE — Progress Notes (Deleted)
GYNECOLOGY  VISIT   HPI: 57 y.o.   Divorced  Caucasian  female   [phone] with No LMP recorded. Patient has had a hysterectomy.   here for     GYNECOLOGIC HISTORY: No LMP recorded. Patient has had a hysterectomy. Contraception:  Hysterectomy Menopausal hormone therapy:  none Last mammogram:  08/28/18 BIRADS 1 negative/density b Last pap smear:   2013 Normal        OB History    Gravida  5   Para  3   Term  3   Preterm      AB  2   Living  3     SAB  2   TAB      Ectopic      Multiple      Live Births                 Patient Active Problem List   Diagnosis Date Noted  . Stroke (cerebrum) ([place]) 06/18/2018  . Adjustment disorder with depressed mood 07/01/2017  . Lumbar stenosis with neurogenic claudication 02/09/2017  . Lumbar stenosis 02/09/2017  . Mixed incontinence 08/05/2014  . HTN (hypertension) 02/07/2012  . Depression 02/07/2012  . Dyslipidemia 02/07/2012  . Menopausal and perimenopausal disorder 02/07/2012    Past Medical History:  Diagnosis Date  . Adenomatous polyps   . Anemia   . Blood transfusion without reported diagnosis 01/1999   following hysterectomy  . Bulging discs   . Endometriosis   . Hyperlipidemia   . Hypertension   . PVC (premature ventricular contraction)   . Urinary incontinence     Past Surgical History:  Procedure Laterality Date  . 5 back surgeries    . ABDOMINAL HYSTERECTOMY  2001   TAH/RSO  . AUGMENTATION MAMMAPLASTY Bilateral 2013   saline. pre pectoral  . BLADDER SUSPENSION  2003   LSO and rect[facility]cele repair at [facility]  . BREAST SURGERY     breast augmentation--saline implants  . HERNIA REPAIR    . LUMBAR FUSION  2010   Dr. Carloyn Manner  . REDUCTION MAMMAPLASTY Bilateral   . SPINE SURGERY      Current Outpatient Medications  Medication Sig Dispense Refill  . ALPRAZolam (XANAX) 0.5 MG tablet Take 0.5 mg by mouth daily as needed for anxiety. for anxiety  1  . aspirin EC 81 MG EC tablet Take 1 tablet (81 mg total) by  mouth daily.    Marland Kitchen buPROPion (WELLBUTRIN XL) 300 MG 24 hr tablet Take 300 mg by mouth daily.    Marland Kitchen ezetimibe (ZETIA) 10 MG tablet Take 1 tablet (10 mg total) by mouth daily. 30 tablet 4  . losartan (COZAAR) 100 MG tablet losartan potassium 100 mg tabs    . metoprolol succinate (TOPROL-XL) 50 MG 24 hr tablet metoprolol succinate er 50 mg tb24    . rosuvastatin (CRESTOR) 20 MG tablet Take 1 tablet (20 mg total) by mouth daily at 6 PM. 30 tablet 11  . triamterene-hydrochlorothiazide (MAXZIDE-25) 37.5-25 MG tablet Take 1 tablet by mouth daily.    Marland Kitchen zolpidem (AMBIEN) 10 MG tablet Take 10 mg by mouth at bedtime as needed for sleep.     No current facility-administered medications for this visit.      ALLERGIES: Ancef [cefazolin]; Ceftin [cefuroxime]; and Penicillins  Family History  Problem Relation Age of Onset  . Hypertension Mother   . Hypertension Father   . Cancer Father   . Hyperlipidemia Sister   . Hypertension Sister   . Hyperlipidemia  Brother   . Hypertension Brother   . Hypertension Brother   . Hypertension Brother   . Cancer Maternal Uncle 72       colon ca  . Stroke Maternal Uncle   . Breast cancer Maternal Grandmother   . Cancer Maternal Grandmother 21       colon ca  . Stroke Maternal Aunt   . Stroke Paternal Grandfather     Social History   Socioeconomic History  . Marital status: Divorced    Spouse name: Not on file  . Number of children: 3  . Years of education: Not on file  . Highest education level: Not on file  Occupational History  . Not on file  Social Needs  . Financial resource strain: Not on file  . Food insecurity:    Worry: Not on file    Inability: Not on file  . Transportation needs:    Medical: Not on file    Non-medical: Not on file  Tobacco Use  . Smoking status: Former Smoker    Types: Cigarettes  . Smokeless tobacco: Never Used  Substance and Sexual Activity  . Alcohol use: Yes    Alcohol/week: 6.0 - 8.0 standard drinks    Types: 6  - 8 Standard drinks or equivalent per week    Comment: occasionally   . Drug use: Yes    Types: Marijuana    Comment: occassionally  . Sexual activity: Yes    Partners: Male    Birth control/protection: Surgical    Comment: TAH  Lifestyle  . Physical activity:    Days per week: Not on file    Minutes per session: Not on file  . Stress: Not on file  Relationships  . Social connections:    Talks on phone: Not on file    Gets together: Not on file    Attends religious service: Not on file    Active member of club or organization: Not on file    Attends meetings of clubs or organizations: Not on file    Relationship status: Not on file  . Intimate partner violence:    Fear of current or ex partner: Not on file    Emotionally abused: Not on file    Physically abused: Not on file    Forced sexual activity: Not on file  Other Topics Concern  . Not on file  Social History Narrative  . Not on file    Review of Systems  PHYSICAL EXAMINATION:    There were no vitals taken for this visit.    General appearance: alert, cooperative and appears stated age Head: Normocephalic, without obvious abnormality, atraumatic Neck: no adenopathy, supple, symmetrical, trachea midline and thyroid normal to inspection and palpation Lungs: clear to auscultation bilaterally Breasts: normal appearance, no masses or tenderness, No nipple retraction or dimpling, No nipple discharge or bleeding, No axillary or supraclavicular adenopathy Heart: regular rate and rhythm Abdomen: soft, non-tender, no masses,  no organomegaly Extremities: extremities normal, atraumatic, no cyanosis or edema Skin: Skin color, texture, turgor normal. No rashes or lesions Lymph nodes: Cervical, supraclavicular, and axillary nodes normal. No abnormal inguinal nodes palpated Neurologic: Grossly normal  Pelvic: External genitalia:  no lesions              Urethra:  normal appearing urethra with no masses, tenderness or  lesions              Bartholins and Skenes: normal  Vagina: normal appearing vagina with normal color and discharge, no lesions              Cervix: no lesions                Bimanual Exam:  Uterus:  normal size, contour, position, consistency, mobility, non-tender              Adnexa: no mass, fullness, tenderness              Rectal exam: {yes no:314532}.  Confirms.              Anus:  normal sphincter tone, no lesions  Chaperone was present for exam.  ASSESSMENT     PLAN     An After Visit Summary was printed and given to the patient.  ______ minutes face to face time of which over 50% was spent in counseling.

## 2018-09-01 NOTE — Telephone Encounter (Addendum)
Called patient regarding missed appointment today. She forgot and rescheduled to 09/10/18 at 11:30. Patient is due for aex but did not wish to schedule because she says she does not get paps anymore and does not need to come in for an aex.

## 2018-09-09 ENCOUNTER — Telehealth: Payer: Self-pay | Admitting: Obstetrics and Gynecology

## 2018-09-09 NOTE — Telephone Encounter (Signed)
Patient cancelled her problem visit for Wednesday. Please see separate staff message.

## 2018-09-10 ENCOUNTER — Ambulatory Visit: Payer: BLUE CROSS/BLUE SHIELD | Admitting: Obstetrics and Gynecology

## 2018-09-10 NOTE — Telephone Encounter (Signed)
Thank you for the update.  Staff message received.  Encounter closed.

## 2018-09-18 ENCOUNTER — Other Ambulatory Visit: Payer: Self-pay

## 2018-09-25 ENCOUNTER — Other Ambulatory Visit: Payer: Self-pay

## 2018-09-29 ENCOUNTER — Telehealth: Payer: Self-pay | Admitting: Adult Health

## 2018-09-29 NOTE — Telephone Encounter (Signed)
RN spoke with [name] at Dr. Estill Bamberg office pts PCP. RN ask [name] if their office was doing the lumbar epidural injection. [name] stated they are referring the patient to [place] for the procedure. Rn stated GI would need to fax the clearance form to our office if it can be approve or not by Janett Billow NP. [name] verbalized understanding.

## 2018-09-29 NOTE — Telephone Encounter (Signed)
Sherry with Dr. Deboraha Sprang office calling because patient is requesting a lumbar epidural injection in their office and wants to know if it is ok for patient to come off aspirin. Please call and advise.

## 2018-09-30 ENCOUNTER — Other Ambulatory Visit: Payer: Self-pay | Admitting: Family Medicine

## 2018-09-30 DIAGNOSIS — M5416 Radiculopathy, lumbar region: Secondary | ICD-10-CM

## 2018-10-01 ENCOUNTER — Other Ambulatory Visit: Payer: Self-pay

## 2018-10-01 NOTE — Patient Outreach (Signed)
Telephone outreach to patient to obtain mRS was successfully completed. mRS = 1 

## 2018-10-21 ENCOUNTER — Inpatient Hospital Stay
Admission: RE | Admit: 2018-10-21 | Discharge: 2018-10-21 | Disposition: A | Discharge status: Home / Self Care | Payer: Self-pay | Source: Ambulatory Visit | Attending: Family Medicine | Admitting: Family Medicine

## 2019-01-15 ENCOUNTER — Encounter (HOSPITAL_COMMUNITY): Payer: Self-pay | Admitting: Internal Medicine

## 2019-01-15 ENCOUNTER — Emergency Department (HOSPITAL_COMMUNITY): Payer: BLUE CROSS/BLUE SHIELD

## 2019-01-15 ENCOUNTER — Other Ambulatory Visit: Payer: Self-pay

## 2019-01-15 ENCOUNTER — Inpatient Hospital Stay (HOSPITAL_COMMUNITY)
Admission: EM | Admit: 2019-01-15 | Discharge: 2019-01-18 | DRG: 202 | Disposition: A | Discharge status: Home / Self Care | Payer: BLUE CROSS/BLUE SHIELD | Attending: Internal Medicine | Admitting: Internal Medicine

## 2019-01-15 ENCOUNTER — Observation Stay (HOSPITAL_BASED_OUTPATIENT_CLINIC_OR_DEPARTMENT_OTHER): Payer: BLUE CROSS/BLUE SHIELD

## 2019-01-15 DIAGNOSIS — J9601 Acute respiratory failure with hypoxia: Secondary | ICD-10-CM | POA: Diagnosis not present

## 2019-01-15 DIAGNOSIS — E785 Hyperlipidemia, unspecified: Secondary | ICD-10-CM | POA: Diagnosis present

## 2019-01-15 DIAGNOSIS — R911 Solitary pulmonary nodule: Secondary | ICD-10-CM | POA: Diagnosis present

## 2019-01-15 DIAGNOSIS — G4709 Other insomnia: Secondary | ICD-10-CM | POA: Diagnosis present

## 2019-01-15 DIAGNOSIS — Z823 Family history of stroke: Secondary | ICD-10-CM

## 2019-01-15 DIAGNOSIS — J209 Acute bronchitis, unspecified: Secondary | ICD-10-CM | POA: Diagnosis not present

## 2019-01-15 DIAGNOSIS — R739 Hyperglycemia, unspecified: Secondary | ICD-10-CM | POA: Diagnosis present

## 2019-01-15 DIAGNOSIS — F329 Major depressive disorder, single episode, unspecified: Secondary | ICD-10-CM | POA: Diagnosis present

## 2019-01-15 DIAGNOSIS — Z90721 Acquired absence of ovaries, unilateral: Secondary | ICD-10-CM

## 2019-01-15 DIAGNOSIS — F17290 Nicotine dependence, other tobacco product, uncomplicated: Secondary | ICD-10-CM | POA: Diagnosis present

## 2019-01-15 DIAGNOSIS — F1290 Cannabis use, unspecified, uncomplicated: Secondary | ICD-10-CM | POA: Diagnosis present

## 2019-01-15 DIAGNOSIS — Z9882 Breast implant status: Secondary | ICD-10-CM

## 2019-01-15 DIAGNOSIS — R0602 Shortness of breath: Secondary | ICD-10-CM | POA: Diagnosis not present

## 2019-01-15 DIAGNOSIS — Z880 Allergy status to penicillin: Secondary | ICD-10-CM

## 2019-01-15 DIAGNOSIS — Z793 Long term (current) use of hormonal contraceptives: Secondary | ICD-10-CM

## 2019-01-15 DIAGNOSIS — I10 Essential (primary) hypertension: Secondary | ICD-10-CM | POA: Diagnosis present

## 2019-01-15 DIAGNOSIS — T380X5A Adverse effect of glucocorticoids and synthetic analogues, initial encounter: Secondary | ICD-10-CM | POA: Diagnosis present

## 2019-01-15 DIAGNOSIS — F419 Anxiety disorder, unspecified: Secondary | ICD-10-CM | POA: Diagnosis present

## 2019-01-15 DIAGNOSIS — Z79899 Other long term (current) drug therapy: Secondary | ICD-10-CM

## 2019-01-15 DIAGNOSIS — F32A Depression, unspecified: Secondary | ICD-10-CM | POA: Diagnosis present

## 2019-01-15 DIAGNOSIS — Z86010 Personal history of colonic polyps: Secondary | ICD-10-CM

## 2019-01-15 DIAGNOSIS — Z8679 Personal history of other diseases of the circulatory system: Secondary | ICD-10-CM

## 2019-01-15 DIAGNOSIS — Z8349 Family history of other endocrine, nutritional and metabolic diseases: Secondary | ICD-10-CM

## 2019-01-15 DIAGNOSIS — Z90710 Acquired absence of both cervix and uterus: Secondary | ICD-10-CM

## 2019-01-15 DIAGNOSIS — Z716 Tobacco abuse counseling: Secondary | ICD-10-CM

## 2019-01-15 DIAGNOSIS — Z7982 Long term (current) use of aspirin: Secondary | ICD-10-CM

## 2019-01-15 DIAGNOSIS — Z8249 Family history of ischemic heart disease and other diseases of the circulatory system: Secondary | ICD-10-CM

## 2019-01-15 DIAGNOSIS — Z800 Family history of malignant neoplasm of digestive organs: Secondary | ICD-10-CM

## 2019-01-15 DIAGNOSIS — Z981 Arthrodesis status: Secondary | ICD-10-CM

## 2019-01-15 DIAGNOSIS — Z8673 Personal history of transient ischemic attack (TIA), and cerebral infarction without residual deficits: Secondary | ICD-10-CM

## 2019-01-15 DIAGNOSIS — Z803 Family history of malignant neoplasm of breast: Secondary | ICD-10-CM

## 2019-01-15 DIAGNOSIS — Z881 Allergy status to other antibiotic agents status: Secondary | ICD-10-CM

## 2019-01-15 LAB — CBC WITH DIFFERENTIAL/PLATELET
Abs Immature Granulocytes: 0.05 10*3/uL (ref 0.00–0.07)
Basophils Absolute: 0 10*3/uL (ref 0.0–0.1)
Basophils Relative: 0 %
Eosinophils Absolute: 0 10*3/uL (ref 0.0–0.5)
Eosinophils Relative: 0 %
HCT: 37.8 % (ref 36.0–46.0)
Hemoglobin: 12.2 g/dL (ref 12.0–15.0)
Immature Granulocytes: 0 %
Lymphocytes Relative: 5 %
Lymphs Abs: 0.6 10*3/uL — ABNORMAL LOW (ref 0.7–4.0)
MCH: 32.5 pg (ref 26.0–34.0)
MCHC: 32.3 g/dL (ref 30.0–36.0)
MCV: 100.8 fL — ABNORMAL HIGH (ref 80.0–100.0)
Monocytes Absolute: 0.3 10*3/uL (ref 0.1–1.0)
Monocytes Relative: 3 %
Neutro Abs: 10.7 10*3/uL — ABNORMAL HIGH (ref 1.7–7.7)
Neutrophils Relative %: 92 %
Platelets: 283 10*3/uL (ref 150–400)
RBC: 3.75 MIL/uL — ABNORMAL LOW (ref 3.87–5.11)
RDW: 13.1 % (ref 11.5–15.5)
WBC: 11.7 10*3/uL — ABNORMAL HIGH (ref 4.0–10.5)
nRBC: 0 % (ref 0.0–0.2)

## 2019-01-15 LAB — BRAIN NATRIURETIC PEPTIDE: B Natriuretic Peptide: 24.5 pg/mL (ref 0.0–100.0)

## 2019-01-15 LAB — BLOOD GAS, VENOUS
Acid-base deficit: 0.9 mmol/L (ref 0.0–2.0)
Bicarbonate: 24 mmol/L (ref 20.0–28.0)
Drawn by: 11249
O2 Content: 2 L/min
O2 Saturation: 88.2 %
Patient temperature: 98.6
pCO2, Ven: 42.4 mmHg — ABNORMAL LOW (ref 44.0–60.0)
pH, Ven: 7.371 (ref 7.250–7.430)
pO2, Ven: 58 mmHg — ABNORMAL HIGH (ref 32.0–45.0)

## 2019-01-15 LAB — COMPREHENSIVE METABOLIC PANEL
ALT: 21 U/L (ref 0–44)
AST: 25 U/L (ref 15–41)
Albumin: 4.9 g/dL (ref 3.5–5.0)
Alkaline Phosphatase: 71 U/L (ref 38–126)
Anion gap: 16 — ABNORMAL HIGH (ref 5–15)
BUN: 21 mg/dL — ABNORMAL HIGH (ref 6–20)
CO2: 22 mmol/L (ref 22–32)
Calcium: 9.4 mg/dL (ref 8.9–10.3)
Chloride: 104 mmol/L (ref 98–111)
Creatinine, Ser: 0.97 mg/dL (ref 0.44–1.00)
GFR calc Af Amer: >60 mL/min (ref >60)
GFR calc non Af Amer: >60 mL/min (ref >60)
Glucose, Bld: 169 mg/dL — ABNORMAL HIGH (ref 70–99)
Potassium: 3.5 mmol/L (ref 3.5–5.1)
Sodium: 142 mmol/L (ref 135–145)
Total Bilirubin: 0.9 mg/dL (ref 0.3–1.2)
Total Protein: 8.3 g/dL — ABNORMAL HIGH (ref 6.5–8.1)

## 2019-01-15 LAB — RESPIRATORY PANEL BY PCR

## 2019-01-15 LAB — GLUCOSE, CAPILLARY: Glucose-Capillary: 128 mg/dL — ABNORMAL HIGH (ref 70–99)

## 2019-01-15 LAB — I-STAT TROPONIN, ED: Troponin i, poc: 0 ng/mL (ref 0.00–0.08)

## 2019-01-15 LAB — MAGNESIUM: Magnesium: 2.3 mg/dL (ref 1.7–2.4)

## 2019-01-15 LAB — PHOSPHORUS: Phosphorus: 3.5 mg/dL (ref 2.5–4.6)

## 2019-01-15 LAB — INFLUENZA PANEL BY PCR (TYPE A & B)
Influenza A By PCR: NEGATIVE
Influenza B By PCR: NEGATIVE

## 2019-01-15 LAB — MRSA PCR SCREENING: MRSA by PCR: NEGATIVE

## 2019-01-15 LAB — ECHOCARDIOGRAM COMPLETE

## 2019-01-15 MED ORDER — IPRATROPIUM BROMIDE 0.02 % IN SOLN: 0.5000 mg | Freq: Once | RESPIRATORY_TRACT | Status: DC

## 2019-01-15 MED ORDER — LORAZEPAM 2 MG/ML IJ SOLN
1.0000 mg | Freq: Once | INTRAMUSCULAR | Status: AC
  Administered 2019-01-15: 1 mg via INTRAVENOUS
  Filled 2019-01-15: qty 1

## 2019-01-15 MED ORDER — ALBUTEROL SULFATE (2.5 MG/3ML) 0.083% IN NEBU
5.0000 mg | INHALATION_SOLUTION | Freq: Once | RESPIRATORY_TRACT | Status: DC
  Filled 2019-01-15: qty 6

## 2019-01-15 MED ORDER — ENOXAPARIN SODIUM 40 MG/0.4ML ~~LOC~~ SOLN: 40.0000 mg | SUBCUTANEOUS | Status: DC

## 2019-01-15 MED ORDER — ALBUTEROL SULFATE (2.5 MG/3ML) 0.083% IN NEBU
5.0000 mg | INHALATION_SOLUTION | Freq: Once | RESPIRATORY_TRACT | Status: AC
  Administered 2019-01-15: 5 mg via RESPIRATORY_TRACT
  Filled 2019-01-15: qty 6

## 2019-01-15 MED ORDER — LORAZEPAM 2 MG/ML IJ SOLN
0.5000 mg | Freq: Once | INTRAMUSCULAR | Status: AC
  Administered 2019-01-15: 0.5 mg via INTRAVENOUS
  Filled 2019-01-15: qty 1

## 2019-01-15 MED ORDER — ENOXAPARIN SODIUM 40 MG/0.4ML ~~LOC~~ SOLN
40.0000 mg | SUBCUTANEOUS | Status: DC
  Administered 2019-01-15 – 2019-01-17 (×3): 40 mg via SUBCUTANEOUS
  Filled 2019-01-15 (×3): qty 0.4

## 2019-01-15 MED ORDER — ASPIRIN EC 81 MG PO TBEC
81.0000 mg | DELAYED_RELEASE_TABLET | Freq: Every day | ORAL | Status: DC
  Administered 2019-01-16 – 2019-01-18 (×3): 81 mg via ORAL
  Filled 2019-01-15 (×6): qty 1

## 2019-01-15 MED ORDER — TRIAMTERENE-HCTZ 37.5-25 MG PO TABS
1.0000 | ORAL_TABLET | Freq: Every day | ORAL | Status: DC
  Administered 2019-01-16 – 2019-01-18 (×3): 1 via ORAL
  Filled 2019-01-15 (×3): qty 1

## 2019-01-15 MED ORDER — ALPRAZOLAM 0.5 MG PO TABS
0.5000 mg | ORAL_TABLET | Freq: Every day | ORAL | Status: DC | PRN
  Administered 2019-01-16 – 2019-01-17 (×3): 0.5 mg via ORAL
  Filled 2019-01-15 (×3): qty 1

## 2019-01-15 MED ORDER — POTASSIUM CHLORIDE IN NACL 20-0.45 MEQ/L-% IV SOLN
INTRAVENOUS | Status: DC
  Administered 2019-01-15 – 2019-01-16 (×2): via INTRAVENOUS
  Filled 2019-01-15 (×3): qty 1000

## 2019-01-15 MED ORDER — METHYLPREDNISOLONE SODIUM SUCC 40 MG IJ SOLR
40.0000 mg | Freq: Four times a day (QID) | INTRAMUSCULAR | Status: DC
  Administered 2019-01-15: 40 mg via INTRAVENOUS
  Filled 2019-01-15: qty 1

## 2019-01-15 MED ORDER — ACETAMINOPHEN 650 MG RE SUPP: 650.0000 mg | Freq: Four times a day (QID) | RECTAL | Status: DC | PRN

## 2019-01-15 MED ORDER — ONDANSETRON HCL 4 MG/2ML IJ SOLN
4.0000 mg | Freq: Four times a day (QID) | INTRAMUSCULAR | Status: DC | PRN
  Administered 2019-01-15: 4 mg via INTRAVENOUS
  Filled 2019-01-15: qty 2

## 2019-01-15 MED ORDER — METOPROLOL TARTRATE 5 MG/5ML IV SOLN
5.0000 mg | Freq: Once | INTRAVENOUS | Status: AC
  Administered 2019-01-15: 5 mg via INTRAVENOUS
  Filled 2019-01-15: qty 5

## 2019-01-15 MED ORDER — PREDNISONE 20 MG PO TABS: 40.0000 mg | ORAL_TABLET | Freq: Every day | ORAL | Status: DC

## 2019-01-15 MED ORDER — SODIUM CHLORIDE 0.9 % IV BOLUS
1000.0000 mL | Freq: Once | INTRAVENOUS | Status: AC
  Administered 2019-01-15: 1000 mL via INTRAVENOUS

## 2019-01-15 MED ORDER — ALBUTEROL SULFATE (2.5 MG/3ML) 0.083% IN NEBU
2.5000 mg | INHALATION_SOLUTION | Freq: Once | RESPIRATORY_TRACT | Status: AC
  Administered 2019-01-15: 2.5 mg via RESPIRATORY_TRACT

## 2019-01-15 MED ORDER — ACETAMINOPHEN 325 MG PO TABS
650.0000 mg | ORAL_TABLET | Freq: Four times a day (QID) | ORAL | Status: DC | PRN
  Administered 2019-01-15: 650 mg via ORAL
  Filled 2019-01-15: qty 2

## 2019-01-15 MED ORDER — IOPAMIDOL (ISOVUE-370) INJECTION 76%
100.0000 mL | Freq: Once | INTRAVENOUS | Status: AC | PRN
  Administered 2019-01-15: 100 mL via INTRAVENOUS

## 2019-01-15 MED ORDER — METOPROLOL SUCCINATE ER 25 MG PO TB24
25.0000 mg | ORAL_TABLET | Freq: Every day | ORAL | Status: DC
  Filled 2019-01-15 (×2): qty 1

## 2019-01-15 MED ORDER — LEVOFLOXACIN IN D5W 750 MG/150ML IV SOLN
750.0000 mg | INTRAVENOUS | Status: DC
  Administered 2019-01-15 – 2019-01-16 (×2): 750 mg via INTRAVENOUS
  Filled 2019-01-15 (×2): qty 150

## 2019-01-15 MED ORDER — ONDANSETRON HCL 4 MG PO TABS: 4.0000 mg | ORAL_TABLET | Freq: Four times a day (QID) | ORAL | Status: DC | PRN

## 2019-01-15 MED ORDER — MAGNESIUM SULFATE 2 GM/50ML IV SOLN
2.0000 g | Freq: Once | INTRAVENOUS | Status: AC
  Administered 2019-01-15: 2 g via INTRAVENOUS
  Filled 2019-01-15: qty 50

## 2019-01-15 MED ORDER — POTASSIUM CHLORIDE CRYS ER 20 MEQ PO TBCR
20.0000 meq | EXTENDED_RELEASE_TABLET | Freq: Once | ORAL | Status: DC
  Filled 2019-01-15: qty 1

## 2019-01-15 MED ORDER — EZETIMIBE 10 MG PO TABS
10.0000 mg | ORAL_TABLET | Freq: Every day | ORAL | Status: DC
  Administered 2019-01-15 – 2019-01-18 (×4): 10 mg via ORAL
  Filled 2019-01-15 (×4): qty 1

## 2019-01-15 MED ORDER — IOPAMIDOL (ISOVUE-370) INJECTION 76%
INTRAVENOUS | Status: AC
  Filled 2019-01-15: qty 100

## 2019-01-15 MED ORDER — LOSARTAN POTASSIUM 50 MG PO TABS
100.0000 mg | ORAL_TABLET | Freq: Every day | ORAL | Status: DC
  Administered 2019-01-15 – 2019-01-18 (×4): 100 mg via ORAL
  Filled 2019-01-15 (×4): qty 2

## 2019-01-15 MED ORDER — ROSUVASTATIN CALCIUM 20 MG PO TABS
20.0000 mg | ORAL_TABLET | Freq: Every day | ORAL | Status: DC
  Administered 2019-01-15 – 2019-01-17 (×3): 20 mg via ORAL
  Filled 2019-01-15 (×3): qty 1

## 2019-01-15 MED ORDER — LORAZEPAM 2 MG/ML IJ SOLN
1.0000 mg | Freq: Four times a day (QID) | INTRAMUSCULAR | Status: AC | PRN
  Administered 2019-01-15 – 2019-01-16 (×3): 1 mg via INTRAVENOUS
  Filled 2019-01-15 (×3): qty 1

## 2019-01-15 MED ORDER — SODIUM CHLORIDE 0.9 % IV SOLN
INTRAVENOUS | Status: DC | PRN
  Administered 2019-01-15: 500 mL via INTRAVENOUS

## 2019-01-15 MED ORDER — METHYLPREDNISOLONE SODIUM SUCC 125 MG IJ SOLR
60.0000 mg | Freq: Four times a day (QID) | INTRAMUSCULAR | Status: DC
  Administered 2019-01-15 – 2019-01-16 (×4): 60 mg via INTRAVENOUS
  Filled 2019-01-15 (×4): qty 2

## 2019-01-15 MED ORDER — IPRATROPIUM-ALBUTEROL 0.5-2.5 (3) MG/3ML IN SOLN
3.0000 mL | Freq: Once | RESPIRATORY_TRACT | Status: AC
  Administered 2019-01-15: 3 mL via RESPIRATORY_TRACT
  Filled 2019-01-15: qty 3

## 2019-01-15 MED ORDER — IPRATROPIUM BROMIDE 0.02 % IN SOLN
0.5000 mg | Freq: Once | RESPIRATORY_TRACT | Status: AC
  Administered 2019-01-15: 0.5 mg via RESPIRATORY_TRACT
  Filled 2019-01-15: qty 2.5

## 2019-01-15 MED ORDER — SODIUM CHLORIDE (PF) 0.9 % IJ SOLN
INTRAMUSCULAR | Status: AC
  Filled 2019-01-15: qty 50

## 2019-01-15 MED ORDER — BUPROPION HCL ER (XL) 300 MG PO TB24
300.0000 mg | ORAL_TABLET | Freq: Every day | ORAL | Status: DC
  Administered 2019-01-15 – 2019-01-18 (×4): 300 mg via ORAL
  Filled 2019-01-15 (×4): qty 1

## 2019-01-15 NOTE — ED Notes (Signed)
ED TO INPATIENT HANDOFF REPORT  Name/Age/Gender [name] 58 y.o. female  Code Status    Code Status Orders  (From admission, onward)         Start     Ordered   01/15/19 0830  Full code  Continuous     01/15/19 0833        Code Status History    Date Active Date Inactive Code Status Order ID Comments User Context   01/15/2019 0829 01/15/2019 0833 Full Code [id number]  Reubin Milan, MD ED   06/18/2018 1423 06/20/2018 1508 Full Code [id number]  Greta Doom, MD ED   06/30/2017 1832 07/01/2017 1418 Full Code [id number]  Julianne Rice, MD ED   02/09/2017 1238 02/12/2017 1728 Full Code [id number]  Kristeen Miss, MD Inpatient   02/09/2017 1238 02/09/2017 1238 Full Code [id number]  Kristeen Miss, MD Inpatient      Home/SNF/Other Home  Chief Complaint shortness of breath   Level of Care/Admitting Diagnosis ED Disposition    ED Disposition Condition Center: [facility] [100102]  Level of Care: Stepdown [14]  Admit to SDU based on following criteria: Respiratory Distress:  Frequent assessment and/or intervention to maintain adequate ventilation/respiration, pulmonary toilet, and respiratory treatment.  Diagnosis: Acute respiratory failure with hypoxemia [facility]) [[id number]]  Admitting Physician: Reubin Milan [[id number]]  Attending Physician: Reubin Milan [[id number]]  PT Class (Do Not Modify): Observation [104]  PT Acc Code (Do Not Modify): Observation [10022]       Medical History Past Medical History:  Diagnosis Date  . Adenomatous polyps   . Anemia   . Blood transfusion without reported diagnosis 01/1999   following hysterectomy  . Bulging discs   . Endometriosis   . Hyperlipidemia   . Hypertension   . PVC (premature ventricular contraction)   . Urinary incontinence     Allergies Allergies  Allergen Reactions  . Ancef [Cefazolin]     Cause rash shortness of breath and vomiting   . Ceftin  [Cefuroxime] Nausea And Vomiting  . Penicillins Hives    Has patient had a PCN reaction causing immediate rash, facial/tongue/throat swelling, SOB or lightheadedness with hypotension: Yes Has patient had a PCN reaction causing severe rash involving mucus membranes or skin necrosis:Yes Has patient had a PCN reaction that required hospitalization:No Has patient had a PCN reaction occurring within the last 10 years: No If all of the above answers are "NO", then may proceed with Cephalosporin use.     IV Location/Drains/Wounds Patient Lines/Drains/Airways Status   Active Line/Drains/Airways    Name:   Placement date:   Placement time:   Site:   Days:   Peripheral IV 01/15/19 Left Antecubital   01/15/19    -    Antecubital   less than 1   Incision (Closed) 02/09/17 Back Other (Comment)   02/09/17    1053     705          Labs/Imaging Results for orders placed or performed during the hospital encounter of 01/15/19 (from the past 48 hour(s))  CBC with Differential     Status: Abnormal   Collection Time: 01/15/19  4:54 AM  Result Value Ref Range   WBC 11.7 (H) 4.0 - 10.5 K/uL   RBC 3.75 (L) 3.87 - 5.11 MIL/uL   Hemoglobin 12.2 12.0 - 15.0 g/dL   HCT 37.8 36.0 - 46.0 %   MCV 100.8 (H) 80.0 - 100.0 fL   MCH  32.5 26.0 - 34.0 pg   MCHC 32.3 30.0 - 36.0 g/dL   RDW 13.1 11.5 - 15.5 %   Platelets 283 150 - 400 K/uL   nRBC 0.0 0.0 - 0.2 %   Neutrophils Relative % 92 %   Neutro Abs 10.7 (H) 1.7 - 7.7 K/uL   Lymphocytes Relative 5 %   Lymphs Abs 0.6 (L) 0.7 - 4.0 K/uL   Monocytes Relative 3 %   Monocytes Absolute 0.3 0.1 - 1.0 K/uL   Eosinophils Relative 0 %   Eosinophils Absolute 0.0 0.0 - 0.5 K/uL   Basophils Relative 0 %   Basophils Absolute 0.0 0.0 - 0.1 K/uL   Immature Granulocytes 0 %   Abs Immature Granulocytes 0.05 0.00 - 0.07 K/uL    Comment: Performed at [facility], [place] [address], [place], [place] [address]  Comprehensive metabolic panel     Status:  Abnormal   Collection Time: 01/15/19  4:54 AM  Result Value Ref Range   Sodium 142 135 - 145 mmol/L   Potassium 3.5 3.5 - 5.1 mmol/L   Chloride 104 98 - 111 mmol/L   CO2 22 22 - 32 mmol/L   Glucose, Bld 169 (H) 70 - 99 mg/dL   BUN 21 (H) 6 - 20 mg/dL   Creatinine, Ser 0.97 0.44 - 1.00 mg/dL   Calcium 9.4 8.9 - 10.3 mg/dL   Total Protein 8.3 (H) 6.5 - 8.1 g/dL   Albumin 4.9 3.5 - 5.0 g/dL   AST 25 15 - 41 U/L   ALT 21 0 - 44 U/L   Alkaline Phosphatase 71 38 - 126 U/L   Total Bilirubin 0.9 0.3 - 1.2 mg/dL   GFR calc non Af Amer >60 >60 mL/min   GFR calc Af Amer >60 >60 mL/min   Anion gap 16 (H) 5 - 15    Comment: Performed at [facility], [place] [address] [place], [place] [address]  Brain natriuretic peptide     Status: None   Collection Time: 01/15/19  4:54 AM  Result Value Ref Range   B Natriuretic Peptide 24.5 0.0 - 100.0 pg/mL    Comment: Performed at [facility], [place] [address], [place], [place] [address]  Phosphorus     Status: None   Collection Time: 01/15/19  4:54 AM  Result Value Ref Range   Phosphorus 3.5 2.5 - 4.6 mg/dL    Comment: Performed at [facility], [place] [address], [place], [place] [address]  Magnesium     Status: None   Collection Time: 01/15/19  4:54 AM  Result Value Ref Range   Magnesium 2.3 1.7 - 2.4 mg/dL    Comment: Performed at [facility], [place] [address] [place], [place] [address]  I-Stat Troponin, ED (not at [facility])     Status: None   Collection Time: 01/15/19  4:58 AM  Result Value Ref Range   Troponin i, poc 0.00 0.00 - 0.08 ng/mL   Comment 3            Comment: Due to the release kinetics of cTnI, a negative result within the first hours of the onset of symptoms does not rule out myocardial infarction with certainty. If myocardial infarction is still suspected, repeat the test at appropriate intervals.   Blood gas, venous     Status: Abnormal   Collection Time:  01/15/19  5:08 AM  Result Value Ref Range   O2 Content 2.0  L/min   Delivery systems NASAL CANNULA    pH, Ven 7.371 7.250 - 7.430   pCO2, Ven 42.4 (L) 44.0 - 60.0 mmHg   pO2, Ven 58.0 (H) 32.0 - 45.0 mmHg   Bicarbonate 24.0 20.0 - 28.0 mmol/L   Acid-base deficit 0.9 0.0 - 2.0 mmol/L   O2 Saturation 88.2 %   Patient temperature 98.6    Collection site VEIN    Drawn by [phone]    Sample type VENOUS     Comment: Performed at [facility], [place] [address], [place], [place] [address]  Influenza panel by PCR (type A & B)     Status: None   Collection Time: 01/15/19  6:11 AM  Result Value Ref Range   Influenza A By PCR NEGATIVE NEGATIVE   Influenza B By PCR NEGATIVE NEGATIVE    Comment: (NOTE) The Xpert Xpress Flu assay is intended as an aid in the diagnosis of  influenza and should not be used as a sole basis for treatment.  This  assay is FDA approved for nasopharyngeal swab specimens only. Nasal  washings and aspirates are unacceptable for Xpert Xpress Flu testing. Performed at [facility], [place] [address], [place], [place] [address]    Dg Chest 2 View  Result Date: 01/15/2019 CLINICAL DATA:  Shortness of breath with wheezing EXAM: CHEST - 2 VIEW COMPARISON:  06/18/2018 FINDINGS: The heart size and mediastinal contours are within normal limits. Both lungs are clear. Mild degenerative changes of the spine. IMPRESSION: No active cardiopulmonary disease. Electronically Signed   By: Donavan Foil M.D.   On: 01/15/2019 02:09   Ct Angio Chest Pe W And/or Wo Contrast  Result Date: 01/15/2019 CLINICAL DATA:  Shortness of breath EXAM: CT ANGIOGRAPHY CHEST WITH CONTRAST TECHNIQUE: Multidetector CT imaging of the chest was performed using the standard protocol during bolus administration of intravenous contrast. Multiplanar CT image reconstructions and MIPs were obtained to evaluate the vascular anatomy. CONTRAST:  116mL ISOVUE-370 IOPAMIDOL (ISOVUE-370)  INJECTION 76% COMPARISON:  Chest radiograph January 15, 2019 FINDINGS: Cardiovascular: There is no demonstrable pulmonary embolus. There is no thoracic aortic aneurysm or dissection. The visualized great vessels appear unremarkable. There is no pericardial effusion or pericardial thickening. Mediastinum/Nodes: Thyroid appears unremarkable. There is no appreciable thoracic adenopathy. No esophageal lesions are evident. Lungs/Pleura: There is no appreciable edema or consolidation. There is a 3 mm nodular opacity abutting the pleura in the anterior segment right upper lobe seen on axial slice 40 series 6. There is no pleural effusion or pleural thickening evident. Upper Abdomen: Visualized upper abdominal structures appear unremarkable. Musculoskeletal: There are breast implants bilaterally. There are no blastic or lytic bone lesions. No chest wall lesions are evident. Review of the MIP images confirms the above findings. IMPRESSION: 1. No demonstrable pulmonary embolus. No thoracic aortic aneurysm or dissection. 2. 3 mm nodular opacity abutting the pleura in the right upper lobe. No follow-up needed if patient is low-risk. Non-contrast chest CT can be considered in 12 months if patient is high-risk. This recommendation follows the consensus statement: Guidelines for Management of Incidental Pulmonary Nodules Detected on CT Images: From the Fleischner Society 2017; Radiology 2017; 284:228-243. No lung edema or consolidation. 3.  No appreciable thoracic adenopathy. Electronically Signed   By: Lowella Grip III M.D.   On: 01/15/2019 07:57    Pending Labs Unresulted Labs (From admission, onward)    Start     Ordered   01/16/19 0500  CBC WITH DIFFERENTIAL  Daily,   R     01/15/19 0833   01/16/19 0500  Comprehensive metabolic panel  Tomorrow morning,   R     01/15/19 0833   01/16/19 0500  HIV antibody  Tomorrow morning,   R     01/15/19 0833   01/15/19 0818  Respiratory Panel by PCR  (Respiratory virus panel  with precautions)  Once,   R     01/15/19 0817          Vitals/Pain Today's Vitals   01/15/19 0800 01/15/19 0830 01/15/19 0930 01/15/19 1000  BP: (!) 171/117 (!) 163/102 (!) 156/99 (!) 155/105  Pulse: (!) 120 (!) 116 (!) 113 (!) 114  Resp: 20 (!) 24 (!) 23 (!) 25  Temp:      TempSrc:      SpO2: 95% 99% 98% 96%  PainSc:        Isolation Precautions Droplet precaution  Medications Medications  metoprolol succinate (TOPROL-XL) 24 hr tablet 25 mg (25 mg Oral Not Given 01/15/19 1101)  sodium chloride (PF) 0.9 % injection (has no administration in time range)  iopamidol (ISOVUE-370) 76 % injection (has no administration in time range)  potassium chloride SA (K-DUR,KLOR-CON) CR tablet 20 mEq (has no administration in time range)  enoxaparin (LOVENOX) injection 40 mg (40 mg Subcutaneous Not Given 01/15/19 1102)  methylPREDNISolone sodium succinate (SOLU-MEDROL) 40 mg/mL injection 40 mg (40 mg Intravenous Given 01/15/19 0914)    Followed by  predniSONE (DELTASONE) tablet 40 mg (has no administration in time range)  0.45 % NaCl with KCl 20 mEq / L infusion ( Intravenous New Bag/Given 01/15/19 1053)  acetaminophen (TYLENOL) tablet 650 mg (has no administration in time range)    Or  acetaminophen (TYLENOL) suppository 650 mg (has no administration in time range)  ondansetron (ZOFRAN) tablet 4 mg (has no administration in time range)    Or  ondansetron (ZOFRAN) injection 4 mg (has no administration in time range)  0.9 %  sodium chloride infusion (500 mLs Intravenous New Bag/Given 01/15/19 0912)  levofloxacin (LEVAQUIN) IVPB 750 mg (has no administration in time range)  albuterol (PROVENTIL) (2.5 MG/3ML) 0.083% nebulizer solution 5 mg (5 mg Nebulization Given 01/15/19 0216)  albuterol (PROVENTIL) (2.5 MG/3ML) 0.083% nebulizer solution 5 mg (5 mg Nebulization Given 01/15/19 0412)  ipratropium (ATROVENT) nebulizer solution 0.5 mg (0.5 mg Nebulization Given 01/15/19 0412)  sodium chloride 0.9 %  bolus 1,000 mL (0 mLs Intravenous Stopped 01/15/19 0814)  LORazepam (ATIVAN) injection 0.5 mg (0.5 mg Intravenous Given 01/15/19 0513)  iopamidol (ISOVUE-370) 76 % injection 100 mL (100 mLs Intravenous Contrast Given 01/15/19 0732)  ipratropium-albuterol (DUONEB) 0.5-2.5 (3) MG/3ML nebulizer solution 3 mL (3 mLs Nebulization Given 01/15/19 0830)  magnesium sulfate IVPB 2 g 50 mL (2 g Intravenous New Bag/Given 01/15/19 0914)  albuterol (PROVENTIL) (2.5 MG/3ML) 0.083% nebulizer solution 2.5 mg (2.5 mg Nebulization Given 01/15/19 0830)  LORazepam (ATIVAN) injection 1 mg (1 mg Intravenous Given 01/15/19 1054)  metoprolol tartrate (LOPRESSOR) injection 5 mg (5 mg Intravenous Given 01/15/19 1054)    Mobility walks

## 2019-01-15 NOTE — ED Provider Notes (Signed)
Signout from previous provider, Joline Maxcy, PA-C See previous providers note for full H&P  Briefly, patient presents with cough, shortness of breath x 1 week ago.  She was given Z-pack at that time.  Patient was not having any improvement and was given Levaquin for empiric pneumonia.  She did not have a chest x-ray.  In route, patient was wheezing and had rhonchi on exam.  Patient was given Solu-Medrol, magnesium, and 2 breathing treatments.  Patient still having tachypnea.  Plan for admission, however influenza pending to explain symptoms, as chest x-ray is clear.  Patient is on BiPAP.  Patient vapes tobacco and marijuana.  She also reports recent travel to [place] and is on OCP. If flu negative, CT angio chest.  7:17 AM influenza negative.  CT angio chest is pending.  Patient feels much better on BiPAP.  8:20 AM CT angios chest is negative for acute findings, but does show 3 mm pulmonary nodule in the right upper lobe, noncontrast chest CT considered in 12 months if patient is high risk, which she is.  No edema or consolidation noted.  No PE. Patient feels like her wheezing has returned.  Wheezing and rhonchi heard on auscultation, worse in the right.  Will repeat nebulizer treatment. I discussed patient case with hospitalist, Dr. Olevia Bowens, who accepts patient for admission, but requests consult by pulmonology. Consult pending.  8:27 AM I spoke with Dr. Ander Slade with pulmonary medicine who will evaluate the patient and consult.     Frederica Kuster, PA-C 01/15/19 [place], [place], MD 01/16/19 [phone]

## 2019-01-15 NOTE — H&P (Signed)
History and Physical    [name] EUM:[id number] DOB: [birth year] DOA: 01/15/2019  PCP: Derinda Late, MD   Patient coming from: Home.  I have personally briefly reviewed patient's old medical records in [place]  Chief Complaint: Shortness of breath.  HPI: [name] is a 58 y.o. female with medical history significant of adenomatous polyps, anemia, endometriosis, history of blood loss anemia following hysterectomy, bulging discs, hyperlipidemia, hypertension, urinary incontinence, history of PVCs who is coming to the emergency department with complaints of progressively worse shortness of breath since yesterday evening.  She states that she started having URI symptoms about a week ago and was diagnosed with pneumonia.  However, a chest radiograph was now done.  She was started on azithromycin and albuterol.    She followed up yesterday due to lack of improvement and was started on Levaquin.  She was told to continue her home albuterol.  However, later in the evening she became very dyspneic with nonproductive cough, wheezing and fatigue.  She denies fever, chills, myalgia mild sore throat and rhinorrhea a week earlier.  No hemoptysis, chest pain, palpitations, dizziness, diaphoresis, PND, orthopnea or pitting edema of the lower extremities.  She denies diarrhea or constipation.  She denies dysuria, frequency or hematuria.  No polyuria, polydipsia, polyphagia or blurred vision.  ED Course: Initial vital signs temperature 98.3 F, pulse 125, respirations 23, blood pressure 156/102 mmHg and O2 sat 100% on nasal cannula oxygen.  The patient received supplemental oxygen, and 1000 mL NS bolus, albuterol 2.5 neb x1, albuterol 5 mg neb x1, and DuoNeb x1.  She is given lorazepam 0.5 mg IV x1.  White count was 11.7, hemoglobin 12.2 g/dL and platelets 283.  Troponin was normal.  BNP was normal.  Flames IAM B by PCR was negative.  Her venous blood gas showed decreased PCO2 and PO2, but  otherwise normal.  CMP shows a glucose of 169 and BUN was 21 mg/dL.  Total protein 8.3 g/dL.  Anion gap was 16.  All other values are within normal limits.  Imaging: her chest radiograph did not show any acute cardiopulmonary pathology.  CTA chest did not show PE, aortic aneurysm or dissection.  There was a 3 mm nodular opacity obviating the pleura in the right upper lobe.  Follow-up recommended for high risk patients.  Review of Systems: As per HPI otherwise 10 point review of systems negative.  Past Medical History:  Diagnosis Date  . Adenomatous polyps   . Anemia   . Blood transfusion without reported diagnosis 01/1999   following hysterectomy  . Bulging discs   . Endometriosis   . Hyperlipidemia   . Hypertension   . PVC (premature ventricular contraction)   . Urinary incontinence     Past Surgical History:  Procedure Laterality Date  . 5 back surgeries    . ABDOMINAL HYSTERECTOMY  2001   TAH/RSO  . AUGMENTATION MAMMAPLASTY Bilateral 2013   saline. pre pectoral  . BLADDER SUSPENSION  2003   [facility]O and [facility]cele repair [facility]  . BREAST SURGERY     breast augmentation--saline implants  . HERNIA REPAIR    . LUMBAR FUSION  2010   Dr. Carloyn Manner  . REDUCTION MAMMAPLASTY Bilateral   . SPINE SURGERY       reports that she has quit smoking. Her smoking use included cigarettes. She has never used smokeless tobacco. She reports current alcohol use of about 6.0 - 8.0 standard drinks of alcohol per week. She reports current drug  use. Drug: Marijuana.  Allergies  Allergen Reactions  . Ancef [Cefazolin]     Cause rash shortness of breath and vomiting   . Ceftin [Cefuroxime] Nausea And Vomiting  . Penicillins Hives    Has patient had a PCN reaction causing immediate rash, facial/tongue/throat swelling, SOB or lightheadedness with hypotension: Yes Has patient had a PCN reaction causing severe rash involving mucus membranes or skin necrosis:Yes Has patient had a PCN reaction that required  hospitalization:No Has patient had a PCN reaction occurring within the last 10 years: No If all of the above answers are "NO", then may proceed with Cephalosporin use.     Family History  Problem Relation Age of Onset  . Hypertension Mother   . Hypertension Father   . Cancer Father   . Hyperlipidemia Sister   . Hypertension Sister   . Hyperlipidemia Brother   . Hypertension Brother   . Hypertension Brother   . Hypertension Brother   . Cancer Maternal Uncle 72       colon ca  . Stroke Maternal Uncle   . Breast cancer Maternal Grandmother   . Cancer Maternal Grandmother 44       colon ca  . Stroke Maternal Aunt   . Stroke Paternal Grandfather    Prior to Admission medications   Medication Sig Start Date End Date Taking? Authorizing Provider  ALPRAZolam Duanne Moron) 0.5 MG tablet Take 0.5 mg by mouth daily as needed for anxiety. for anxiety 06/11/18  Yes [provider]  aspirin EC 81 MG EC tablet Take 1 tablet (81 mg total) by mouth daily. 06/20/18  Yes Rinehuls, Early Chars, PA-C  buPROPion (WELLBUTRIN XL) 300 MG 24 hr tablet Take 300 mg by mouth daily.   Yes [provider]  ezetimibe (ZETIA) 10 MG tablet Take 1 tablet (10 mg total) by mouth daily. 08/28/18  Yes Venancio Poisson, NP  levofloxacin (LEVAQUIN) 500 MG tablet Take 500 mg by mouth daily.   Yes [provider]  losartan (COZAAR) 100 MG tablet Take 100 mg by mouth daily.    Yes [provider]  metoprolol succinate (TOPROL-XL) 25 MG 24 hr tablet Take 25 mg by mouth daily.   Yes [provider]  rosuvastatin (CRESTOR) 20 MG tablet Take 1 tablet (20 mg total) by mouth daily at 6 PM. 06/20/18  Yes Rinehuls, Early Chars, PA-C  triamterene-hydrochlorothiazide (MAXZIDE-25) 37.5-25 MG tablet Take 1 tablet by mouth daily.   Yes [provider]  zolpidem (AMBIEN) 10 MG tablet Take 10 mg by mouth at bedtime as needed for sleep.   Yes [provider]    Physical Exam: Vitals:    01/15/19 0700 01/15/19 0735 01/15/19 0800 01/15/19 0830  BP: (!) 161/102  (!) 171/117 (!) 163/102  Pulse: (!) 108  (!) 120 (!) 116  Resp: (!) 29  20 (!) 24  Temp:      TempSrc:      SpO2: 100% 100% 95% 99%    Constitutional: Looks acutely ill.  Anxious. Eyes: PERRL, lids and conjunctivae normal ENMT: BiPAP mask on.  Mucous membranes are mildly dry. Neck: Normal, supple, no masses, no thyromegaly Respiratory: Decreased breath sounds with bilateral rhonchi and wheezing, no crackles. Normal respiratory effort. No accessory muscle use.  Cardiovascular: Tachycardic at 112 bpm, no murmurs / rubs / gallops. No extremity edema. 2+ pedal pulses. No carotid bruits.  Abdomen: Soft, no tenderness, no masses palpated. No hepatosplenomegaly. Bowel sounds positive.  Musculoskeletal: no clubbing / cyanosis.  Good  ROM, no contractures. Normal muscle tone.  Skin: no rashes, lesions, ulcers. No induration on limited dermatological examination. Neurologic: CN 2-12 grossly intact. Sensation intact, DTR normal. Strength 5/5 in all 4.  Psychiatric: Normal judgment and insight. Alert and oriented x 4.  Anxious mood.   Labs on Admission: I have personally reviewed following labs and imaging studies  CBC: Recent Labs  Lab 01/15/19 0454  WBC 11.7*  NEUTROABS 10.7*  HGB 12.2  HCT 37.8  MCV 100.8*  PLT 170   Basic Metabolic Panel: Recent Labs  Lab 01/15/19 0454  NA 142  K 3.5  CL 104  CO2 22  GLUCOSE 169*  BUN 21*  CREATININE 0.97  CALCIUM 9.4  MG 2.3  PHOS 3.5   GFR: CrCl cannot be calculated (Unknown ideal weight.). Liver Function Tests: Recent Labs  Lab 01/15/19 0454  AST 25  ALT 21  ALKPHOS 71  BILITOT 0.9  PROT 8.3*  ALBUMIN 4.9   No results for input(s): LIPASE, AMYLASE in the last 168 hours. No results for input(s): AMMONIA in the last 168 hours. Coagulation Profile: No results for input(s): INR, PROTIME in the last 168 hours. Cardiac Enzymes: No results for input(s):  CKTOTAL, CKMB, CKMBINDEX, TROPONINI in the last 168 hours. BNP (last 3 results) No results for input(s): PROBNP in the last 8760 hours. HbA1C: No results for input(s): HGBA1C in the last 72 hours. CBG: No results for input(s): GLUCAP in the last 168 hours. Lipid Profile: No results for input(s): CHOL, HDL, LDLCALC, TRIG, CHOLHDL, LDLDIRECT in the last 72 hours. Thyroid Function Tests: No results for input(s): TSH, T4TOTAL, FREET4, T3FREE, THYROIDAB in the last 72 hours. Anemia Panel: No results for input(s): VITAMINB12, FOLATE, FERRITIN, TIBC, IRON, RETICCTPCT in the last 72 hours. Urine analysis:    Component Value Date/Time   COLORURINE YELLOW 09/23/2008 1004   APPEARANCEUR CLEAR 09/23/2008 1004   LABSPEC 1.028 09/23/2008 1004   PHURINE 5.0 09/23/2008 1004   GLUCOSEU NEGATIVE 09/23/2008 1004   HGBUR NEGATIVE 09/23/2008 1004   BILIRUBINUR N 04/27/2013 [place] 09/23/2008 1004   PROTEINUR N 04/27/2013 0944   PROTEINUR NEGATIVE 09/23/2008 1004   UROBILINOGEN negative 04/27/2013 0944   UROBILINOGEN 0.2 09/23/2008 1004   NITRITE N 04/27/2013 0944   NITRITE NEGATIVE 09/23/2008 1004   LEUKOCYTESUR Negative 04/27/2013 0944    Radiological Exams on Admission: Dg Chest 2 View  Result Date: 01/15/2019 CLINICAL DATA:  Shortness of breath with wheezing EXAM: CHEST - 2 VIEW COMPARISON:  06/18/2018 FINDINGS: The heart size and mediastinal contours are within normal limits. Both lungs are clear. Mild degenerative changes of the spine. IMPRESSION: No active cardiopulmonary disease. Electronically Signed   By: Donavan Foil M.D.   On: 01/15/2019 02:09   Ct Angio Chest Pe W And/or Wo Contrast  Result Date: 01/15/2019 CLINICAL DATA:  Shortness of breath EXAM: CT ANGIOGRAPHY CHEST WITH CONTRAST TECHNIQUE: Multidetector CT imaging of the chest was performed using the standard protocol during bolus administration of intravenous contrast. Multiplanar CT image reconstructions and  MIPs were obtained to evaluate the vascular anatomy. CONTRAST:  158mL ISOVUE-370 IOPAMIDOL (ISOVUE-370) INJECTION 76% COMPARISON:  Chest radiograph January 15, 2019 FINDINGS: Cardiovascular: There is no demonstrable pulmonary embolus. There is no thoracic aortic aneurysm or dissection. The visualized great vessels appear unremarkable. There is no pericardial effusion or pericardial thickening. Mediastinum/Nodes: Thyroid appears unremarkable. There is no appreciable thoracic adenopathy. No esophageal lesions are evident. Lungs/Pleura: There is no appreciable edema or consolidation. There  is a 3 mm nodular opacity abutting the pleura in the anterior segment right upper lobe seen on axial slice 40 series 6. There is no pleural effusion or pleural thickening evident. Upper Abdomen: Visualized upper abdominal structures appear unremarkable. Musculoskeletal: There are breast implants bilaterally. There are no blastic or lytic bone lesions. No chest wall lesions are evident. Review of the MIP images confirms the above findings. IMPRESSION: 1. No demonstrable pulmonary embolus. No thoracic aortic aneurysm or dissection. 2. 3 mm nodular opacity abutting the pleura in the right upper lobe. No follow-up needed if patient is low-risk. Non-contrast chest CT can be considered in 12 months if patient is high-risk. This recommendation follows the consensus statement: Guidelines for Management of Incidental Pulmonary Nodules Detected on CT Images: From the Fleischner Society 2017; Radiology 2017; 284:228-243. No lung edema or consolidation. 3.  No appreciable thoracic adenopathy. Electronically Signed   By: Lowella Grip III M.D.   On: 01/15/2019 07:57    EKG: Independently reviewed. Vent. 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 Continue supplemental oxygen. Continue BiPAP ventilation as needed. Continue Solu-Medrol 40 mg IVP every 6 hours x 4 doses. Switch to oral prednisone tomorrow morning. Continue with scheduled and as needed bronchodilators. Levaquin 750 mg IVPB every 24 hours x3 days. Pulmonology consult appreciated. The patient was advised by pulmonology to cease use of vaping products.  Active Problems:   HTN (hypertension) Continue losartan 100 mg p.o. daily. Continue Toprol-XL 25 mg p.o. daily. Resume Maxide tomorrow morning. Monitor BP, HR, renal function and electrolytes.    Depression Continue Wellbutrin XL 300 mg p.o. daily. Continue alprazolam 0.5 mg p.o daily. as needed.    Dyslipidemia Continue rosuvastatin 20 mg p.o. daily. Monitor LFTs. Fasting lipid profile follow-up as an outpatient.    Hyperglycemia Monitor blood glucose. Check hemoglobin A1c if hyperglycemia is persistent. Carbohydrate modified diet while on glucocorticoids.    Pulmonary nodule Will need follow-up CT in 12 months.    DVT prophylaxis: Lovenox SQ. Code Status: Full code. Family Communication:  Disposition Plan: Admit to stepdown for treatment and close monitoring. Consults called: Pulmonology was contacted by the EDP and will evaluate the patient. Admission status: Observation/stepdown.   Reubin Milan MD [facility]  01/15/2019, 10:01 AM

## 2019-01-15 NOTE — Progress Notes (Signed)
Ativan IV for anxiety  We will continue on Solu-Medrol IV till she turns around  Continue bronchodilator treatments

## 2019-01-15 NOTE — Progress Notes (Signed)
Pt. Off Bipap for approximately 15 minutes. She became too labored and had to go back on BIPAP. Will continue to monitor closely.

## 2019-01-15 NOTE — Progress Notes (Signed)
  Echocardiogram 2D Echocardiogram has been performed.  Nicholaus Steinke G Jinx Gilden 01/15/2019, 1:47 PM

## 2019-01-15 NOTE — ED Triage Notes (Signed)
Patient arrives by [facility] with complaints of shortness of breath-onset 5 hours ago and used albuterol inhaler with no relief-EMS reports wheezing all fields, rhonchi bases-EMS states patient has had flu type symptoms over the last few days and is currently on a z-pack. EMS reports administered albuterol 5 mg and Atrovent 0.5 mg, Solumedrol 125 mg IV and Magnesium 2 grams. Patient reports some relief after EMS treatment.

## 2019-01-15 NOTE — Consult Note (Addendum)
NAME:  [name], MRN:  [id number], DOB:  12/02/1961, LOS: 0 ADMISSION DATE:  01/15/2019, CONSULTATION DATE: 01/15/2019 REFERRING MD: ED physician, CHIEF COMPLAINT: Shortness of breath  Brief History   Patient has been sick for about a week and a half with a respiratory complaint of shortness of breath, wheezing, cough productive of sputum Quit smoking about 9 years ago Uses vape-last use about 2 days ago Symptoms did not improve with course of antibiotics On second course of antibiotics Presented to the hospital because she could not catch her breath at all  History of present illness   Denies history of underlying lung disease Was recently treated with a course of azithromycin and albuterol which did not relieve symptoms Started on Levaquin-is only used 2 doses Progressive symptoms led to presentation She has had bouts with bronchitis in the past with use of inhalers-has no diagnosis of asthma or any other lung disease Underlying history significant for hypertension, hyperlipidemia, endometriosis, anxiety She denies any chest pains or chest discomfort, no fevers, no chills, denies any GI symptoms She did get some relief following being picked up by EMS but symptoms worsened until she got to the hospital and now on BiPAP She has vape for about a year, commercially produced, up to 8 times a day, nicotine and marijuana Past Medical History   Past Medical History:  Diagnosis Date  . Adenomatous polyps   . Anemia   . Blood transfusion without reported diagnosis 01/1999   following hysterectomy  . Bulging discs   . Endometriosis   . Hyperlipidemia   . Hypertension   . PVC (premature ventricular contraction)   . Urinary incontinence      Significant Hospital Events   Currently on BiPAP-feels a little bit better, not as wheezy, still short of breath  Consults:  Pulmonary 01/15/2019  Procedures:  None  Significant Diagnostic Tests:  CT scan of the chest--reviewed by  myself  Radiological IMPRESSION: 1. No demonstrable pulmonary embolus. No thoracic aortic aneurysm or dissection.  2. 3 mm nodular opacity abutting the pleura in the right upper lobe. No follow-up needed if patient is low-risk. Non-contrast chest CT can be considered in 12 months if patient is high-risk.  3.  No appreciable thoracic adenopathy.  Micro Data:  Respiratory viral panel 01/15/2019-pending Influenza PCR negative  Antimicrobials:  Levaquin 1/21>>  Interim history/subjective:  More comfortable BiPAP on Still short of breath but improving  Objective   Blood pressure (!) 163/102, pulse (!) 116, temperature 98.3 F (36.8 C), temperature source Oral, resp. rate (!) 24, SpO2 99 %.    FiO2 (%):  [30 %] 30 %   Intake/Output Summary (Last 24 hours) at 01/15/2019 1011 Last data filed at 01/15/2019 0814 Gross per 24 hour  Intake 1000 ml  Output -  Net 1000 ml   There were no vitals filed for this visit.  Examination: General: Awake and alert, mild shortness of breath HENT: On BiPAP at present Lungs: Fair air entry bilaterally, no wheezes, no rales Cardiovascular: S1-S2 appreciated, tachycardic Abdomen: Bowel sounds appreciated Extremities: No clubbing, no edema Neuro: Alert and oriented x3, moving all extremities  Resolved Hospital Problem list     Assessment & Plan:  Hypoxemic respiratory failure -We will continue BiPAP -Continue steroids -Bronchodilators -We will attempt to wean off BiPAP and managed with oxygen supplementation  Failed outpatient treatment for pneumonia -Completed course of azithromycin -On Levaquin at present-we will complete course-ordered 3 more days of Levaquin 750 IV  Bronchospasm -Continue  bronchodilators -Continue steroids -The plan will be to wean steroids off by 5 to 7 days  Exposure to [place] about risks of continuing to vape -She is 270% certain that she will quit  Concern for EVALI -Acute hypoxemic  respiratory failure -No infiltrative process noted on CT scan of the chest  Hypertension  Lung nodule on CT scan -This is a very small nodule -Been a recent active smoker and vaping use, will recommend a follow-up CT in 12 months -No pulmonary embolism on CT  EVALI is usually associated with an infiltrative process which we do not have in this situation Symptoms did begin with what sounded like a respiratory infection with cough, productive of sputum which has become dry.  Airway hyperresponsiveness/bronchospasm may have led to presentation It is less likely that she had an infiltrative process that has cleared acutely.  She has been sick for a few days as well and so less likely that the process is just starting-reason for not seeing an infiltrative process.  We will continue to monitor closely Follow radiological changes  May be admitted to a stepdown unit  Best practice:  Diet: Regular diet as tolerated Pain/Anxiety/Delirium protocol (if indicated): Lorazepam for anxiety VAP protocol (if indicated):  DVT prophylaxis: Enoxaparin GI prophylaxis:  Glucose control:  Mobility: As tolerated, bedrest at present Code Status: Full code Family Communication: No family best Disposition: Patient will be admitted to stepdown unit to continue to wean off BiPAP as able  Labs   CBC: Recent Labs  Lab 01/15/19 0454  WBC 11.7*  NEUTROABS 10.7*  HGB 12.2  HCT 37.8  MCV 100.8*  PLT 786    Basic Metabolic Panel: Recent Labs  Lab 01/15/19 0454  NA 142  K 3.5  CL 104  CO2 22  GLUCOSE 169*  BUN 21*  CREATININE 0.97  CALCIUM 9.4  MG 2.3  PHOS 3.5   GFR: CrCl cannot be calculated (Unknown ideal weight.). Recent Labs  Lab 01/15/19 0454  WBC 11.7*    Liver Function Tests: Recent Labs  Lab 01/15/19 0454  AST 25  ALT 21  ALKPHOS 71  BILITOT 0.9  PROT 8.3*  ALBUMIN 4.9   ABG    Component Value Date/Time   HCO3 24.0 01/15/2019 0508   TCO2 25 06/18/2018 1359    ACIDBASEDEF 0.9 01/15/2019 0508   O2SAT 88.2 01/15/2019 0508    ABG: 7.37/40 2.4/50 8/24  Coagulation Profile: No results for input(s): INR, PROTIME in the last 168 hours.  Cardiac Enzymes: No results for input(s): CKTOTAL, CKMB, CKMBINDEX, TROPONINI in the last 168 hours.  HbA1C: Hgb A1c MFr Bld  Date/Time Value Ref Range Status  06/19/2018 12:17 PM 5.0 4.8 - 5.6 % Final    Comment:    (NOTE) Pre diabetes:          5.7%-6.4% Diabetes:              >6.4% Glycemic control for   <7.0% adults with diabetes     CBG: No results for input(s): GLUCAP in the last 168 hours.  Review of Systems:   Review of Systems  Constitutional: Positive for malaise/fatigue. Negative for weight loss.  HENT: Negative.   Eyes: Negative.   Respiratory: Positive for shortness of breath.   Cardiovascular: Negative.   Gastrointestinal: Negative.   Genitourinary: Negative.   Musculoskeletal: Negative.   Skin: Negative.   Neurological: Negative.   Endo/Heme/Allergies: Negative.   Psychiatric/Behavioral: Negative.        Anxiety  Past Medical History  She,  has a past medical history of Adenomatous polyps, Anemia, Blood transfusion without reported diagnosis (01/1999), Bulging discs, Endometriosis, Hyperlipidemia, Hypertension, PVC (premature ventricular contraction), and Urinary incontinence.   Surgical History    Past Surgical History:  Procedure Laterality Date  . 5 back surgeries    . ABDOMINAL HYSTERECTOMY  2001   TAH/RSO  . AUGMENTATION MAMMAPLASTY Bilateral 2013   saline. pre pectoral  . BLADDER SUSPENSION  2003   [facility]O [facility]cele [facility] at [facility]  . BREAST SURGERY     breast augmentation--saline implants  . HERNIA REPAIR    . LUMBAR FUSION  2010   Dr. Carloyn Manner  . REDUCTION MAMMAPLASTY Bilateral   . SPINE SURGERY       Social History   reports that she has quit smoking. Her smoking use included cigarettes. She has never used smokeless tobacco. She reports current alcohol use of  about 6.0 - 8.0 standard drinks of alcohol per week. She reports current drug use. Drug: Marijuana.   Family History   Her family history includes Breast cancer in her maternal grandmother; Cancer in her father; Cancer (age of onset: 57) in her maternal grandmother; Cancer (age of onset: 26) in her maternal uncle; Hyperlipidemia in her brother and sister; Hypertension in her brother, brother, brother, father, mother, and sister; Stroke in her maternal aunt, maternal uncle, and paternal grandfather.   Allergies Allergies  Allergen Reactions  . Ancef [Cefazolin]     Cause rash shortness of breath and vomiting   . Ceftin [Cefuroxime] Nausea And Vomiting  . Penicillins Hives    Has patient had a PCN reaction causing immediate rash, facial/tongue/throat swelling, SOB or lightheadedness with hypotension: Yes Has patient had a PCN reaction causing severe rash involving mucus membranes or skin necrosis:Yes Has patient had a PCN reaction that required hospitalization:No Has patient had a PCN reaction occurring within the last 10 years: No If all of the above answers are "NO", then may proceed with Cephalosporin use.      Home Medications  Prior to Admission medications   Medication Sig Start Date End Date Taking? Authorizing Provider  ALPRAZolam Duanne Moron) 0.5 G tablet Take 0.5 mg by mouth daily as needed for anxiety. for anxiety 06/11/18  Yes [provider]  aspirin EC 81 MG EC tablet Take 1 tablet (81 mg total) by mouth daily. 06/20/18  Yes Rinehuls, Early Chars, PA-C  buPROPion (WELLBUTRIN XL) 300 MG 24 hr tablet Take 300 mg by mouth daily.   Yes [provider]  ezetimibe (ZETIA) 10 MG tablet Take 1 tablet (10 mg total) by mouth daily. 08/28/18  Yes Venancio Poisson, NP  levofloxacin (LEVAQUIN) 500 MG tablet Take 500 mg by mouth daily.   Yes [provider]  losartan (COZAAR) 100 MG tablet Take 100 mg by mouth daily.    Yes [provider]  metoprolol succinate  (TOPROL-XL) 25 MG 24 hr tablet Take 25 mg by mouth daily.   Yes [provider]  rosuvastatin (CRESTOR) 20 MG tablet Take 1 tablet (20 mg total) by mouth daily at 6 PM. 06/20/18  Yes Rinehuls, Early Chars, PA-C  triamterene-hydrochlorothiazide (MAXZIDE-25) 37.5-25 MG tablet Take 1 tablet by mouth daily.   Yes [provider]  zolpidem (AMBIEN) 10 MG tablet Take 10 mg by mouth at bedtime as needed for sleep.   Yes [provider]

## 2019-01-15 NOTE — ED Provider Notes (Signed)
[facility] DEPT Provider Note   CSN: [id number] Arrival date & time: 01/15/19  0137     History   Chief Complaint Chief Complaint  Patient presents with  . Shortness of Breath    HPI [name] is a 58 y.o. female with a history of CVA, HTN, HLD, endometriosis, who presents to the emergency department by EMS with a chief complaint of shortness of breath.  The patient reports that she was seen around 1 week ago for URI symptoms and was diagnosed with pneumonia.  She reports a chest x-ray was not performed, but she was started on azithromycin and albuterol.  Her symptoms did not improve so she was started on Levaquin yesterday with continued use of home albuterol.  She reports that she developed worsening shortness of breath last night that continued to worsen throughout the evening.  She called EMS after she became extremely winded while attempting to use the restroom.  She denies chest pain, fever, chills, leg swelling, palpitations, back pain, numbness, weakness, nausea, vomiting, diarrhea, or abdominal pain.  EMS reports the patient was given 5 mg of albuterol and 0.5 mg of Atrovent, 125 mg of IV Solu-Medrol, and 2 g of magnesium.  She reports temporary improvement after medications by EMS, but reports that her shortness of breath has started to worsen again.  She reports that she was started on supplemental oxygen by EMS and was told that her oxygen saturation was low, but does not know the number.   No history of asthma or COPD.  She is up-to-date on her influenza vaccination.  She reports that she vapes marijuana and tobacco.  She denies other IV or recreational drug use.  She reports that she flew to [place] earlier this month.  No recent travel outside the country.  No history of DVT or PE.  The history is provided by the patient. No language interpreter was used.    Past Medical History:  Diagnosis Date  . Adenomatous polyps   . Anemia     . Blood transfusion without reported diagnosis 01/1999   following hysterectomy  . Bulging discs   . Endometriosis   . Hyperlipidemia   . Hypertension   . PVC (premature ventricular contraction)   . Urinary incontinence     Patient Active Problem List   Diagnosis Date Noted  . Stroke (cerebrum) ([place]) 06/18/2018  . Adjustment disorder with depressed mood 07/01/2017  . Lumbar stenosis with neurogenic claudication 02/09/2017  . Lumbar stenosis 02/09/2017  . Mixed incontinence 08/05/2014  . HTN (hypertension) 02/07/2012  . Depression 02/07/2012  . Dyslipidemia 02/07/2012  . Menopausal and perimenopausal disorder 02/07/2012    Past Surgical History:  Procedure Laterality Date  . 5 back surgeries    . ABDOMINAL HYSTERECTOMY  2001   TAH/RSO  . AUGMENTATION MAMMAPLASTY Bilateral 2013   saline. pre pectoral  . BLADDER SUSPENSION  2003   LSO and rectocele repair at [facility]  . BREAST SURGERY     breast augmentation--saline implants  . HERNIA REPAIR    . LUMBAR FUSION  2010   Dr. Carloyn Manner  . REDUCTION MAMMAPLASTY Bilateral   . SPINE SURGERY       OB History    Gravida  5   Para  3   Term  3   Preterm      AB  2   Living  3     SAB  2   TAB      Ectopic  Multiple      Live Births               Home Medications    Prior to Admission medications   Medication Sig Start Date End Date Taking? Authorizing Provider  ALPRAZolam Duanne Moron) 0.5 MG tablet Take 0.5 mg by mouth daily as needed for anxiety. for anxiety 06/11/18  Yes [provider]  aspirin EC 81 MG EC tablet Take 1 tablet (81 mg total) by mouth daily. 06/20/18  Yes Rinehuls, Early Chars, PA-C  buPROPion (WELLBUTRIN XL) 300 MG 24 hr tablet Take 300 mg by mouth daily.   Yes [provider]  ezetimibe (ZETIA) 10 MG tablet Take 1 tablet (10 mg total) by mouth daily. 08/28/18  Yes Venancio Poisson, NP  levofloxacin (LEVAQUIN) 500 MG tablet Take 500 mg by mouth daily.   Yes [provider]  losartan (COZAAR) 100 MG tablet Take 100 mg by mouth daily.    Yes [provider]  metoprolol succinate (TOPROL-XL) 25 MG 24 hr tablet Take 25 mg by mouth daily.   Yes [provider]  rosuvastatin (CRESTOR) 20 MG tablet Take 1 tablet (20 mg total) by mouth daily at 6 PM. 06/20/18  Yes Rinehuls, Early Chars, PA-C  triamterene-hydrochlorothiazide (MAXZIDE-25) 37.5-25 MG tablet Take 1 tablet by mouth daily.   Yes [provider]  zolpidem (AMBIEN) 10 MG tablet Take 10 mg by mouth at bedtime as needed for sleep.   Yes [provider]    Family History Family History  Problem Relation Age of Onset  . Hypertension Mother   . Hypertension Father   . Cancer Father   . Hyperlipidemia Sister   . Hypertension Sister   . Hyperlipidemia Brother   . Hypertension Brother   . Hypertension Brother   . Hypertension Brother   . Cancer Maternal Uncle 72       colon ca  . Stroke Maternal Uncle   . Breast cancer Maternal Grandmother   . Cancer Maternal Grandmother 47       colon ca  . Stroke Maternal Aunt   . Stroke Paternal Grandfather     Social History Social History   Tobacco Use  . Smoking status: Former Smoker    Types: Cigarettes  . Smokeless tobacco: Never Used  Substance Use Topics  . Alcohol use: Yes    Alcohol/week: 6.0 - 8.0 standard drinks    Types: 6 - 8 Standard drinks or equivalent per week    Comment: occasionally   . Drug use: Yes    Types: Marijuana    Comment: occassionally     Allergies   Ancef [cefazolin]; Ceftin [cefuroxime]; and Penicillins   Review of Systems Review of Systems  Constitutional: Negative for activity change, chills and fever.  HENT: Negative for congestion, hearing loss, sinus pressure and sinus pain.   Respiratory: Positive for cough, shortness of breath and wheezing.   Cardiovascular: Negative for chest pain, palpitations and leg swelling.  Gastrointestinal: Negative for abdominal pain, diarrhea, nausea  and vomiting.  Genitourinary: Negative for dysuria.  Musculoskeletal: Negative for back pain.  Skin: Negative for rash.  Allergic/Immunologic: Negative for immunocompromised state.  Neurological: Negative for seizures, syncope, weakness and headaches.  Psychiatric/Behavioral: Negative for confusion.   Physical Exam Updated Vital Signs BP (!) 161/102   Pulse (!) 108   Temp 98.3 F (36.8 C) (Oral)   Resp (!) 29   SpO2 100%   Physical Exam Vitals signs and nursing note reviewed.  Constitutional:      General: She is in acute distress.  HENT:     Head: Normocephalic.  Eyes:     Extraocular Movements: Extraocular movements intact.     Conjunctiva/sclera: Conjunctivae normal.  Neck:     Musculoskeletal: Normal range of motion and neck supple.  Cardiovascular:     Rate and Rhythm: Regular rhythm. Tachycardia present.     Heart sounds: Normal heart sounds. No murmur. No friction rub. No gallop.   Pulmonary:     Effort: Tachypnea and accessory muscle usage present. No respiratory distress.     Breath sounds: Wheezing and rhonchi present.     Comments: Rhonchi and wheezes noted bilaterally throughout.  Tachypnea.  Increased work of breathing with accessory muscle use.  No retractions. Chest:     Chest wall: No deformity, tenderness, crepitus or edema.  Abdominal:     General: There is no distension.     Palpations: Abdomen is soft. There is no hepatomegaly or mass.     Tenderness: There is no abdominal tenderness.  Musculoskeletal:     Right lower leg: She exhibits no tenderness. No edema.     Left lower leg: She exhibits no tenderness. No edema.  Skin:    General: Skin is warm.     Findings: No rash.  Neurological:     Mental Status: She is alert.     Comments: Appears anxious  Psychiatric:        Behavior: Behavior normal.      ED Treatments / Results  Labs (all labs ordered are listed, but only abnormal results are displayed) Labs Reviewed  CBC WITH  DIFFERENTIAL/PLATELET - Abnormal; Notable for the following components:      Result Value   WBC 11.7 (*)    RBC 3.75 (*)    MCV 100.8 (*)    Neutro Abs 10.7 (*)    Lymphs Abs 0.6 (*)    All other components within normal limits  COMPREHENSIVE METABOLIC PANEL - Abnormal; Notable for the following components:   Glucose, Bld 169 (*)    BUN 21 (*)    Total Protein 8.3 (*)    Anion gap 16 (*)    All other components within normal limits  BLOOD GAS, VENOUS - Abnormal; Notable for the following components:   pCO2, Ven 42.4 (*)    pO2, Ven 58.0 (*)    All other components within normal limits  BRAIN NATRIURETIC PEPTIDE  INFLUENZA PANEL BY PCR (TYPE A & B)  I-STAT TROPONIN, ED    EKG EKG Interpretation  Date/Time:  Thursday January 15 2019 01:47:34 EST Ventricular Rate:  132 PR Interval:    QRS Duration: 83 QT Interval:  317 QTC Calculation: 470 R Axis:   66 Text Interpretation:  Sinus tachycardia Probable anterior infarct, old No acute changes No significant change since last tracing Confirmed by Varney Biles [id number]) on 01/15/2019 5:02:54 AM   Radiology Dg Chest 2 View  Result Date: 01/15/2019 CLINICAL DATA:  Shortness of breath with wheezing EXAM: CHEST - 2 VIEW COMPARISON:  06/18/2018 FINDINGS: The heart size and mediastinal contours are within normal limits. Both lungs are clear. Mild degenerative changes of the spine. IMPRESSION: No active cardiopulmonary disease. Electronically Signed   By: Donavan Foil M.D.   On: 01/15/2019 02:09    Procedures .Critical Care Performed by: Joanne Gavel, PA-C Authorized by: Joanne Gavel, PA-C   Critical care provider statement:    Critical care time (minutes):  45  Critical care time was exclusive of:  Separately billable procedures and treating other patients and teaching time   Critical care was necessary to treat or prevent imminent or life-threatening deterioration of the following conditions:  Respiratory failure    Critical care was time spent personally by me on the following activities:  Ordering and performing treatments and interventions, ordering and review of laboratory studies, ordering and review of radiographic studies, pulse oximetry, re-evaluation of patient's condition, review of old charts, evaluation of patient's response to treatment, development of treatment plan with patient or surrogate and obtaining history from patient or surrogate   (including critical care time)  Medications Ordered in ED Medications  metoprolol succinate (TOPROL-XL) 24 hr tablet 25 mg (has no administration in time range)  sodium chloride (PF) 0.9 % injection (has no administration in time range)  iopamidol (ISOVUE-370) 76 % injection (has no administration in time range)  albuterol (PROVENTIL) (2.5 MG/3ML) 0.083% nebulizer solution 5 mg (5 mg Nebulization Given 01/15/19 0216)  albuterol (PROVENTIL) (2.5 MG/3ML) 0.083% nebulizer solution 5 mg (5 mg Nebulization Given 01/15/19 0412)  ipratropium (ATROVENT) nebulizer solution 0.5 mg (0.5 mg Nebulization Given 01/15/19 0412)  sodium chloride 0.9 % bolus 1,000 mL (1,000 mLs Intravenous New Bag/Given 01/15/19 0512)  LORazepam (ATIVAN) injection 0.5 mg (0.5 mg Intravenous Given 01/15/19 0513)  iopamidol (ISOVUE-370) 76 % injection 100 mL (100 mLs Intravenous Contrast Given 01/15/19 0732)     Initial Impression / Assessment and Plan / ED Course  I have reviewed the triage vital signs and the nursing notes.  Pertinent labs & imaging results that were available during my care of the patient were reviewed by me and considered in my medical decision making (see chart for details).     58 year old female with a history of CVA, HTN, HLD, endometriosis presenting with shortness of breath.  On initial examination, patient was rhonchorous and wheezing throughout.  She was given 2 g of magnesium sulfate, 125 mg of IV Solu-Medrol, and albuterol and ipratropium treatment by EMS in route.   She had mild initial improvement, but remained tachypneic.   After multiple breathing treatments in the ER, wheezing and rhonchi resolved, but she continued to have diminished breath sounds bilaterally and tachypnea at around 30 times per minute.  She was tachycardic in the 120s on arrival, but improved to 100s after a small dose of Ativan and IV fluids.  The patient was discussed and evaluated with Dr. Kathrynn Humble, attending physician.  Given continued tachypnea despite aggressive treatment, the patient was placed on BiPAP.  On reevaluation, she reported significant improvement with BiPAP and appears much more comfortable.  Labs are notable for pH of 7.37.  Mild leukocytosis of 11.7 with neutrophil count of 10.7, which could be secondary to corticosteroid use so she was given Solu-Medrol in room.  Chest x-ray is unremarkable.  The patient does vape both marijuana and tobacco.  No recent travel outside of the country.  Given the patient's continued tachypnea, will order influenza, which is negative.  She does have risk factors for PE given recent travel.  Will order PE study and plan for admission.  Patient care transferred to [facility] pending CT PE study and admission at the end of my shift. Patient presentation, ED course, and plan of care discussed with review of all pertinent labs and imaging. Please see his/her note for further details regarding further ED course and disposition.  EKG with sinus tachycardia, but otherwise no acute changes from previous tracing.  Troponin  is negative.  Final Clinical Impressions(s) / ED Diagnoses   Final diagnoses:  None    ED Discharge Orders    None       McDonald, Mia A, PA-C 01/15/19 Dimock, Ankit, MD 01/16/19 [phone]

## 2019-01-15 NOTE — ED Notes (Signed)
Pt transported to and from CT.

## 2019-01-15 NOTE — ED Notes (Signed)
EKG given to EDP,Nanavati,MD., for review. 

## 2019-01-15 NOTE — Progress Notes (Signed)
Patient removed biapap- stated she was going to vomit- zofran administered. Headache 7 out of 10- patient requested tylenol. Patient currently on 2L [place] oz sat 9% and RR 19- will continue to monitor

## 2019-01-16 DIAGNOSIS — J9601 Acute respiratory failure with hypoxia: Secondary | ICD-10-CM | POA: Diagnosis present

## 2019-01-16 DIAGNOSIS — Z981 Arthrodesis status: Secondary | ICD-10-CM | POA: Diagnosis not present

## 2019-01-16 DIAGNOSIS — I10 Essential (primary) hypertension: Secondary | ICD-10-CM | POA: Diagnosis present

## 2019-01-16 DIAGNOSIS — G4709 Other insomnia: Secondary | ICD-10-CM | POA: Diagnosis present

## 2019-01-16 DIAGNOSIS — F1290 Cannabis use, unspecified, uncomplicated: Secondary | ICD-10-CM | POA: Diagnosis present

## 2019-01-16 DIAGNOSIS — Z8249 Family history of ischemic heart disease and other diseases of the circulatory system: Secondary | ICD-10-CM | POA: Diagnosis not present

## 2019-01-16 DIAGNOSIS — T380X5A Adverse effect of glucocorticoids and synthetic analogues, initial encounter: Secondary | ICD-10-CM | POA: Diagnosis present

## 2019-01-16 DIAGNOSIS — J209 Acute bronchitis, unspecified: Secondary | ICD-10-CM | POA: Diagnosis present

## 2019-01-16 DIAGNOSIS — F329 Major depressive disorder, single episode, unspecified: Secondary | ICD-10-CM | POA: Diagnosis present

## 2019-01-16 DIAGNOSIS — Z90721 Acquired absence of ovaries, unilateral: Secondary | ICD-10-CM | POA: Diagnosis not present

## 2019-01-16 DIAGNOSIS — F419 Anxiety disorder, unspecified: Secondary | ICD-10-CM | POA: Diagnosis present

## 2019-01-16 DIAGNOSIS — Z9882 Breast implant status: Secondary | ICD-10-CM | POA: Diagnosis not present

## 2019-01-16 DIAGNOSIS — Z8349 Family history of other endocrine, nutritional and metabolic diseases: Secondary | ICD-10-CM | POA: Diagnosis not present

## 2019-01-16 DIAGNOSIS — Z716 Tobacco abuse counseling: Secondary | ICD-10-CM | POA: Diagnosis not present

## 2019-01-16 DIAGNOSIS — E785 Hyperlipidemia, unspecified: Secondary | ICD-10-CM | POA: Diagnosis present

## 2019-01-16 DIAGNOSIS — R911 Solitary pulmonary nodule: Secondary | ICD-10-CM | POA: Diagnosis present

## 2019-01-16 DIAGNOSIS — Z86010 Personal history of colonic polyps: Secondary | ICD-10-CM | POA: Diagnosis not present

## 2019-01-16 DIAGNOSIS — R739 Hyperglycemia, unspecified: Secondary | ICD-10-CM | POA: Diagnosis present

## 2019-01-16 DIAGNOSIS — Z8679 Personal history of other diseases of the circulatory system: Secondary | ICD-10-CM | POA: Diagnosis not present

## 2019-01-16 DIAGNOSIS — Z8673 Personal history of transient ischemic attack (TIA), and cerebral infarction without residual deficits: Secondary | ICD-10-CM | POA: Diagnosis not present

## 2019-01-16 DIAGNOSIS — F17290 Nicotine dependence, other tobacco product, uncomplicated: Secondary | ICD-10-CM | POA: Diagnosis present

## 2019-01-16 DIAGNOSIS — Z803 Family history of malignant neoplasm of breast: Secondary | ICD-10-CM | POA: Diagnosis not present

## 2019-01-16 DIAGNOSIS — R0602 Shortness of breath: Secondary | ICD-10-CM | POA: Diagnosis present

## 2019-01-16 DIAGNOSIS — Z90710 Acquired absence of both cervix and uterus: Secondary | ICD-10-CM | POA: Diagnosis not present

## 2019-01-16 DIAGNOSIS — Z823 Family history of stroke: Secondary | ICD-10-CM | POA: Diagnosis not present

## 2019-01-16 HISTORY — DX: Acute respiratory failure with hypoxia: J96.01

## 2019-01-16 LAB — COMPREHENSIVE METABOLIC PANEL
ALT: 18 U/L (ref 0–44)
AST: 18 U/L (ref 15–41)
Albumin: 3.6 g/dL (ref 3.5–5.0)
Alkaline Phosphatase: 62 U/L (ref 38–126)
Anion gap: 10 (ref 5–15)
BUN: 18 mg/dL (ref 6–20)
CO2: 24 mmol/L (ref 22–32)
Calcium: 8.9 mg/dL (ref 8.9–10.3)
Chloride: 103 mmol/L (ref 98–111)
Creatinine, Ser: 0.9 mg/dL (ref 0.44–1.00)
GFR calc Af Amer: >60 mL/min (ref >60)
GFR calc non Af Amer: >60 mL/min (ref >60)
Glucose, Bld: 163 mg/dL — ABNORMAL HIGH (ref 70–99)
Potassium: 4.1 mmol/L (ref 3.5–5.1)
Sodium: 137 mmol/L (ref 135–145)
Total Bilirubin: 0.4 mg/dL (ref 0.3–1.2)
Total Protein: 6.6 g/dL (ref 6.5–8.1)

## 2019-01-16 LAB — CBC WITH DIFFERENTIAL/PLATELET
Abs Immature Granulocytes: 0.1 10*3/uL — ABNORMAL HIGH (ref 0.00–0.07)
Basophils Absolute: 0 10*3/uL (ref 0.0–0.1)
Basophils Relative: 0 %
Eosinophils Absolute: 0.2 10*3/uL (ref 0.0–0.5)
Eosinophils Relative: 1 %
HCT: 32.4 % — ABNORMAL LOW (ref 36.0–46.0)
Hemoglobin: 10.3 g/dL — ABNORMAL LOW (ref 12.0–15.0)
Immature Granulocytes: 1 %
Lymphocytes Relative: 4 %
Lymphs Abs: 0.4 10*3/uL — ABNORMAL LOW (ref 0.7–4.0)
MCH: 32.3 pg (ref 26.0–34.0)
MCHC: 31.8 g/dL (ref 30.0–36.0)
MCV: 101.6 fL — ABNORMAL HIGH (ref 80.0–100.0)
Monocytes Absolute: 0.3 10*3/uL (ref 0.1–1.0)
Monocytes Relative: 3 %
Neutro Abs: 9.9 10*3/uL — ABNORMAL HIGH (ref 1.7–7.7)
Neutrophils Relative %: 91 %
Platelets: 274 10*3/uL (ref 150–400)
RBC: 3.19 MIL/uL — ABNORMAL LOW (ref 3.87–5.11)
RDW: 13.2 % (ref 11.5–15.5)
WBC: 10.9 10*3/uL — ABNORMAL HIGH (ref 4.0–10.5)
nRBC: 0 % (ref 0.0–0.2)

## 2019-01-16 LAB — GLUCOSE, CAPILLARY
Glucose-Capillary: 169 mg/dL — ABNORMAL HIGH (ref 70–99)
Glucose-Capillary: 139 mg/dL — ABNORMAL HIGH (ref 70–99)

## 2019-01-16 LAB — RAPID URINE DRUG SCREEN, HOSP PERFORMED
Amphetamines: NOT DETECTED
Barbiturates: NOT DETECTED
Benzodiazepines: POSITIVE — AB
Cocaine: NOT DETECTED
Opiates: NOT DETECTED
Tetrahydrocannabinol: NOT DETECTED

## 2019-01-16 MED ORDER — METOPROLOL SUCCINATE ER 25 MG PO TB24
25.0000 mg | ORAL_TABLET | Freq: Every day | ORAL | Status: DC
  Administered 2019-01-16 – 2019-01-18 (×3): 25 mg via ORAL
  Filled 2019-01-16 (×3): qty 1

## 2019-01-16 MED ORDER — ALBUTEROL SULFATE (2.5 MG/3ML) 0.083% IN NEBU
2.5000 mg | INHALATION_SOLUTION | Freq: Four times a day (QID) | RESPIRATORY_TRACT | Status: DC | PRN
  Administered 2019-01-16: 2.5 mg via RESPIRATORY_TRACT
  Filled 2019-01-16: qty 3

## 2019-01-16 MED ORDER — CHLORHEXIDINE GLUCONATE 0.12 % MT SOLN
15.0000 mL | Freq: Two times a day (BID) | OROMUCOSAL | Status: DC
  Administered 2019-01-16 – 2019-01-18 (×4): 15 mL via OROMUCOSAL
  Filled 2019-01-16 (×4): qty 15

## 2019-01-16 MED ORDER — ORAL CARE MOUTH RINSE: 15.0000 mL | Freq: Two times a day (BID) | OROMUCOSAL | Status: DC

## 2019-01-16 MED ORDER — METHYLPREDNISOLONE SODIUM SUCC 40 MG IJ SOLR
40.0000 mg | Freq: Two times a day (BID) | INTRAMUSCULAR | Status: DC
  Administered 2019-01-16 – 2019-01-18 (×4): 40 mg via INTRAVENOUS
  Filled 2019-01-16 (×4): qty 1

## 2019-01-16 MED ORDER — ALBUTEROL SULFATE (2.5 MG/3ML) 0.083% IN NEBU
2.5000 mg | INHALATION_SOLUTION | Freq: Four times a day (QID) | RESPIRATORY_TRACT | Status: DC
  Administered 2019-01-16 – 2019-01-18 (×9): 2.5 mg via RESPIRATORY_TRACT
  Filled 2019-01-16 (×9): qty 3

## 2019-01-16 NOTE — Progress Notes (Addendum)
NAME:  Exie Chrismer, MRN:  [id number], DOB:  02/04/1961, LOS: 0 ADMISSION DATE:  01/15/2019, CONSULTATION DATE: 01/15/2019 REFERRING MD: ED physician, CHIEF COMPLAINT: Shortness of breath  Brief History   Patient has been sick for about a week and a half with a respiratory complaint of shortness of breath, wheezing, cough productive of sputum Quit smoking about 9 years ago Uses vape-last use about 2 days ago Symptoms did not improve with course of antibiotics On second course of antibiotics Presented to the hospital because she could not catch her breath at all Past Medical History  Anemia, lumbar disc disease, adenomatous polyps  She has vape for about a year, commercially produced, up to 8 times a day, nicotine and marijuana  Significant Hospital Events   1/23 currently on BiPAP-feels a little bit better, not as wheezy, still short of breath 1/24: Wheezing resolved denies distress but still short of breath anxious about walking and getting out of bed Consults:  Pulmonary 01/15/2019  Procedures:  None  Significant Diagnostic Tests:  CT scan of the chest--reviewed by myself  Radiological IMPRESSION: 1. No demonstrable pulmonary embolus. No thoracic aortic aneurysm or dissection.  2. 3 mm nodular opacity abutting the pleura in the right upper lobe. No follow-up needed if patient is low-risk. Non-contrast chest CT can be considered in 12 months if patient is high-risk.  3.  No appreciable thoracic adenopathy.  Micro Data:  Respiratory viral panel 01/15/2019-negative Influenza PCR negative  Antimicrobials:  Levaquin 1/21>>  Interim history/subjective:  Feeling better Objective   Blood pressure (Abnormal) 143/77, pulse 91, temperature 97.7 F (36.5 C), temperature source Oral, resp. rate (Abnormal) 24, height 5\' 5"  (1.651 m), weight 67.6 kg, SpO2 99 %.    FiO2 (%):  [30 %] 30 %   Intake/Output Summary (Last 24 hours) at 01/16/2019 0954 Last data filed at 01/16/2019  4696 Gross per 24 hour  Intake 1565.42 ml  Output 900 ml  Net 665.42 ml   Filed Weights   01/16/19 0400  Weight: 67.6 kg    Examination: General: Well-developed otherwise healthy 58 year old female currently resting in bed currently anxious but in no acute distress HEENT normocephalic atraumatic no jugular venous distention voice is hoarse mucous membranes are moist Pulmonary: Some scattered rhonchi no wheezing today Cardiac: Regular rate and rhythm without murmur rub or gallop Abdomen: Soft nontender no organomegaly Extremities: Warm dry brisk capillary refill Neuro: Awake oriented no focal deficits  Resolved Hospital Problem list     Assessment & Plan:  Hypoxemic respiratory failure in setting of bronchospasm secondary to acute purulent bronchitis -I wonder about underlying reactive airway disease -History of vaping THC -CT chest negative for pneumonia -Now off BiPAP Plan/recommendation Wean oxygen Mobilize Change systemic IV steroids to every 12, at 40 mg Add scheduled albuterol for now, I think she can probably go home with just an MDI on a as needed basis Would complete 5 days total of Levaquin We will set her up for follow-up with [place], we could consider pulmonary function testing and even methacholine challenge in the future if patient interested alternatively given her rare episodes of bronchospasm might be reasonable to just provide her with an MDI for as needed basis Needs to avoid vaping  Hypertension Plan Continue home regimen  Lung nodule on CT scan Plan  follow-up with [place], will need repeat CT imaging in 12 months    Best practice:  Diet: Regular diet as tolerated Pain/Anxiety/Delirium protocol (if indicated): Lorazepam for anxiety VAP protocol (if  indicated):  DVT prophylaxis: Enoxaparin GI prophylaxis:  Glucose control:  Mobility: As tolerated, bedrest at present Code Status: Full code Family Communication: No family best Disposition: Patient  will be admitted to stepdown unit to continue to wean off BiPAP as able, she is better, I would keep her in the stepdown unit or tele  for another 24 hours (if struggle w/ ambulation keep in SDU), I do not think she is ready for discharge.  Although clearly she is improving.  Today let us continue systemic steroids at reduced dosing, IV antibiotics, and allow her to mobilize some.  Erick Colace ACNP-BC [place] Pager # [phone] OR # [phone] if no answer

## 2019-01-16 NOTE — Progress Notes (Signed)
Physical Therapy Evaluation Patient Details Name: [name] MRN: [id number] DOB: 10/28/1961 Today's Date: 01/16/2019   History of Present Illness  Pt admitted 2* SOB with hypoxemic respiratory therapy.  Pt with hx of htn and CVA  Clinical Impression  Pt admitted as above and presenting with functional mobility limitations 2* generalized weakness, ambulatory balance deficits and limited endurance.  Pt should progress to dc home with prn assist of family.    Follow Up Recommendations No PT follow up    Equipment Recommendations  None recommended by PT    Recommendations for Other Services       Precautions / Restrictions Precautions Precautions: Fall Restrictions Weight Bearing Restrictions: No      Mobility  Bed Mobility Overal bed mobility: Modified Independent             General bed mobility comments: Pt unassisted to/from EOB  Transfers Overall transfer level: Needs assistance Equipment used: 1 person hand held assist Transfers: Sit to/from Stand Sit to Stand: Min guard         General transfer comment: steady assist only  Ambulation/Gait Ambulation/Gait assistance: Min assist;+2 safety/equipment Gait Distance (Feet): 230 Feet Assistive device: 1 person hand held assist Gait Pattern/deviations: Step-through pattern;Decreased step length - right;Decreased step length - left;Shuffle;Narrow base of support Gait velocity: decr   General Gait Details: decreased pace with several short rests 2* c/o tighness in chest.  Mod general instability and several episodes near balance loss.  Pt ambulating on RA and maintained sats at 93% or higher.  Stairs            Wheelchair Mobility    Modified Rankin (Stroke Patients Only)       Balance Overall balance assessment: Needs assistance Sitting-balance support: No upper extremity supported;Feet supported Sitting balance-Leahy Scale: Good     Standing balance support: No upper extremity  supported Standing balance-Leahy Scale: Fair                               Pertinent Vitals/Pain Pain Assessment: No/denies pain    Home Living Family/patient expects to be discharged to:: Private residence Living Arrangements: Alone Available Help at Discharge: Family;Available PRN/intermittently Type of Home: House Home Access: Stairs to enter Entrance Stairs-Rails: Left Entrance Stairs-Number of Steps: 7 Home Layout: Two level;Bed/bath upstairs;Able to live on main level with bedroom/bathroom Home Equipment: None      Prior Function Level of Independence: Independent         Comments: works as Clinical biochemist [employer]   Dominant Hand: Right    Extremity/Trunk Assessment   Upper Extremity Assessment Upper Extremity Assessment: Generalized weakness    Lower Extremity Assessment Lower Extremity Assessment: Generalized weakness    Cervical / Trunk Assessment Cervical / Trunk Assessment: Normal  Communication   Communication: No difficulties  Cognition Arousal/Alertness: Awake/alert Behavior During Therapy: WFL for tasks assessed/performed Overall Cognitive Status: Within Functional Limits for tasks assessed                                        General Comments      Exercises     Assessment/Plan    PT Assessment Patient needs continued PT services  PT Problem List Decreased strength;Decreased activity tolerance;Decreased balance;Decreased mobility;Decreased knowledge of use of DME       PT  Treatment Interventions DME instruction;Gait training;Stair training;Functional mobility training;Therapeutic activities;Therapeutic exercise;Patient/family education;Balance training    PT Goals (Current goals can be found in the Care Plan section)  Acute Rehab PT Goals Patient Stated Goal: Regain IND PT Goal Formulation: With patient Time For Goal Achievement: 01/30/19 Potential to Achieve Goals: Good     Frequency Min 3X/week   Barriers to discharge        Co-evaluation               AM-PAC PT "6 Clicks" Mobility  Outcome Measure Help needed turning from your back to your side while in a flat bed without using bedrails?: None Help needed moving from lying on your back to sitting on the side of a flat bed without using bedrails?: None Help needed moving to and from a bed to a chair (including a wheelchair)?: A Little Help needed standing up from a chair using your arms (e.g., wheelchair or bedside chair)?: A Little Help needed to walk in hospital room?: A Little Help needed climbing 3-5 steps with a railing? : A Lot 6 Click Score: 19    End of Session Equipment Utilized During Treatment: Gait belt Activity Tolerance: Patient tolerated treatment well;Patient limited by fatigue Patient left: in bed;with call bell/phone within reach;with nursing/sitter in room Nurse Communication: Mobility status PT Visit Diagnosis: Unsteadiness on feet (R26.81);Muscle weakness (generalized) (M62.81)    Time: 0100-7121 PT Time Calculation (min) (ACUTE ONLY): 17 min   Charges:   PT Evaluation $PT Eval Low Complexity: 1 Low          [place] Pager [phone] Office [phone]   Casey Maxfield 01/16/2019, 1:09 PM

## 2019-01-16 NOTE — Progress Notes (Signed)
PROGRESS NOTE  [name] WEX:[id number] DOB: 12-05-1961 DOA: 01/15/2019 PCP: Derinda Late, MD  HPI/Recap of past 24 hours: [name] is a 58 y.o. female with medical history significant of adenomatous polyps, anemia, endometriosis, history of blood loss anemia following hysterectomy, bulging discs, hyperlipidemia, hypertension, urinary incontinence, history of PVCs who is coming to the emergency department with complaints of progressively worse shortness of breath since yesterday evening.  She states that she started having URI symptoms about a week ago and was diagnosed with pneumonia.  However, a chest radiograph was now done.  She was started on azithromycin and albuterol.    She followed up yesterday due to lack of improvement and was started on Levaquin.  She was told to continue her home albuterol.  However, later in the evening she became very dyspneic with nonproductive cough, wheezing and fatigue.  She denies fever, chills, myalgia mild sore throat and rhinorrhea a week earlier.  No hemoptysis, chest pain, palpitations, dizziness, diaphoresis, PND, orthopnea or pitting edema of the lower extremities.  She denies diarrhea or constipation.  She denies dysuria, frequency or hematuria.  No polyuria, polydipsia, polyphagia or blurred vision.  01/16/2019: Patient seen and examined at her bedside.  States she feels a little better.  Breathing is starting to improve.  Patient admits to vaping with marijuana.  Assessment/Plan: Principal Problem:   Acute respiratory failure with hypoxemia (HCC) Active Problems:   HTN (hypertension)   Depression   Dyslipidemia   Hyperglycemia   Pulmonary nodule   Acute respiratory failure with hypoxia (HCC)  Acute respiratory failure with hypoxemia (HCC) suspect secondary to acute bronchitis Maintain O2 saturation greater than 92% Continue BiPAP as needed PCCM has been consulted and following.  Highly appreciated. Continue management as  recommended by PCCM Admits to vaping with marijuana Obtain UDS  Acute bronchitis Continue antibiotics and bronchodilators as recommended by PCCM Continue to closely monitor in the stepdown unit  Right pleural upper lobe nodule on CT chest Independently reviewed chest x-ray and CT chest done on admission which revealed a nodule on right pleural upper lobe measuring 3 mm in size Recommend surveillance outpatient   HTN (hypertension) Continue losartan 100 mg p.o. daily. Continue Toprol-XL 25 mg p.o. daily. Resume Maxide tomorrow morning. Monitor BP, HR, renal function and electrolytes.    Depression Continue Wellbutrin XL 300 mg p.o. daily. Continue alprazolam 0.5 mg p.o daily. as needed.    Dyslipidemia Continue rosuvastatin 20 mg p.o. daily. Monitor LFTs. Fasting lipid profile follow-up as an outpatient.    Hyperglycemia Monitor blood glucose. Check hemoglobin A1c if hyperglycemia is persistent. Carbohydrate modified diet while on glucocorticoids.    Pulmonary nodule Will need follow-up CT in 12 months.    DVT prophylaxis: Lovenox SQ. Code Status: Full code. Family Communication:  Disposition Plan: Admit to stepdown for treatment and close monitoring. Consults called: Pulmonology was contacted by the EDP and will evaluate the patient.   Objective: Vitals:   01/16/19 1200 01/16/19 1201 01/16/19 1300 01/16/19 1400  BP: (!) 184/96  (!) 150/103 127/64  Pulse: 84  71 71  Resp: 19  17 (!) 25  Temp: 97.8 F (36.6 C)     TempSrc: Oral     SpO2: 100% 97% 100% 100%  Weight:      Height:        Intake/Output Summary (Last 24 hours) at 01/16/2019 1446 Last data filed at 01/16/2019 1034 Gross per 24 hour  Intake 1243.93 ml  Output 1800 ml  Net -  556.07 ml   Filed Weights   01/16/19 0400  Weight: 67.6 kg    Exam:  . General: 58 y.o. year-old female well developed well nourished in no acute distress.  Alert and oriented x3. . Cardiovascular: Regular rate  and rhythm with no rubs or gallops.  No thyromegaly or JVD noted.   Marland Kitchen Respiratory: Mild rales at bases with no wheezes. Good inspiratory effort. . Abdomen: Soft nontender nondistended with normal bowel sounds x4 quadrants. . Musculoskeletal: No lower extremity edema. 2/4 pulses in all 4 extremities. . Skin: No ulcerative lesions noted or rashes, . Psychiatry: Mood is appropriate for condition and setting   Data Reviewed: CBC: Recent Labs  Lab 01/15/19 0454 01/16/19 0245  WBC 11.7* 10.9*  NEUTROABS 10.7* 9.9*  HGB 12.2 10.3*  HCT 37.8 32.4*  MCV 100.8* 101.6*  PLT 283 785   Basic Metabolic Panel: Recent Labs  Lab 01/15/19 0454 01/16/19 0245  NA 142 137  K 3.5 4.1  CL 104 103  CO2 22 24  GLUCOSE 169* 163*  BUN 21* 18  CREATININE 0.97 0.90  CALCIUM 9.4 8.9  MG 2.3  --   PHOS 3.5  --    GFR: Estimated Creatinine Clearance: 61.3 mL/min (by C-G formula based on SCr of 0.9 mg/dL). Liver Function Tests: Recent Labs  Lab 01/15/19 0454 01/16/19 0245  AST 25 18  ALT 21 18  ALKPHOS 71 62  BILITOT 0.9 0.4  PROT 8.3* 6.6  ALBUMIN 4.9 3.6   No results for input(s): LIPASE, AMYLASE in the last 168 hours. No results for input(s): AMMONIA in the last 168 hours. Coagulation Profile: No results for input(s): INR, PROTIME in the last 168 hours. Cardiac Enzymes: No results for input(s): CKTOTAL, CKMB, CKMBINDEX, TROPONINI in the last 168 hours. BNP (last 3 results) No results for input(s): PROBNP in the last 8760 hours. HbA1C: No results for input(s): HGBA1C in the last 72 hours. CBG: Recent Labs  Lab 01/15/19 1612 01/16/19 0835 01/16/19 1317  GLUCAP 128* 169* 139*   Lipid Profile: No results for input(s): CHOL, HDL, LDLCALC, TRIG, CHOLHDL, LDLDIRECT in the last 72 hours. Thyroid Function Tests: No results for input(s): TSH, T4TOTAL, FREET4, T3FREE, THYROIDAB in the last 72 hours. Anemia Panel: No results for input(s): VITAMINB12, FOLATE, FERRITIN, TIBC, IRON,  RETICCTPCT in the last 72 hours. Urine analysis:    Component Value Date/Time   COLORURINE YELLOW 09/23/2008 1004   APPEARANCEUR CLEAR 09/23/2008 1004   LABSPEC 1.028 09/23/2008 1004   PHURINE 5.0 09/23/2008 1004   GLUCOSEU NEGATIVE 09/23/2008 1004   HGBUR NEGATIVE 09/23/2008 1004   BILIRUBINUR N 04/27/2013 0944   KETONESUR NEGATIVE 09/23/2008 1004   PROTEINUR N 04/27/2013 0944   PROTEINUR NEGATIVE 09/23/2008 1004   UROBILINOGEN negative 04/27/2013 0944   UROBILINOGEN 0.2 09/23/2008 1004   NITRITE N 04/27/2013 0944   NITRITE NEGATIVE 09/23/2008 1004   LEUKOCYTESUR Negative 04/27/2013 0944   Sepsis Labs: @LABRCNTIP (procalcitonin:4,lacticidven:4)  ) Recent Results (from the past 240 hour(s))  Respiratory Panel by PCR     Status: None   Collection Time: 01/15/19  6:11 AM  Result Value Ref Range Status   Adenovirus NOT DETECTED NOT DETECTED Final   Coronavirus 229E NOT DETECTED NOT DETECTED Final   Coronavirus HKU1 NOT DETECTED NOT DETECTED Final   Coronavirus NL63 NOT DETECTED NOT DETECTED Final   Coronavirus OC43 NOT DETECTED NOT DETECTED Final   Metapneumovirus NOT DETECTED NOT DETECTED Final   Rhinovirus / Enterovirus NOT DETECTED NOT DETECTED  Final   Influenza A NOT DETECTED NOT DETECTED Final   Influenza B NOT DETECTED NOT DETECTED Final   Parainfluenza Virus 1 NOT DETECTED NOT DETECTED Final   Parainfluenza Virus 2 NOT DETECTED NOT DETECTED Final   Parainfluenza Virus 3 NOT DETECTED NOT DETECTED Final   Parainfluenza Virus 4 NOT DETECTED NOT DETECTED Final   Respiratory Syncytial Virus NOT DETECTED NOT DETECTED Final   Bordetella pertussis NOT DETECTED NOT DETECTED Final   Chlamydophila pneumoniae NOT DETECTED NOT DETECTED Final   Mycoplasma pneumoniae NOT DETECTED NOT DETECTED Final    Comment: Performed at [facility], [place] [address], [place], [place] [address]  MRSA PCR Screening     Status: None   Collection Time: 01/15/19 12:00 PM  Result Value Ref Range  Status   MRSA by PCR NEGATIVE NEGATIVE Final    Comment:        The GeneXpert MRSA Assay (FDA approved for NASAL specimens only), is one component of a comprehensive MRSA colonization surveillance program. It is not intended to diagnose MRSA infection nor to guide or monitor treatment for MRSA infections. Performed at [facility], [place] [address], [place], [place] [address]       Studies: No results found.  Scheduled Meds: . albuterol  2.5 mg Nebulization QID  . aspirin EC  81 mg Oral Daily  . buPROPion  300 mg Oral Daily  . chlorhexidine  15 mL Mouth Rinse BID  . enoxaparin (LOVENOX) injection  40 mg Subcutaneous Q24H  . ezetimibe  10 mg Oral Daily  . losartan  100 mg Oral Daily  . mouth rinse  15 mL Mouth Rinse q12n4p  . methylPREDNISolone (SOLU-MEDROL) injection  40 mg Intravenous Q12H  . metoprolol succinate  25 mg Oral Daily  . potassium chloride  20 mEq Oral Once  . rosuvastatin  20 mg Oral q1800  . triamterene-hydrochlorothiazide  1 tablet Oral Daily    Continuous Infusions: . sodium chloride Stopped (01/15/19 1051)  . levofloxacin (LEVAQUIN) IV Stopped (01/16/19 1246)     LOS: 0 days     Kayleen Memos, MD [facility] Pager [phone]  If 7PM-7AM, please contact night-coverage [email or web address] Password [facility] 01/16/2019, 2:46 PM

## 2019-01-17 LAB — GLUCOSE, CAPILLARY
Glucose-Capillary: 115 mg/dL — ABNORMAL HIGH (ref 70–99)
Glucose-Capillary: 139 mg/dL — ABNORMAL HIGH (ref 70–99)

## 2019-01-17 LAB — BASIC METABOLIC PANEL
Anion gap: 12 (ref 5–15)
BUN: 36 mg/dL — ABNORMAL HIGH (ref 6–20)
CO2: 26 mmol/L (ref 22–32)
Calcium: 9.5 mg/dL (ref 8.9–10.3)
Chloride: 101 mmol/L (ref 98–111)
Creatinine, Ser: 1.07 mg/dL — ABNORMAL HIGH (ref 0.44–1.00)
GFR calc Af Amer: >60 mL/min (ref >60)
GFR calc non Af Amer: 57 mL/min — ABNORMAL LOW (ref >60)
Glucose, Bld: 120 mg/dL — ABNORMAL HIGH (ref 70–99)
Potassium: 4.2 mmol/L (ref 3.5–5.1)
Sodium: 139 mmol/L (ref 135–145)

## 2019-01-17 LAB — CBC WITH DIFFERENTIAL/PLATELET
Abs Immature Granulocytes: 0.08 10*3/uL — ABNORMAL HIGH (ref 0.00–0.07)
Basophils Absolute: 0 10*3/uL (ref 0.0–0.1)
Basophils Relative: 0 %
Eosinophils Absolute: 0 10*3/uL (ref 0.0–0.5)
Eosinophils Relative: 0 %
HCT: 31.7 % — ABNORMAL LOW (ref 36.0–46.0)
Hemoglobin: 10.1 g/dL — ABNORMAL LOW (ref 12.0–15.0)
Immature Granulocytes: 1 %
Lymphocytes Relative: 5 %
Lymphs Abs: 0.5 10*3/uL — ABNORMAL LOW (ref 0.7–4.0)
MCH: 32.6 pg (ref 26.0–34.0)
MCHC: 31.9 g/dL (ref 30.0–36.0)
MCV: 102.3 fL — ABNORMAL HIGH (ref 80.0–100.0)
Monocytes Absolute: 0.4 10*3/uL (ref 0.1–1.0)
Monocytes Relative: 4 %
Neutro Abs: 8.5 10*3/uL — ABNORMAL HIGH (ref 1.7–7.7)
Neutrophils Relative %: 90 %
Platelets: 237 10*3/uL (ref 150–400)
RBC: 3.1 MIL/uL — ABNORMAL LOW (ref 3.87–5.11)
RDW: 13.2 % (ref 11.5–15.5)
WBC: 9.5 10*3/uL (ref 4.0–10.5)
nRBC: 0 % (ref 0.0–0.2)

## 2019-01-17 LAB — HIV ANTIBODY (ROUTINE TESTING W REFLEX): HIV Screen 4th Generation wRfx: NONREACTIVE

## 2019-01-17 LAB — HEMOGLOBIN A1C
Hgb A1c MFr Bld: 5.5 % (ref 4.8–5.6)
Mean Plasma Glucose: 111.15 mg/dL

## 2019-01-17 MED ORDER — SODIUM CHLORIDE 3 % IN NEBU
4.0000 mL | INHALATION_SOLUTION | Freq: Three times a day (TID) | RESPIRATORY_TRACT | Status: DC
  Administered 2019-01-17 – 2019-01-18 (×3): 4 mL via RESPIRATORY_TRACT
  Filled 2019-01-17 (×5): qty 4

## 2019-01-17 MED ORDER — GUAIFENESIN ER 600 MG PO TB12: 600.0000 mg | ORAL_TABLET | Freq: Two times a day (BID) | ORAL | Status: DC

## 2019-01-17 MED ORDER — LEVOFLOXACIN 750 MG PO TABS
750.0000 mg | ORAL_TABLET | Freq: Every day | ORAL | Status: DC
  Administered 2019-01-17 – 2019-01-18 (×2): 750 mg via ORAL
  Filled 2019-01-17 (×2): qty 1

## 2019-01-17 MED ORDER — PHENOL 1.4 % MT LIQD
2.0000 | OROMUCOSAL | Status: DC | PRN
  Filled 2019-01-17: qty 177

## 2019-01-17 MED ORDER — ALPRAZOLAM 0.5 MG PO TABS
0.5000 mg | ORAL_TABLET | Freq: Once | ORAL | Status: AC
  Administered 2019-01-17: 0.5 mg via ORAL
  Filled 2019-01-17: qty 1

## 2019-01-17 MED ORDER — BENZONATATE 100 MG PO CAPS
100.0000 mg | ORAL_CAPSULE | Freq: Three times a day (TID) | ORAL | Status: DC | PRN
  Administered 2019-01-17: 100 mg via ORAL
  Filled 2019-01-17: qty 1

## 2019-01-17 MED ORDER — GUAIFENESIN ER 600 MG PO TB12
1200.0000 mg | ORAL_TABLET | Freq: Two times a day (BID) | ORAL | Status: DC
  Administered 2019-01-17 – 2019-01-18 (×3): 1200 mg via ORAL
  Filled 2019-01-17 (×3): qty 2

## 2019-01-17 MED ORDER — BENZONATATE 100 MG PO CAPS
200.0000 mg | ORAL_CAPSULE | Freq: Three times a day (TID) | ORAL | Status: DC
  Administered 2019-01-17: 200 mg via ORAL
  Filled 2019-01-17: qty 2

## 2019-01-17 NOTE — Progress Notes (Signed)
Physical Therapy Treatment Patient Details Name: [name] MRN: [id number] DOB: October 15, 1961 Today's Date: 01/17/2019    History of Present Illness Pt admitted 2* SOB with hypoxemic respiratory therapy.  Pt with hx of htn and CVA    PT Comments    Pt motivated and with noted improvement in ambulatory stability and activity tolerance this date.   Follow Up Recommendations  No PT follow up     Equipment Recommendations  None recommended by PT    Recommendations for Other Services       Precautions / Restrictions Precautions Precautions: Fall Restrictions Weight Bearing Restrictions: No    Mobility  Bed Mobility Overal bed mobility: Modified Independent             General bed mobility comments: Pt unassisted to/from EOB  Transfers Overall transfer level: Needs assistance   Transfers: Sit to/from Stand Sit to Stand: Min guard         General transfer comment: steady assist only  Ambulation/Gait Ambulation/Gait assistance: +2 safety/equipment;Min assist;Min guard Gait Distance (Feet): 430 Feet Assistive device: 1 person hand held assist Gait Pattern/deviations: Step-through pattern;Decreased step length - right;Decreased step length - left;Shuffle;Narrow base of support Gait velocity: decr   General Gait Details: decreased pace with several short rests 2* c/o tighness in chest.  Mod general instability and several episodes near balance loss.  Pt ambulating on RA and maintained sats at 93% or higher.   Stairs             Wheelchair Mobility    Modified Rankin (Stroke Patients Only)       Balance Overall balance assessment: Needs assistance Sitting-balance support: No upper extremity supported;Feet supported Sitting balance-Leahy Scale: Good     Standing balance support: No upper extremity supported Standing balance-Leahy Scale: Fair                              Cognition Arousal/Alertness: Awake/alert Behavior During  Therapy: WFL for tasks assessed/performed Overall Cognitive Status: Within Functional Limits for tasks assessed                                        Exercises      General Comments        Pertinent Vitals/Pain Pain Assessment: No/denies pain    Home Living                      Prior Function            PT Goals (current goals can now be found in the care plan section) Acute Rehab PT Goals Patient Stated Goal: Regain IND PT Goal Formulation: With patient Time For Goal Achievement: 01/30/19 Potential to Achieve Goals: Good Progress towards PT goals: Progressing toward goals    Frequency    Min 3X/week      PT Plan Current plan remains appropriate    Co-evaluation              AM-PAC PT "6 Clicks" Mobility   Outcome Measure  Help needed turning from your back to your side while in a flat bed without using bedrails?: None Help needed moving from lying on your back to sitting on the side of a flat bed without using bedrails?: None Help needed moving to and from a bed to a chair (including a wheelchair)?:  A Little Help needed standing up from a chair using your arms (e.g., wheelchair or bedside chair)?: A Little Help needed to walk in hospital room?: A Little Help needed climbing 3-5 steps with a railing? : A Little 6 Click Score: 20    End of Session Equipment Utilized During Treatment: Gait belt Activity Tolerance: Patient tolerated treatment well;Patient limited by fatigue Patient left: in bed;with call bell/phone within reach Nurse Communication: Mobility status PT Visit Diagnosis: Unsteadiness on feet (R26.81);Muscle weakness (generalized) (M62.81)     Time: 1005-1016 PT Time Calculation (min) (ACUTE ONLY): 11 min  Charges:  $Gait Training: 8-22 mins                     [place] Pager [phone] Office [phone]    Norelle Runnion 01/17/2019, 11:28 AM

## 2019-01-17 NOTE — Progress Notes (Signed)
PROGRESS NOTE  [name] EXB:[id number] DOB: [birth year] DOA: 01/15/2019 PCP: Derinda Late, MD  HPI/Recap of past 24 hours: [name] is a 58 y.o. female with medical history significant of adenomatous polyps, anemia, endometriosis, history of blood loss anemia following hysterectomy, bulging discs, hyperlipidemia, hypertension, urinary incontinence, history of PVCs who is coming to the emergency department with complaints of progressively worse shortness of breath since yesterday evening.  She states that she started having URI symptoms about a week ago and was diagnosed with pneumonia.  However, a chest radiograph was now done.  She was started on azithromycin and albuterol.    She followed up yesterday due to lack of improvement and was started on Levaquin.  She was told to continue her home albuterol.  However, later in the evening she became very dyspneic with nonproductive cough, wheezing and fatigue.  She denies fever, chills, myalgia mild sore throat and rhinorrhea a week earlier.  No hemoptysis, chest pain, palpitations, dizziness, diaphoresis, PND, orthopnea or pitting edema of the lower extremities.  She denies diarrhea or constipation.  She denies dysuria, frequency or hematuria.  No polyuria, polydipsia, polyphagia or blurred vision.  01/16/2019: Patient seen and examined at her bedside.  States she feels a little better.  Breathing is starting to improve.  Patient admits to vaping with marijuana.  01/17/2019: Patient seen and examined at bedside.  She states she has chest congestion and sore throat.  Dyspnea on exertion is improving.  At baseline she is active and plays tennis 4 times a week.  Started on Chloraseptic spray and pulmonary toilet this morning.  We will continue to treat and possibly discharge early morning tomorrow 01/18/2019.  Assessment/Plan: Principal Problem:   Acute respiratory failure with hypoxemia (HCC) Active Problems:   HTN (hypertension)  Depression   Dyslipidemia   Hyperglycemia   Pulmonary nodule   Acute respiratory failure with hypoxia (HCC)  Acute respiratory failure with hypoxemia (HCC) suspect secondary to acute bronchitis Improving Continue BiPAP as needed Continue management as recommended by PCCM Admits to vaping with marijuana  Acute bronchitis, improving Continue antibiotics and bronchodilators as recommended by PCCM Started pulmonary toilet with Mucinex 1200 mg twice daily, hypersaline nebs and flutter valve  Sore throat suspect secondary to postnasal drip Start Chloraseptic Spray as needed Exam revealed postnasal drip  Right pleural upper lobe nodule on CT chest Independently reviewed chest x-ray and CT chest done on admission which revealed a nodule on right pleural upper lobe measuring 3 mm in size Recommend surveillance outpatient   HTN (hypertension) Continue losartan 100 mg p.o. daily. Continue Toprol-XL 25 mg p.o. daily. Resume Maxide  Monitor BP, HR, renal function and electrolytes.    Depression Continue Wellbutrin XL 300 mg p.o. daily. Continue alprazolam 0.5 mg p.o daily. as needed.    Dyslipidemia Continue rosuvastatin 20 mg p.o. daily. Monitor LFTs. Fasting lipid profile follow-up as an outpatient.    Hyperglycemia Monitor blood glucose. Last hemoglobin A1c 5.0 on 06/19/2018 Obtain A1c    Pulmonary nodule Will need follow-up CT in 12 months.    DVT prophylaxis:  Daily Lovenox SQ. Code Status: Full code. Family Communication:  Disposition Plan:  Possible discharge tomorrow 01/18/2019 Consults called: Pulmonology was contacted and signed off on 01/17/2019   Objective: Vitals:   01/17/19 0300 01/17/19 0349 01/17/19 0715 01/17/19 0800  BP: (!) 161/77   (!) 151/91  Pulse: 65   83  Resp: 20   19  Temp:  98.6 F (37 C) 97.6 F (  36.4 C)   TempSrc:  Oral Oral   SpO2: 95%   91%  Weight:      Height:        Intake/Output Summary (Last 24 hours) at 01/17/2019  1132 Last data filed at 01/17/2019 0900 Gross per 24 hour  Intake 867.2 ml  Output -  Net 867.2 ml   Filed Weights   01/16/19 0400  Weight: 67.6 kg    Exam:  . General: 58 y.o. year-old female well-developed well-nourished in no acute distress.  Alert and oriented x3. . Cardiovascular: Regular rate and rhythm with no rubs or gallops.  No JVD or thyromegaly noted.  Marland Kitchen Respiratory: Clear to auscultation with no wheezes or rales.  Good inspiratory effort.   . Abdomen: Soft nontender nondistended with normal bowel sounds x4 quadrants. . Musculoskeletal: No lower extremity edema. 2/4 pulses in all 4 extremities. Marland Kitchen Psychiatry: Mood is appropriate for condition and setting   Data Reviewed: CBC: Recent Labs  Lab 01/15/19 0454 01/16/19 0245 01/17/19 0309  WBC 11.7* 10.9* 9.5  NEUTROABS 10.7* 9.9* 8.5*  HGB 12.2 10.3* 10.1*  HCT 37.8 32.4* 31.7*  MCV 100.8* 101.6* 102.3*  PLT 283 274 706   Basic Metabolic Panel: Recent Labs  Lab 01/15/19 0454 01/16/19 0245  NA 142 137  K 3.5 4.1  CL 104 103  CO2 22 24  GLUCOSE 169* 163*  BUN 21* 18  CREATININE 0.97 0.90  CALCIUM 9.4 8.9  MG 2.3  --   PHOS 3.5  --    GFR: Estimated Creatinine Clearance: 61.3 mL/min (by C-G formula based on SCr of 0.9 mg/dL). Liver Function Tests: Recent Labs  Lab 01/15/19 0454 01/16/19 0245  AST 25 18  ALT 21 18  ALKPHOS 71 62  BILITOT 0.9 0.4  PROT 8.3* 6.6  ALBUMIN 4.9 3.6   No results for input(s): LIPASE, AMYLASE in the last 168 hours. No results for input(s): AMMONIA in the last 168 hours. Coagulation Profile: No results for input(s): INR, PROTIME in the last 168 hours. Cardiac Enzymes: No results for input(s): CKTOTAL, CKMB, CKMBINDEX, TROPONINI in the last 168 hours. BNP (last 3 results) No results for input(s): PROBNP in the last 8760 hours. HbA1C: No results for input(s): HGBA1C in the last 72 hours. CBG: Recent Labs  Lab 01/15/19 1612 01/16/19 0835 01/16/19 1317  01/17/19 0747  GLUCAP 128* 169* 139* 115*   Lipid Profile: No results for input(s): CHOL, HDL, LDLCALC, TRIG, CHOLHDL, LDLDIRECT in the last 72 hours. Thyroid Function Tests: No results for input(s): TSH, T4TOTAL, FREET4, T3FREE, THYROIDAB in the last 72 hours. Anemia Panel: No results for input(s): VITAMINB12, FOLATE, FERRITIN, TIBC, IRON, RETICCTPCT in the last 72 hours. Urine analysis:    Component Value Date/Time   COLORURINE YELLOW 09/23/2008 1004   APPEARANCEUR CLEAR 09/23/2008 1004   LABSPEC 1.028 09/23/2008 1004   PHURINE 5.0 09/23/2008 1004   GLUCOSEU NEGATIVE 09/23/2008 1004   HGBUR NEGATIVE 09/23/2008 1004   BILIRUBINUR N 04/27/2013 [place] 09/23/2008 1004   PROTEINUR N 04/27/2013 0944   PROTEINUR NEGATIVE 09/23/2008 1004   UROBILINOGEN negative 04/27/2013 0944   UROBILINOGEN 0.2 09/23/2008 1004   NITRITE N 04/27/2013 0944   NITRITE NEGATIVE 09/23/2008 1004   LEUKOCYTESUR Negative 04/27/2013 0944   Sepsis Labs: @LABRCNTIP (procalcitonin:4,lacticidven:4)  ) Recent Results (from the past 240 hour(s))  Respiratory Panel by PCR     Status: None   Collection Time: 01/15/19  6:11 AM  Result Value Ref Range Status  Adenovirus NOT DETECTED NOT DETECTED Final   Coronavirus 229E NOT DETECTED NOT DETECTED Final   Coronavirus HKU1 NOT DETECTED NOT DETECTED Final   Coronavirus NL63 NOT DETECTED NOT DETECTED Final   Coronavirus OC43 NOT DETECTED NOT DETECTED Final   Metapneumovirus NOT DETECTED NOT DETECTED Final   Rhinovirus / Enterovirus NOT DETECTED NOT DETECTED Final   Influenza A NOT DETECTED NOT DETECTED Final   Influenza B NOT DETECTED NOT DETECTED Final   Parainfluenza Virus 1 NOT DETECTED NOT DETECTED Final   Parainfluenza Virus 2 NOT DETECTED NOT DETECTED Final   Parainfluenza Virus 3 NOT DETECTED NOT DETECTED Final   Parainfluenza Virus 4 NOT DETECTED NOT DETECTED Final   Respiratory Syncytial Virus NOT DETECTED NOT DETECTED Final    Bordetella pertussis NOT DETECTED NOT DETECTED Final   Chlamydophila pneumoniae NOT DETECTED NOT DETECTED Final   Mycoplasma pneumoniae NOT DETECTED NOT DETECTED Final    Comment: Performed at [facility], [place] [address], [place], [place] [address]  MRSA PCR Screening     Status: None   Collection Time: 01/15/19 12:00 PM  Result Value Ref Range Status   MRSA by PCR NEGATIVE NEGATIVE Final    Comment:        The GeneXpert MRSA Assay (FDA approved for NASAL specimens only), is one component of a comprehensive MRSA colonization surveillance program. It is not intended to diagnose MRSA infection nor to guide or monitor treatment for MRSA infections. Performed at [facility], [place] [address], [place], [place] [address]       Studies: No results found.  Scheduled Meds: . albuterol  2.5 mg Nebulization QID  . aspirin EC  81 mg Oral Daily  . benzonatate  200 mg Oral TID  . buPROPion  300 mg Oral Daily  . chlorhexidine  15 mL Mouth Rinse BID  . enoxaparin (LOVENOX) injection  40 mg Subcutaneous Q24H  . ezetimibe  10 mg Oral Daily  . guaiFENesin  1,200 mg Oral BID  . levofloxacin  750 mg Oral Daily  . losartan  100 mg Oral Daily  . mouth rinse  15 mL Mouth Rinse q12n4p  . methylPREDNISolone (SOLU-MEDROL) injection  40 mg Intravenous Q12H  . metoprolol succinate  25 mg Oral Daily  . potassium chloride  20 mEq Oral Once  . rosuvastatin  20 mg Oral q1800  . sodium chloride HYPERTONIC  4 mL Nebulization TID  . triamterene-hydrochlorothiazide  1 tablet Oral Daily    Continuous Infusions: . sodium chloride Stopped (01/15/19 1051)     LOS: 1 day     Kayleen Memos, MD [facility] Pager [phone]  If 7PM-7AM, please contact night-coverage [email or web address] Password [facility] 01/17/2019, 11:32 AM

## 2019-01-17 NOTE — Progress Notes (Signed)
NAME:  [name], MRN:  [id number], DOB:  08-10-1961, LOS: 1 ADMISSION DATE:  01/15/2019, CONSULTATION DATE: 01/15/2019 REFERRING MD: ED physician, CHIEF COMPLAINT: Shortness of breath  Brief History   Patient has been sick for about a week and a half with a respiratory complaint of shortness of breath, wheezing, cough productive of sputum Quit smoking about 9 years ago Uses vape-last use about 2 days ago Symptoms did not improve with course of antibiotics On second course of antibiotics Presented to the hospital because she could not catch her breath at all -Improved Past Medical History  Anemia, lumbar disc disease, adenomatous polyps  She has vape for about a year, commercially produced, up to 8 times a day, nicotine and marijuana  Significant Hospital Events   1/23 currently on BiPAP-feels a little bit better, not as wheezy, still short of breath 1/24: Wheezing resolved denies distress but still short of breath anxious about walking and getting out of bed 1/25 wheezing resolved, still coughing but better generally Consults:  Pulmonary 01/15/2019  Procedures:  None  Significant Diagnostic Tests:  CT scan of the chest--reviewed by myself  Radiological IMPRESSION: 1. No demonstrable pulmonary embolus. No thoracic aortic aneurysm or dissection.  2. 3 mm nodular opacity abutting the pleura in the right upper lobe. No follow-up needed if patient is low-risk. Non-contrast chest CT can be considered in 12 months if patient is high-risk.  3.  No appreciable thoracic adenopathy.  Micro Data:  Respiratory viral panel 01/15/2019-negative Influenza PCR negative   Antimicrobials:  Levaquin 1/21>>  Interim history/subjective:  Feeling better Objective   Blood pressure (!) 161/77, pulse 65, temperature 97.6 F (36.4 C), temperature source Oral, resp. rate 20, height 5\' 5"  (1.651 m), weight 67.6 kg, SpO2 95 %.        Intake/Output Summary (Last 24 hours) at 01/17/2019  3244 Last data filed at 01/16/2019 1717 Gross per 24 hour  Intake 387.2 ml  Output 900 ml  Net -512.8 ml   Filed Weights   01/16/19 0400  Weight: 67.6 kg    Examination: General: Well-developed otherwise healthy 58 year old female, does appear comfortable HEENT normocephalic atraumatic no JVD, moist oral mucosa  Pulmonary: Clear breath sounds bilaterally  Cardiac: S1-S2 appreciated Abdomen: Soft nontender no organomegaly Extremities: No edema, no clubbing Neuro: Awake oriented no focal deficits  Resolved Hospital Problem list     Assessment & Plan:  Hypoxemic respiratory failure in setting of bronchospasm secondary to acute purulent bronchitis -I wonder about underlying reactive airway disease -History of vaping THC -CT chest negative for pneumonia -On room air -Appears to be stabilizing -Still has a cough-consistent with having airway inflammation  Plan/recommendation Wean oxygen Mobilize We will switch to prednisone 40 p.o. daily for 5 to 7 days To continue albuterol I think she can probably go home with just an MDI on a as needed basis Would complete 5 days total of Levaquin-already day 4 We will be glad to see her in the office for follow-up in about 4 weeks Needs to avoid vaping  Hypertension Plan Continue home regimen   Lung nodule on CT scan Plan  follow-up with [place], will need repeat CT imaging in 12 months  Patient may be discharged home from a pulmonary perspective Complete course of Levaquin Complete steroids Encouraged to take it easy for the next few days to allow herself time to completely recover   Best practice:  Diet: Regular diet as tolerated Pain/Anxiety/Delirium protocol (if indicated): Lorazepam for anxiety VAP protocol (  if indicated):  DVT prophylaxis: Enoxaparin GI prophylaxis:  Glucose control:  Mobility: As tolerated, bedrest at present Code Status: Full code Family Communication: No family best Disposition: May be discharged  from a pulmonary perspective.  Ander Slade MD Pulmonary

## 2019-01-17 NOTE — Progress Notes (Signed)
Pt transferred to floor. All belongings at bedside. No c/o pain or distress noted.

## 2019-01-17 NOTE — Progress Notes (Signed)
Called report to [place] and gave report to [employer], pt transferred in wheelchair on room air.

## 2019-01-18 LAB — BASIC METABOLIC PANEL
Anion gap: 11 (ref 5–15)
BUN: 40 mg/dL — ABNORMAL HIGH (ref 6–20)
CO2: 25 mmol/L (ref 22–32)
Calcium: 9.4 mg/dL (ref 8.9–10.3)
Chloride: 103 mmol/L (ref 98–111)
Creatinine, Ser: 1.1 mg/dL — ABNORMAL HIGH (ref 0.44–1.00)
GFR calc Af Amer: >60 mL/min (ref >60)
GFR calc non Af Amer: 55 mL/min — ABNORMAL LOW (ref >60)
Glucose, Bld: 145 mg/dL — ABNORMAL HIGH (ref 70–99)
Potassium: 4.4 mmol/L (ref 3.5–5.1)
Sodium: 139 mmol/L (ref 135–145)

## 2019-01-18 LAB — CBC WITH DIFFERENTIAL/PLATELET
Abs Immature Granulocytes: 0.09 10*3/uL — ABNORMAL HIGH (ref 0.00–0.07)
Basophils Absolute: 0 10*3/uL (ref 0.0–0.1)
Basophils Relative: 0 %
Eosinophils Absolute: 0 10*3/uL (ref 0.0–0.5)
Eosinophils Relative: 0 %
HCT: 37.4 % (ref 36.0–46.0)
Hemoglobin: 11.9 g/dL — ABNORMAL LOW (ref 12.0–15.0)
Immature Granulocytes: 1 %
Lymphocytes Relative: 10 %
Lymphs Abs: 0.9 10*3/uL (ref 0.7–4.0)
MCH: 32.2 pg (ref 26.0–34.0)
MCHC: 31.8 g/dL (ref 30.0–36.0)
MCV: 101.1 fL — ABNORMAL HIGH (ref 80.0–100.0)
Monocytes Absolute: 0.3 10*3/uL (ref 0.1–1.0)
Monocytes Relative: 3 %
Neutro Abs: 8 10*3/uL — ABNORMAL HIGH (ref 1.7–7.7)
Neutrophils Relative %: 86 %
Platelets: 273 10*3/uL (ref 150–400)
RBC: 3.7 MIL/uL — ABNORMAL LOW (ref 3.87–5.11)
RDW: 13.1 % (ref 11.5–15.5)
WBC: 9.3 10*3/uL (ref 4.0–10.5)
nRBC: 0 % (ref 0.0–0.2)

## 2019-01-18 LAB — GLUCOSE, CAPILLARY: Glucose-Capillary: 119 mg/dL — ABNORMAL HIGH (ref 70–99)

## 2019-01-18 MED ORDER — BENZONATATE 100 MG PO CAPS: 100.0000 mg | ORAL_CAPSULE | Freq: Three times a day (TID) | ORAL | 0 refills | Status: DC | PRN

## 2019-01-18 MED ORDER — PREDNISONE 20 MG PO TABS: 40.0000 mg | ORAL_TABLET | Freq: Every day | ORAL | Status: DC

## 2019-01-18 MED ORDER — PREDNISONE 20 MG PO TABS: 40.0000 mg | ORAL_TABLET | Freq: Every day | ORAL | 0 refills | Status: AC

## 2019-01-18 MED ORDER — PHENOL 1.4 % MT LIQD: 2.0000 | OROMUCOSAL | 0 refills | Status: DC | PRN

## 2019-01-18 MED ORDER — LEVOFLOXACIN 750 MG PO TABS: 750.0000 mg | ORAL_TABLET | Freq: Every day | ORAL | 0 refills | Status: AC

## 2019-01-18 MED ORDER — ALBUTEROL SULFATE (2.5 MG/3ML) 0.083% IN NEBU: 2.5000 mg | INHALATION_SOLUTION | Freq: Two times a day (BID) | RESPIRATORY_TRACT | 0 refills | Status: DC | PRN

## 2019-01-18 MED ORDER — FOLIC ACID 1 MG PO TABS: 1.0000 mg | ORAL_TABLET | Freq: Every day | ORAL | 0 refills | Status: DC

## 2019-01-18 MED ORDER — FOLIC ACID 1 MG PO TABS
1.0000 mg | ORAL_TABLET | Freq: Every day | ORAL | Status: DC
  Administered 2019-01-18: 1 mg via ORAL
  Filled 2019-01-18: qty 1

## 2019-01-18 NOTE — Discharge Instructions (Signed)
Acute Respiratory Failure, Adult ° °Acute respiratory failure occurs when there is not enough oxygen passing from your lungs to your body. When this happens, your lungs have trouble removing carbon dioxide from the blood. This causes your blood oxygen level to drop too low as carbon dioxide builds up. °Acute respiratory failure is a medical emergency. It can develop quickly, but it is temporary if treated promptly. Your lung capacity, or how much air your lungs can hold, may improve with time, exercise, and treatment. °What are the causes? °There are many possible causes of acute respiratory failure, including: °· Lung injury. °· Chest injury or damage to the ribs or tissues near the lungs. °· Lung conditions that affect the flow of air and blood into and out of the lungs, such as pneumonia, acute respiratory distress syndrome, and cystic fibrosis. °· Medical conditions, such as strokes or spinal cord injuries, that affect the muscles and nerves that control breathing. °· Blood infection (sepsis). °· Inflammation of the pancreas (pancreatitis). °· A blood clot in the lungs (pulmonary embolism). °· A large-volume blood transfusion. °· Burns. °· Near-drowning. °· Seizure. °· Smoke inhalation. °· Reaction to medicines. °· Alcohol or drug overdose. °What increases the risk? °This condition is more likely to develop in people who have: °· A blocked airway. °· Asthma. °· A condition or disease that damages or weakens the muscles, nerves, bones, or tissues that are involved in breathing. °· A serious infection. °· A health problem that blocks the unconscious reflex that is involved in breathing, such as hypothyroidism or sleep apnea. °· A lung injury or trauma. °What are the signs or symptoms? °Trouble breathing is the main symptom of acute respiratory failure. Symptoms may also include: °· Rapid breathing. °· Restlessness or anxiety. °· Skin, lips, or fingernails that appear blue (cyanosis). °· Rapid heart  rate. °· Abnormal heart rhythms (arrhythmias). °· Confusion or changes in behavior. °· Tiredness or loss of energy. °· Feeling sleepy or having a loss of consciousness. °How is this diagnosed? °Your health care provider can diagnose acute respiratory failure with a medical history and physical exam. During the exam, your health care provider will listen to your heart and check for crackling or wheezing sounds in your lungs. Your may also have tests to confirm the diagnosis and determine what is causing respiratory failure. These tests may include: °· Measuring the amount of oxygen in your blood (pulse oximetry). The measurement comes from a small device that is placed on your finger, earlobe, or toe. °· Other blood tests to measure blood gases and to look for signs of infection. °· Sampling your cerebral spinal fluid or tracheal fluid to check for infections. °· Chest X-ray to look for fluid in spaces that should be filled with air. °· Electrocardiogram (ECG) to look at the heart's electrical activity. °How is this treated? °Treatment for this condition usually takes places in a hospital intensive care unit (ICU). Treatment depends on what is causing the condition. It may include one or more treatments until your symptoms improve. Treatment may include: °· Supplemental oxygen. Extra oxygen is given through a tube in the nose, a face mask, or a hood. °· A device such as a continuous positive airway pressure (CPAP) or bi-level positive airway pressure (BiPAP or BPAP) machine. This treatment uses mild air pressure to keep the airways open. A mask or other device will be placed over your nose or mouth. A tube that is connected to a motor will deliver oxygen through the   mask. °· Ventilator. This treatment helps move air into and out of the lungs. This may be done with a bag and mask or a machine. For this treatment, a tube is placed in your windpipe (trachea) so air and oxygen can flow to the lungs. °· Extracorporeal  membrane oxygenation (ECMO). This treatment temporarily takes over the function of the heart and lungs, supplying oxygen and removing carbon dioxide. ECMO gives the lungs a chance to recover. It may be used if a ventilator is not effective. °· Tracheostomy. This is a procedure that creates a hole in the neck to insert a breathing tube. °· Receiving fluids and medicines. °· Rocking the bed to help breathing. °Follow these instructions at home: °· Take over-the-counter and prescription medicines only as told by your health care provider. °· Return to normal activities as told by your health care provider. Ask your health care provider what activities are safe for you. °· Keep all follow-up visits as told by your health care provider. This is important. °How is this prevented? °Treating infections and medical conditions that may lead to acute respiratory failure can help prevent the condition from developing. °Contact a health care provider if: °· You have a fever. °· Your symptoms do not improve or they get worse. °Get help right away if: °· You are having trouble breathing. °· You lose consciousness. °· Your have cyanosis or turn blue. °· You develop a rapid heart rate. °· You are confused. °These symptoms may represent a serious problem that is an emergency. Do not wait to see if the symptoms will go away. Get medical help right away. Call your local emergency services (911 in the [place] Do not drive yourself to the hospital. °This information is not intended to replace advice given to you by your health care provider. Make sure you discuss any questions you have with your health care provider. °Document Released: 12/15/2013 Document Revised: 07/07/2016 Document Reviewed: 06/27/2016 °[employer] Interactive Patient Education © 2019 [employer] ° °

## 2019-01-18 NOTE — Care Management (Signed)
Contacted [facility] for neb machine for home. Will deliver to room prior to dc. Jonnie Finner RN CCM Case Mgmt phone [phone]

## 2019-01-18 NOTE — Progress Notes (Signed)
Discharge instructions gone over with patient. Questions answered. Neb machine delivered to patient's room and patient had respiratory rx prior to discharge. Discharge via wheelchair to friend who will bring her home.

## 2019-01-18 NOTE — Discharge Summary (Signed)
Discharge Summary  [name] BDZ:[id number] DOB: 12/05/1961  PCP: Derinda Late, MD  Admit date: 01/15/2019 Discharge date: 01/18/2019  Time spent: 35 minutes  Recommendations for Outpatient Follow-up:  1. Follow-up with pulmonology 2. Follow-up with your primary care provider 3. Take your medications as prescribed 4. Avoid smoking or vaping  3 mm nodular opacity abutting the pleura in the right upper lobe. No follow-up needed if patient is low-risk. Non-contrast chest CT can be considered in 12 months if patient is high-risk.  Discharge Diagnoses:  Active Hospital Problems   Diagnosis Date Noted  . Acute respiratory failure with hypoxemia ([place]) 01/15/2019  . Acute respiratory failure with hypoxia ([place]) 01/16/2019  . Hyperglycemia 01/15/2019  . Pulmonary nodule 01/15/2019  . Depression 02/07/2012  . Dyslipidemia 02/07/2012  . HTN (hypertension) 02/07/2012    Resolved Hospital Problems  No resolved problems to display.    Discharge Condition: Stable  Diet recommendation: Resume previous diet  Vitals:   01/18/19 0520 01/18/19 0724  BP: 138/89   Pulse: (!) 59   Resp: 18   Temp: 98 F (36.7 C)   SpO2: 99% 98%    History of present illness:  [name] a 58 y.o.femalewith medical history significant ofadenomatous polyps, anemia, endometriosis, history of blood loss anemia following hysterectomy, lumbar disc disease, hyperlipidemia, hypertension, urinary incontinence, history of vaping who is coming to the emergency department with complaints of progressively worsening shortness of breath since yesterday evening. She states that she started having URI symptoms about a week ago and was diagnosed and treated with pneumonia outpatient with no improvement of her symptoms. Quit smoking about 9 years ago.  Uses CPAP.  Last use about 2 days prior to presentation.  Symptoms did not improve with course of antibiotics.  On second course of antibiotics presented to  the hospital because she could not catch her breath at all.  Admitted for acute hypoxic respiratory failure.  Hospital course complicated by dyspnea with minimal exertion which improved and is resolving.  At baseline she is active and plays tennis 4 times a week.  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  01/18/2019: Patient seen and examined at her bedside.  No acute events overnight.  She states she feels much better.  She has been able to ambulate without any difficulty.  Her cough is also improved.  On the day of discharge, the patient was hemodynamically stable.  She will need to follow-up with pulmonology and call for a follow-up appointment.  She will also need to follow-up with her PCP posthospitalization.   PLAN/RECOMMENDATIONS PER PULMONOLOGY: Wean oxygen Mobilize We will switch to prednisone 40 p.o. daily for 5 to 7 days To continue albuterol I think she can probably go home with just an MDI on a as needed basis Would complete 5 days total of Levaquin-already day 4 We will be glad to see her in the office for follow-up in about 4 weeks Needs to avoid vaping   Hospital Course:  Principal Problem:   Acute respiratory failure with hypoxemia ([place]) Active Problems:   HTN (hypertension)   Depression   Dyslipidemia   Hyperglycemia   Pulmonary nodule   Acute respiratory failure with hypoxia ([place])  Resolved acute respiratory failure with hypoxemia ([place]) suspect secondary to acute bronchitis Good O2 saturation on room air Completed BiPAP which was as needed Avoid vaping or smoking Still has a cough-consistent with having airway inflammation Continue prednisone 40 mg daily x5 days Continue p.o. Levaquin 750  mg daily x1 day Continue albuterol nebs twice daily as needed for shortness of breath or wheezing Follow-up with pulmonology outpatient  Acute bronchitis, improving Continue antibiotics, steroids, and bronchodilators as recommended by PCCM  Sore throat  suspect secondary to postnasal drip Continue Chloraseptic Spray as needed Exam revealed postnasal drip  Right pleural upper lobe nodule on CT chest Independently reviewed chest x-ray and CT chest done on admission which revealed a nodule on right pleural upper lobe measuring 3 mm in size 3 mm nodular opacity abutting the pleura in the right upper lobe. No follow-up needed if patient is low-risk. Non-contrast chest CT can be considered in 12 months if patient is high-risk. No appreciable thoracic adenopathy.  HTN (hypertension) Continue losartan 100 mg p.o. daily. Continue Toprol-XL 25 mg p.o. daily. Continue Maxide  Follow-up with your PCP Obtain BMP at next PCP visit posthospitalization  Depression Continue Wellbutrin XL 300 mg p.o. daily. Continue alprazolam 0.5 mg p.o daily as needed.  Steroid-induced insomnia Continue Ambien as needed  Dyslipidemia Continue rosuvastatin 20 mg p.o. daily and Zetia 10 mg daily.  Steroid-induced hyperglycemia Monitor blood glucose. Last hemoglobin A1c 5.0 on 06/19/2018 Latest hemoglobin A1c done on 01/17/2019 revealed A1c 5.5%     Code Status:Full code.  Consults called:Pulmonology 01/15/2019   Discharge Exam: BP 138/89 (BP Location: Right Arm)   Pulse (!) 59   Temp 98 F (36.7 C) (Oral)   Resp 18   Ht 5\' 5"  (1.651 m)   Wt 67.6 kg Comment: pt states weighs 139lbs every day  SpO2 98%   BMI 24.79 kg/m  . General: 58 y.o. year-old female well developed well nourished in no acute distress.  Alert and oriented x3. . Cardiovascular: Regular rate and rhythm with no rubs or gallops.  No thyromegaly or JVD noted.   Marland Kitchen Respiratory: Clear to auscultation with no wheezes or rales. Good inspiratory effort. . Abdomen: Soft nontender nondistended with normal bowel sounds x4 quadrants. . Musculoskeletal: No lower extremity edema. 2/4 pulses in all 4 extremities. . Skin: No ulcerative lesions noted or rashes, . Psychiatry: Mood is  appropriate for condition and setting  Discharge Instructions You were cared for by a hospitalist during your hospital stay. If you have any questions about your discharge medications or the care you received while you were in the hospital after you are discharged, you can call the unit and asked to speak with the hospitalist on call if the hospitalist that took care of you is not available. Once you are discharged, your primary care physician will handle any further medical issues. Please note that NO REFILLS for any discharge medications will be authorized once you are discharged, as it is imperative that you return to your primary care physician (or establish a relationship with a primary care physician if you do not have one) for your aftercare needs so that they can reassess your need for medications and monitor your lab values.   Allergies as of 01/18/2019      Reactions   Ancef [cefazolin]    Cause rash shortness of breath and vomiting    Ceftin [cefuroxime] Nausea And Vomiting   Penicillins Hives   Has patient had a PCN reaction causing immediate rash, facial/tongue/throat swelling, SOB or lightheadedness with hypotension: Yes Has patient had a PCN reaction causing severe rash involving mucus membranes or skin necrosis:Yes Has patient had a PCN reaction that required hospitalization:No Has patient had a PCN reaction occurring within the last 10 years: No If all  of the above answers are "NO", then may proceed with Cephalosporin use.      Medication List    TAKE these medications   albuterol (2.5 MG/3ML) 0.083% nebulizer solution Commonly known as:  PROVENTIL Take 3 mLs (2.5 mg total) by nebulization 2 (two) times daily as needed for wheezing or shortness of breath.   ALPRAZolam 0.5 MG tablet Commonly known as:  XANAX Take 0.5 mg by mouth daily as needed for anxiety. for anxiety   aspirin 81 MG EC tablet Take 1 tablet (81 mg total) by mouth daily.   benzonatate 100 MG  capsule Commonly known as:  TESSALON Take 1 capsule (100 mg total) by mouth 3 (three) times daily as needed for cough.   buPROPion 300 MG 24 hr tablet Commonly known as:  WELLBUTRIN XL Take 300 mg by mouth daily.   ezetimibe 10 MG tablet Commonly known as:  ZETIA Take 1 tablet (10 mg total) by mouth daily.   folic acid 1 MG tablet Commonly known as:  FOLVITE Take 1 tablet (1 mg total) by mouth daily.   levofloxacin 750 MG tablet Commonly known as:  LEVAQUIN Take 1 tablet (750 mg total) by mouth daily for 1 day. What changed:    medication strength  how much to take   losartan 100 MG tablet Commonly known as:  COZAAR Take 100 mg by mouth daily.   metoprolol succinate 25 MG 24 hr tablet Commonly known as:  TOPROL-XL Take 25 mg by mouth daily.   phenol 1.4 % Liqd Commonly known as:  CHLORASEPTIC Use as directed 2 sprays in the mouth or throat as needed for throat irritation / pain.   predniSONE 20 MG tablet Commonly known as:  DELTASONE Take 2 tablets (40 mg total) by mouth daily with breakfast for 5 days.   rosuvastatin 20 MG tablet Commonly known as:  CRESTOR Take 1 tablet (20 mg total) by mouth daily at 6 PM.   triamterene-hydrochlorothiazide 37.5-25 MG tablet Commonly known as:  MAXZIDE-25 Take 1 tablet by mouth daily.   zolpidem 10 MG tablet Commonly known as:  AMBIEN Take 10 mg by mouth at bedtime as needed for sleep.            Durable Medical Equipment  (From admission, onward)         Start     Ordered   01/18/19 0950  For home use only DME Nebulizer machine  Once    Question:  Patient needs a nebulizer to treat with the following condition  Answer:  Acute exacerbation of chronic obstructive pulmonary disease (COPD) ([place])   01/18/19 0950         Allergies  Allergen Reactions  . Ancef [Cefazolin]     Cause rash shortness of breath and vomiting   . Ceftin [Cefuroxime] Nausea And Vomiting  . Penicillins Hives    Has patient had a PCN  reaction causing immediate rash, facial/tongue/throat swelling, SOB or lightheadedness with hypotension: Yes Has patient had a PCN reaction causing severe rash involving mucus membranes or skin necrosis:Yes Has patient had a PCN reaction that required hospitalization:No Has patient had a PCN reaction occurring within the last 10 years: No If all of the above answers are "NO", then may proceed with Cephalosporin use.    Follow-up Information    Derinda Late, MD. Call in 1 day(s).   Specialty:  Family Medicine Why:  Please call for post hospital follow-up appointment. Contact information: [place] [address]  [phone]        Laurin Coder, MD. Call in 1 day(s).   Specialty:  Pulmonary Disease Why:  Please call to schedule a post hospital follow-up appointment within 48 hours Contact information: [place] [address] [phone]            The results of significant diagnostics from this hospitalization (including imaging, microbiology, ancillary and laboratory) are listed below for reference.    Significant Diagnostic Studies: Dg Chest 2 View  Result Date: 01/15/2019 CLINICAL DATA:  Shortness of breath with wheezing EXAM: CHEST - 2 VIEW COMPARISON:  06/18/2018 FINDINGS: The heart size and mediastinal contours are within normal limits. Both lungs are clear. Mild degenerative changes of the spine. IMPRESSION: No active cardiopulmonary disease. Electronically Signed   By: Donavan Foil M.D.   On: 01/15/2019 02:09   Ct Angio Chest Pe W And/or Wo Contrast  Result Date: 01/15/2019 CLINICAL DATA:  Shortness of breath EXAM: CT ANGIOGRAPHY CHEST WITH CONTRAST TECHNIQUE: Multidetector CT imaging of the chest was performed using the standard protocol during bolus administration of intravenous contrast. Multiplanar CT image reconstructions and MIPs were obtained to evaluate the vascular anatomy. CONTRAST:  158mL ISOVUE-370  IOPAMIDOL (ISOVUE-370) INJECTION 76% COMPARISON:  Chest radiograph January 15, 2019 FINDINGS: Cardiovascular: There is no demonstrable pulmonary embolus. There is no thoracic aortic aneurysm or dissection. The visualized great vessels appear unremarkable. There is no pericardial effusion or pericardial thickening. Mediastinum/Nodes: Thyroid appears unremarkable. There is no appreciable thoracic adenopathy. No esophageal lesions are evident. Lungs/Pleura: There is no appreciable edema or consolidation. There is a 3 mm nodular opacity abutting the pleura in the anterior segment right upper lobe seen on axial slice 40 series 6. There is no pleural effusion or pleural thickening evident. Upper Abdomen: Visualized upper abdominal structures appear unremarkable. Musculoskeletal: There are breast implants bilaterally. There are no blastic or lytic bone lesions. No chest wall lesions are evident. Review of the MIP images confirms the above findings. IMPRESSION: 1. No demonstrable pulmonary embolus. No thoracic aortic aneurysm or dissection. 2. 3 mm nodular opacity abutting the pleura in the right upper lobe. No follow-up needed if patient is low-risk. Non-contrast chest CT can be considered in 12 months if patient is high-risk. This recommendation follows the consensus statement: Guidelines for Management of Incidental Pulmonary Nodules Detected on CT Images: From the Fleischner Society 2017; Radiology 2017; 284:228-243. No lung edema or consolidation. 3.  No appreciable thoracic adenopathy. Electronically Signed   By: Lowella Grip III M.D.   On: 01/15/2019 07:57    Microbiology: Recent Results (from the past 240 hour(s))  Respiratory Panel by PCR     Status: None   Collection Time: 01/15/19  6:11 AM  Result Value Ref Range Status   Adenovirus NOT DETECTED NOT DETECTED Final   Coronavirus 229E NOT DETECTED NOT DETECTED Final   Coronavirus HKU1 NOT DETECTED NOT DETECTED Final   Coronavirus NL63 NOT DETECTED  NOT DETECTED Final   Coronavirus OC43 NOT DETECTED NOT DETECTED Final   Metapneumovirus NOT DETECTED NOT DETECTED Final   Rhinovirus / Enterovirus NOT DETECTED NOT DETECTED Final   Influenza A NOT DETECTED NOT DETECTED Final   Influenza B NOT DETECTED NOT DETECTED Final   Parainfluenza Virus 1 NOT DETECTED NOT DETECTED Final   Parainfluenza Virus 2 NOT DETECTED NOT DETECTED Final   Parainfluenza Virus 3 NOT DETECTED NOT DETECTED Final   Parainfluenza Virus 4 NOT DETECTED NOT DETECTED Final  Respiratory Syncytial Virus NOT DETECTED NOT DETECTED Final   Bordetella pertussis NOT DETECTED NOT DETECTED Final   Chlamydophila pneumoniae NOT DETECTED NOT DETECTED Final   Mycoplasma pneumoniae NOT DETECTED NOT DETECTED Final    Comment: Performed at [facility], [place] [address], [place], [place] [address]  MRSA PCR Screening     Status: None   Collection Time: 01/15/19 12:00 PM  Result Value Ref Range Status   MRSA by PCR NEGATIVE NEGATIVE Final    Comment:        The GeneXpert MRSA Assay (FDA approved for NASAL specimens only), is one component of a comprehensive MRSA colonization surveillance program. It is not intended to diagnose MRSA infection nor to guide or monitor treatment for MRSA infections. Performed at [facility], [place] [address], [place], [place] [address]      Labs: Basic Metabolic Panel: Recent Labs  Lab 01/15/19 0454 01/16/19 0245 01/17/19 1046 01/18/19 0456  NA 142 137 139 139  K 3.5 4.1 4.2 4.4  CL 104 103 101 103  CO2 22 24 26 25   GLUCOSE 169* 163* 120* 145*  BUN 21* 18 36* 40*  CREATININE 0.97 0.90 1.07* 1.10*  CALCIUM 9.4 8.9 9.5 9.4  MG 2.3  --   --   --   PHOS 3.5  --   --   --    Liver Function Tests: Recent Labs  Lab 01/15/19 0454 01/16/19 0245  AST 25 18  ALT 21 18  ALKPHOS 71 62  BILITOT 0.9 0.4  PROT 8.3* 6.6  ALBUMIN 4.9 3.6   No results for input(s): LIPASE, AMYLASE in the last 168 hours. No results for  input(s): AMMONIA in the last 168 hours. CBC: Recent Labs  Lab 01/15/19 0454 01/16/19 0245 01/17/19 0309 01/18/19 0456  WBC 11.7* 10.9* 9.5 9.3  NEUTROABS 10.7* 9.9* 8.5* 8.0*  HGB 12.2 10.3* 10.1* 11.9*  HCT 37.8 32.4* 31.7* 37.4  MCV 100.8* 101.6* 102.3* 101.1*  PLT 283 274 237 273   Cardiac Enzymes: No results for input(s): CKTOTAL, CKMB, CKMBINDEX, TROPONINI in the last 168 hours. BNP: BNP (last 3 results) Recent Labs    01/15/19 0454  BNP 24.5    ProBNP (last 3 results) No results for input(s): PROBNP in the last 8760 hours.  CBG: Recent Labs  Lab 01/16/19 0835 01/16/19 1317 01/17/19 0747 01/17/19 1132 01/18/19 0723  GLUCAP 169* 139* 115* 139* 119*       Signed:  Kayleen Memos, MD [facility] 01/18/2019, 11:10 AM

## 2019-01-22 ENCOUNTER — Encounter: Payer: Self-pay | Admitting: Pulmonary Disease

## 2019-01-22 ENCOUNTER — Ambulatory Visit (INDEPENDENT_AMBULATORY_CARE_PROVIDER_SITE_OTHER): Payer: BLUE CROSS/BLUE SHIELD | Admitting: Pulmonary Disease

## 2019-01-22 VITALS — BP 110/82 | HR 81 | Ht 65.0 in | Wt 142.0 lb

## 2019-01-22 DIAGNOSIS — J40 Bronchitis, not specified as acute or chronic: Secondary | ICD-10-CM | POA: Diagnosis not present

## 2019-01-22 MED ORDER — CLARITHROMYCIN 500 MG PO TABS: 500.0000 mg | ORAL_TABLET | Freq: Two times a day (BID) | ORAL | 0 refills | Status: DC

## 2019-01-22 MED ORDER — PREDNISONE 10 MG PO TABS: 20.0000 mg | ORAL_TABLET | Freq: Every day | ORAL | 0 refills | Status: DC

## 2019-01-22 NOTE — Progress Notes (Signed)
[name]    [id number]    02-Nov-1961  Primary Care Physician:Blomgren, Collier [place]ina, MD  Referring Physician: Derinda Late, MD [address] [place], [place] [address]  Chief complaint:   Recent hospitalization for bronchitis  HPI:  Recently discharged on steroids and completion of antibiotics Completed course of Levaquin She continues to cough bringing up mucoid secretions Sometimes very thick sputum is been produced  Denies any chest pains or chest discomfort Has been not been having any fevers or chills  Cough and is significant enough to keep her up at night  Outpatient Encounter Medications as of 01/22/2019  Medication Sig  . albuterol (PROVENTIL) (2.5 MG/3ML) 0.083% nebulizer solution Take 3 mLs (2.5 mg total) by nebulization 2 (two) times daily as needed for wheezing or shortness of breath.  . ALPRAZolam (XANAX) 0.5 MG tablet Take 0.5 mg by mouth daily as needed for anxiety. for anxiety  . aspirin EC 81 MG EC tablet Take 1 tablet (81 mg total) by mouth daily.  . benzonatate (TESSALON) 100 MG capsule Take 1 capsule (100 mg total) by mouth 3 (three) times daily as needed for cough.  Marland Kitchen buPROPion (WELLBUTRIN XL) 300 MG 24 hr tablet Take 300 mg by mouth daily.  Marland Kitchen ezetimibe (ZETIA) 10 MG tablet Take 1 tablet (10 mg total) by mouth daily.  . folic acid (FOLVITE) 1 MG tablet Take 1 tablet (1 mg total) by mouth daily.  Marland Kitchen losartan (COZAAR) 100 MG tablet Take 100 mg by mouth daily.   . metoprolol succinate (TOPROL-XL) 25 MG 24 hr tablet Take 25 mg by mouth daily.  . phenol (CHLORASEPTIC) 1.4 % LIQD Use as directed 2 sprays in the mouth or throat as needed for throat irritation / pain.  . predniSONE (DELTASONE) 20 MG tablet Take 2 tablets (40 mg total) by mouth daily with breakfast for 5 days.  . rosuvastatin (CRESTOR) 20 MG tablet Take 1 tablet (20 mg total) by mouth daily at 6 PM.  . triamterene-hydrochlorothiazide (MAXZIDE-25) 37.5-25 MG tablet Take 1 tablet by  mouth daily.  Marland Kitchen zolpidem (AMBIEN) 10 MG tablet Take 10 mg by mouth at bedtime as needed for sleep.  . clarithromycin (BIAXIN) 500 MG tablet Take 1 tablet (500 mg total) by mouth 2 (two) times daily.  . predniSONE (DELTASONE) 10 MG tablet Take 2 tablets (20 mg total) by mouth daily with breakfast.   No facility-administered encounter medications on file as of 01/22/2019.     Allergies as of 01/22/2019 - Review Complete 01/22/2019  Allergen Reaction Noted  . Ancef [cefazolin]  08/28/2018  . Ceftin [cefuroxime] Nausea And Vomiting 03/17/2016  . Penicillins Hives 10/22/2011    Past Medical History:  Diagnosis Date  . Adenomatous polyps   . Anemia   . Blood transfusion without reported diagnosis 01/1999   following hysterectomy  . Bulging discs   . Endometriosis   . Hyperlipidemia   . Hypertension   . PVC (premature ventricular contraction)   . Urinary incontinence     Past Surgical History:  Procedure Laterality Date  . 5 back surgeries    . ABDOMINAL HYSTERECTOMY  2001   TAH/RSO  . AUGMENTATION MAMMAPLASTY Bilateral 2013   saline. pre pectoral  . BLADDER SUSPENSION  2003   LSO and rectocele repair at [facility]  . BREAST SURGERY     breast augmentation--saline implants  . HERNIA REPAIR    . LUMBAR FUSION  2010   Dr. Carloyn Manner  . REDUCTION  MAMMAPLASTY Bilateral   . SPINE SURGERY      Family History  Problem Relation Age of Onset  . Hypertension Mother   . Hypertension Father   . Cancer Father   . Hyperlipidemia Sister   . Hypertension Sister   . Hyperlipidemia Brother   . Hypertension Brother   . Hypertension Brother   . Hypertension Brother   . Cancer Maternal Uncle 72       colon ca  . Stroke Maternal Uncle   . Breast cancer Maternal Grandmother   . Cancer Maternal Grandmother 40       colon ca  . Stroke Maternal Aunt   . Stroke Paternal Grandfather     Social History   Socioeconomic History  . Marital status: Divorced    Spouse name: Not on file  . Number of  children: 3  . Years of education: Not on file  . Highest education level: Not on file  Occupational History  . Not on file  Social Needs  . Financial resource strain: Not on file  . Food insecurity:    Worry: Not on file    Inability: Not on file  . Transportation needs:    Medical: Not on file    Non-medical: Not on file  Tobacco Use  . Smoking status: Former Smoker    Types: Cigarettes  . Smokeless tobacco: Never Used  Substance and Sexual Activity  . Alcohol use: Yes    Alcohol/week: 6.0 - 8.0 standard drinks    Types: 6 - 8 Standard drinks or equivalent per week    Comment: occasionally   . Drug use: Yes    Types: Marijuana    Comment: occassionally  . Sexual activity: Yes    Partners: Male    Birth control/protection: Surgical    Comment: TAH  Lifestyle  . Physical activity:    Days per week: Not on file    Minutes per session: Not on file  . Stress: Not on file  Relationships  . Social connections:    Talks on phone: Not on file    Gets together: Not on file    Attends religious service: Not on file    Active member of club or organization: Not on file    Attends meetings of clubs or organizations: Not on file    Relationship status: Not on file  . Intimate partner violence:    Fear of current or ex partner: Not on file    Emotionally abused: Not on file    Physically abused: Not on file    Forced sexual activity: Not on file  Other Topics Concern  . Not on file  Social History Narrative  . Not on file    Review of Systems  Constitutional: Negative.   HENT: Negative.   Eyes: Negative.   Respiratory: Positive for cough and shortness of breath.   Cardiovascular: Negative.   Gastrointestinal: Negative.   Endocrine: Negative.     Vitals:   01/22/19 1505  BP: 110/82  Pulse: 81  SpO2: 97%     Physical Exam  Constitutional: She appears well-developed and well-nourished.  HENT:  Head: Normocephalic and atraumatic.  Eyes: Pupils are equal,  round, and reactive to light. Conjunctivae and EOM are normal. Right eye exhibits no discharge. Left eye exhibits no discharge.  Neck: Normal range of motion. Neck supple. No tracheal deviation present. No thyromegaly present.  Cardiovascular: Normal rate and regular rhythm.  Pulmonary/Chest: Effort normal and breath sounds normal. No  respiratory distress. She has no wheezes. She has no rales.  Abdominal: Soft. Bowel sounds are normal. She exhibits no distension. There is no abdominal tenderness.   Data Reviewed: Recent hospital records reviewed CT scan of the chest reviewed with the patient  Assessment:  Non-resolving bronchitis  Shortness of breath with protracted cough  Plan/Recommendations: Sputum for Gram stain and cultures Continue prednisone reduced to 20 mg daily  Will give her a course of Biaxin 500 p.o. twice daily for 10 days  Continue to use expectorants to help clear secretions  Encouraged to call if any significant concerns    Sherrilyn Rist MD [facility] 01/22/2019, 3:27 PM  CC: Derinda Late, MD

## 2019-01-22 NOTE — Patient Instructions (Signed)
Non-resolving bronchitis  Steroids Course of Biaxin Continue with expectorants-Mucinex should suffice, may combine with expectorant with codeine in the evening to help you sleep  I will see you back in the office in about 6 weeks Call with any significant concerns

## 2019-01-23 ENCOUNTER — Ambulatory Visit: Payer: BLUE CROSS/BLUE SHIELD | Admitting: Pulmonary Disease

## 2019-01-23 ENCOUNTER — Other Ambulatory Visit: Payer: Self-pay | Admitting: Emergency Medicine

## 2019-01-23 DIAGNOSIS — J40 Bronchitis, not specified as acute or chronic: Secondary | ICD-10-CM

## 2019-01-26 LAB — RESPIRATORY CULTURE OR RESPIRATORY AND SPUTUM CULTURE

## 2019-02-06 ENCOUNTER — Ambulatory Visit
Admission: RE | Admit: 2019-02-06 | Discharge: 2019-02-06 | Disposition: A | Discharge status: Home / Self Care | Payer: BLUE CROSS/BLUE SHIELD | Source: Ambulatory Visit | Attending: Family Medicine | Admitting: Family Medicine

## 2019-02-06 ENCOUNTER — Other Ambulatory Visit: Payer: Self-pay | Admitting: Family Medicine

## 2019-02-06 DIAGNOSIS — R059 Cough, unspecified: Secondary | ICD-10-CM

## 2019-02-06 DIAGNOSIS — R05 Cough: Secondary | ICD-10-CM

## 2019-03-09 ENCOUNTER — Other Ambulatory Visit: Payer: Self-pay | Admitting: Family Medicine

## 2019-03-09 ENCOUNTER — Other Ambulatory Visit: Payer: Self-pay

## 2019-03-09 ENCOUNTER — Ambulatory Visit
Admission: RE | Admit: 2019-03-09 | Discharge: 2019-03-09 | Disposition: A | Discharge status: Home / Self Care | Payer: BLUE CROSS/BLUE SHIELD | Source: Ambulatory Visit | Attending: Family Medicine | Admitting: Family Medicine

## 2019-03-09 DIAGNOSIS — R0602 Shortness of breath: Secondary | ICD-10-CM

## 2019-03-23 ENCOUNTER — Ambulatory Visit: Payer: Self-pay | Admitting: Pulmonary Disease

## 2019-09-07 ENCOUNTER — Other Ambulatory Visit: Payer: Self-pay | Admitting: Obstetrics and Gynecology

## 2019-09-07 DIAGNOSIS — Z1231 Encounter for screening mammogram for malignant neoplasm of breast: Secondary | ICD-10-CM

## 2019-09-11 ENCOUNTER — Other Ambulatory Visit: Payer: Self-pay

## 2019-09-15 ENCOUNTER — Encounter: Payer: Self-pay | Admitting: Obstetrics and Gynecology

## 2019-09-15 ENCOUNTER — Ambulatory Visit (INDEPENDENT_AMBULATORY_CARE_PROVIDER_SITE_OTHER): Payer: BC Managed Care – PPO | Admitting: Obstetrics and Gynecology

## 2019-09-15 ENCOUNTER — Other Ambulatory Visit: Payer: Self-pay

## 2019-09-15 VITALS — BP 116/62 | HR 80 | Temp 97.1°F | Resp 14 | Ht 63.0 in | Wt 139.0 lb

## 2019-09-15 DIAGNOSIS — Z01419 Encounter for gynecological examination (general) (routine) without abnormal findings: Secondary | ICD-10-CM

## 2019-09-15 DIAGNOSIS — N3946 Mixed incontinence: Secondary | ICD-10-CM | POA: Diagnosis not present

## 2019-09-15 NOTE — Patient Instructions (Signed)

## 2019-09-15 NOTE — Progress Notes (Signed)
58 y.o. [id number] Divorced Caucasian female here for annual exam.    Hospitalized for respiratory failure in January, 2020.  She now has a dx of asthma.   Patient has also had a stroke.  No residual effect.  She is now off estrogen. Still has some hot flashes.   Still with urinary incontinence.  Up twice a night.  Wearing a pad at work due to urgency and frequency. Declines therapy.  No pills and no surgery.   Flying to [place] to see mother for her 85[age] birthday.   PCP: Derinda Late, MD    No LMP recorded. Patient has had a hysterectomy.           Sexually active: No.  The current method of family planning is status post hysterectomy.    Exercising: No.  The patient does not participate in regular exercise at present. Smoker:  no  Health Maintenance: Pap:  2013 Normal History of abnormal Pap:   Yes, Hx of cryotherapy to cervix in 1995 MMG:  08/28/18 BIRADS 1 negative/density b -- scheduled 10/20/19 Colonoscopy: 10-24-15 Dr.Outlaw.  Doing every 5 years BMD:   2011  Result  Normal TDaP: 7 years ago per patient  Gardasil:   n/a HIV: 01/16/19 Negative Hep C: 04/27/13 Negative Screening Labs: PCP   reports that she has quit smoking. Her smoking use included cigarettes. She has never used smokeless tobacco. She reports current alcohol use of about 6.0 - 8.0 standard drinks of alcohol per week. She reports current drug use. Drug: Marijuana.  Past Medical History:  Diagnosis Date  . Adenomatous polyps   . Anemia   . Blood transfusion without reported diagnosis 01/1999   following hysterectomy  . Bulging discs   . Endometriosis   . Hx of respiratory failure   . Hyperlipidemia   . Hypertension   . PVC (premature ventricular contraction)   . Stroke ([place])   . Urinary incontinence     Past Surgical History:  Procedure Laterality Date  . 5 back surgeries    . ABDOMINAL HYSTERECTOMY  2001   TAH/RSO  . AUGMENTATION MAMMAPLASTY Bilateral 2013   saline. pre pectoral  . BLADDER  SUSPENSION  2003   LSO and rect[facility]cele repair at [facility]  . BREAST SURGERY     breast augmentation--saline implants  . HERNIA REPAIR    . LUMBAR FUSION  2010   Dr. Carloyn Manner  . REDUCTION MAMMAPLASTY Bilateral   . SPINAL FUSION  2018   Dr. Ellene Route  . SPINE SURGERY      Current Outpatient Medications  Medication Sig Dispense Refill  . ALPRAZolam (XANAX) 0.5 MG tablet Take 0.5 mg by mouth daily as needed for anxiety. for anxiety  1  . buPROPion (WELLBUTRIN XL) 300 MG 24 hr tablet Take 300 mg by mouth daily.    Marland Kitchen ezetimibe (ZETIA) 10 MG tablet Take 1 tablet (10 mg total) by mouth daily. 30 tablet 4  . folic acid (FOLVITE) 1 MG tablet Take 1 tablet (1 mg total) by mouth daily. 30 tablet 0  . losartan (COZAAR) 100 MG tablet Take 100 mg by mouth daily.     . metoprolol succinate (TOPROL-XL) 25 MG 24 hr tablet Take 25 mg by mouth daily.    . rosuvastatin (CRESTOR) 20 MG tablet Take 1 tablet (20 mg total) by mouth daily at 6 PM. 30 tablet 11  . triamterene-hydrochlorothiazide (MAXZIDE-25) 37.5-25 MG tablet Take 1 tablet by mouth daily.    Marland Kitchen zolpidem (AMBIEN) 10 MG tablet  Take 10 mg by mouth at bedtime as needed for sleep.    Marland Kitchen albuterol (PROVENTIL) (2.5 MG/3ML) 0.083% nebulizer solution Take 3 mLs (2.5 mg total) by nebulization 2 (two) times daily as needed for wheezing or shortness of breath. (Patient not taking: Reported on 09/15/2019) 75 mL 0   No current facility-administered medications for this visit.     Family History  Problem Relation Age of Onset  . Hypertension Mother   . Stroke Mother   . Hypertension Father   . Cancer Father   . Hyperlipidemia Sister   . Hypertension Sister   . Hyperlipidemia Brother   . Hypertension Brother   . Hypertension Brother   . Hypertension Brother   . Cancer Maternal Uncle 72       colon ca  . Stroke Maternal Uncle   . Breast cancer Maternal Grandmother   . Cancer Maternal Grandmother 72       colon ca  . Stroke Maternal Aunt   . Stroke Paternal  Grandfather     Review of Systems  Constitutional: Negative.   HENT: Negative.   Eyes: Negative.   Respiratory: Negative.   Cardiovascular: Negative.   Gastrointestinal: Negative.   Endocrine: Negative.   Genitourinary: Negative.   Musculoskeletal: Negative.   Skin: Negative.   Allergic/Immunologic: Negative.   Neurological: Negative.   Hematological: Negative.   Psychiatric/Behavioral: Negative.     Exam:   BP 116/62 (BP Location: Right Arm, Patient Position: Sitting, Cuff Size: Normal)   Pulse 80   Temp (!) 97.1 F (36.2 C) (Temporal)   Resp 14   Ht 5\' 3"  (1.6 m)   Wt 139 lb (63 kg)   BMI 24.62 kg/m     General appearance: alert, cooperative and appears stated age Head: normocephalic, without obvious abnormality, atraumatic Neck: no adenopathy, supple, symmetrical, trachea midline and thyroid normal to inspection and palpation Lungs: clear to auscultation bilaterally Breasts: bilateral implants, no masses or tenderness, No nipple retraction or dimpling, No nipple discharge or bleeding, No axillary adenopathy Heart: regular rate and rhythm Abdomen: soft, non-tender; no masses, no organomegaly Extremities: extremities normal, atraumatic, no cyanosis or edema Skin: skin color, texture, turgor normal. No rashes or lesions Lymph nodes: cervical, supraclavicular, and axillary nodes normal. Neurologic: grossly normal  Pelvic: External genitalia:  no lesions              No abnormal inguinal nodes palpated.              Urethra:  normal appearing urethra with no masses, tenderness or lesions              Bartholins and Skenes: normal                 Vagina: normal appearing vagina with normal color and discharge, no lesions              Cervix: absent              Pap taken: No. Bimanual Exam:  Uterus:   absent              Adnexa: no mass, fullness, tenderness              Rectal exam: Yes.  .  Confirms.              Anus:  normal sphincter tone, no lesions  Chaperone  was present for exam.  Assessment:   Well woman visit with normal exam. Status post TAH/RSO. Remote  hx abnormal paps. Status post LSO/sacrocolpopexy.  Status post midurethral sling.   Urinary urgency and frequency.  Stress incontinence. Bilateral breast implants. Hx stroke.  Hx respiratory failure.  Plan: Mammogram screening discussed. Self breast awareness reviewed. Pap and HR HPV as above. Guidelines for Calcium, Vitamin D, regular exercise program including cardiovascular and weight bearing exercise. Referral to [facility].  Labs with PCP.  Flu vaccine recommended.  Follow up annually and prn.   After visit summary provided.

## 2019-10-20 ENCOUNTER — Other Ambulatory Visit: Payer: Self-pay

## 2019-10-20 ENCOUNTER — Ambulatory Visit
Admission: RE | Admit: 2019-10-20 | Discharge: 2019-10-20 | Disposition: A | Discharge status: Home / Self Care | Payer: BC Managed Care – PPO | Source: Ambulatory Visit | Attending: Obstetrics and Gynecology | Admitting: Obstetrics and Gynecology

## 2019-10-20 DIAGNOSIS — Z1231 Encounter for screening mammogram for malignant neoplasm of breast: Secondary | ICD-10-CM

## 2019-11-04 DIAGNOSIS — M51369 Other intervertebral disc degeneration, lumbar region without mention of lumbar back pain or lower extremity pain: Secondary | ICD-10-CM | POA: Insufficient documentation

## 2019-11-26 ENCOUNTER — Other Ambulatory Visit: Payer: Self-pay | Admitting: Family Medicine

## 2019-11-26 DIAGNOSIS — R911 Solitary pulmonary nodule: Secondary | ICD-10-CM

## 2019-12-04 ENCOUNTER — Ambulatory Visit
Admission: RE | Admit: 2019-12-04 | Discharge: 2019-12-04 | Disposition: A | Discharge status: Home / Self Care | Payer: BC Managed Care – PPO | Source: Ambulatory Visit | Attending: Family Medicine | Admitting: Family Medicine

## 2019-12-04 DIAGNOSIS — R911 Solitary pulmonary nodule: Secondary | ICD-10-CM

## 2020-01-04 ENCOUNTER — Emergency Department (HOSPITAL_COMMUNITY)
Admission: EM | Admit: 2020-01-04 | Discharge: 2020-01-05 | Disposition: A | Discharge status: Home / Self Care | Payer: BC Managed Care – PPO | Attending: Emergency Medicine | Admitting: Emergency Medicine

## 2020-01-04 DIAGNOSIS — I10 Essential (primary) hypertension: Secondary | ICD-10-CM | POA: Insufficient documentation

## 2020-01-04 DIAGNOSIS — R519 Headache, unspecified: Secondary | ICD-10-CM | POA: Insufficient documentation

## 2020-01-04 DIAGNOSIS — Z8673 Personal history of transient ischemic attack (TIA), and cerebral infarction without residual deficits: Secondary | ICD-10-CM | POA: Diagnosis not present

## 2020-01-04 DIAGNOSIS — R682 Dry mouth, unspecified: Secondary | ICD-10-CM | POA: Diagnosis not present

## 2020-01-04 DIAGNOSIS — Z20822 Contact with and (suspected) exposure to covid-19: Secondary | ICD-10-CM | POA: Diagnosis not present

## 2020-01-04 DIAGNOSIS — R002 Palpitations: Secondary | ICD-10-CM | POA: Insufficient documentation

## 2020-01-04 DIAGNOSIS — H538 Other visual disturbances: Secondary | ICD-10-CM | POA: Diagnosis not present

## 2020-01-04 DIAGNOSIS — F1210 Cannabis abuse, uncomplicated: Secondary | ICD-10-CM | POA: Insufficient documentation

## 2020-01-04 DIAGNOSIS — R112 Nausea with vomiting, unspecified: Secondary | ICD-10-CM | POA: Diagnosis not present

## 2020-01-04 DIAGNOSIS — T50901A Poisoning by unspecified drugs, medicaments and biological substances, accidental (unintentional), initial encounter: Secondary | ICD-10-CM

## 2020-01-04 DIAGNOSIS — Z87891 Personal history of nicotine dependence: Secondary | ICD-10-CM | POA: Insufficient documentation

## 2020-01-04 DIAGNOSIS — R42 Dizziness and giddiness: Secondary | ICD-10-CM | POA: Insufficient documentation

## 2020-01-04 NOTE — ED Triage Notes (Signed)
Pt presents to the ED by EMS for accidental overdose. Pt reports taking one 450 mg welbutrin ER this am. Pt reports accidentally taking a second one at noon today totaling 900mg . Pt reports that she may have accidentally taken a second dose of losartan totaling 200mg  today. Pt states that she may have taken a second metoprolol totaling 50mg . Pt reports that she began to feel off this evening and has an intermittent pounding sensation in her chest. Pt reports that her head feels foggy. Pt reports calling poison control then called EMS. 182/94. CBG 132 by EMS.

## 2020-01-05 ENCOUNTER — Other Ambulatory Visit: Payer: Self-pay

## 2020-01-05 LAB — CBC
HCT: 36.5 % (ref 36.0–46.0)
Hemoglobin: 11.9 g/dL — ABNORMAL LOW (ref 12.0–15.0)
MCH: 33.5 pg (ref 26.0–34.0)
MCHC: 32.6 g/dL (ref 30.0–36.0)
MCV: 102.8 fL — ABNORMAL HIGH (ref 80.0–100.0)
Platelets: 260 10*3/uL (ref 150–400)
RBC: 3.55 MIL/uL — ABNORMAL LOW (ref 3.87–5.11)
RDW: 12.6 % (ref 11.5–15.5)
WBC: 6 10*3/uL (ref 4.0–10.5)
nRBC: 0 % (ref 0.0–0.2)

## 2020-01-05 LAB — RAPID URINE DRUG SCREEN, HOSP PERFORMED
Amphetamines: NOT DETECTED
Barbiturates: NOT DETECTED
Benzodiazepines: NOT DETECTED
Cocaine: NOT DETECTED
Opiates: NOT DETECTED
Tetrahydrocannabinol: NOT DETECTED

## 2020-01-05 LAB — BASIC METABOLIC PANEL
Anion gap: 9 (ref 5–15)
BUN: 20 mg/dL (ref 6–20)
CO2: 26 mmol/L (ref 22–32)
Calcium: 9.1 mg/dL (ref 8.9–10.3)
Chloride: 98 mmol/L (ref 98–111)
Creatinine, Ser: 1.25 mg/dL — ABNORMAL HIGH (ref 0.44–1.00)
GFR calc Af Amer: 55 mL/min — ABNORMAL LOW (ref >60)
GFR calc non Af Amer: 47 mL/min — ABNORMAL LOW (ref >60)
Glucose, Bld: 98 mg/dL (ref 70–99)
Potassium: 3.7 mmol/L (ref 3.5–5.1)
Sodium: 133 mmol/L — ABNORMAL LOW (ref 135–145)

## 2020-01-05 LAB — COMPREHENSIVE METABOLIC PANEL
ALT: 18 U/L (ref 0–44)
AST: 22 U/L (ref 15–41)
Albumin: 4.2 g/dL (ref 3.5–5.0)
Alkaline Phosphatase: 62 U/L (ref 38–126)
Anion gap: 13 (ref 5–15)
BUN: 23 mg/dL — ABNORMAL HIGH (ref 6–20)
CO2: 23 mmol/L (ref 22–32)
Calcium: 9.6 mg/dL (ref 8.9–10.3)
Chloride: 93 mmol/L — ABNORMAL LOW (ref 98–111)
Creatinine, Ser: 1.32 mg/dL — ABNORMAL HIGH (ref 0.44–1.00)
GFR calc Af Amer: 51 mL/min — ABNORMAL LOW (ref >60)
GFR calc non Af Amer: 44 mL/min — ABNORMAL LOW (ref >60)
Glucose, Bld: 112 mg/dL — ABNORMAL HIGH (ref 70–99)
Potassium: 3.7 mmol/L (ref 3.5–5.1)
Sodium: 129 mmol/L — ABNORMAL LOW (ref 135–145)
Total Bilirubin: 1 mg/dL (ref 0.3–1.2)
Total Protein: 6.8 g/dL (ref 6.5–8.1)

## 2020-01-05 LAB — TROPONIN I (HIGH SENSITIVITY)
Troponin I (High Sensitivity): <2 ng/L (ref <18)
Troponin I (High Sensitivity): <2 ng/L (ref <18)

## 2020-01-05 LAB — ETHANOL: Alcohol, Ethyl (B): <10 mg/dL (ref <10)

## 2020-01-05 LAB — ACETAMINOPHEN LEVEL: Acetaminophen (Tylenol), Serum: <10 ug/mL — ABNORMAL LOW (ref 10–30)

## 2020-01-05 LAB — SALICYLATE LEVEL: Salicylate Lvl: <7 mg/dL — ABNORMAL LOW (ref 7.0–30.0)

## 2020-01-05 LAB — SARS CORONAVIRUS 2 (TAT 6-24 HRS): SARS Coronavirus 2: NEGATIVE

## 2020-01-05 MED ORDER — LACTATED RINGERS IV BOLUS
1000.0000 mL | Freq: Once | INTRAVENOUS | Status: AC
  Administered 2020-01-05: 1000 mL via INTRAVENOUS

## 2020-01-05 MED ORDER — ONDANSETRON HCL 4 MG/2ML IJ SOLN
4.0000 mg | Freq: Once | INTRAMUSCULAR | Status: AC
  Administered 2020-01-05: 4 mg via INTRAVENOUS
  Filled 2020-01-05: qty 2

## 2020-01-05 MED ORDER — LORAZEPAM 2 MG/ML IJ SOLN
1.0000 mg | INTRAMUSCULAR | Status: AC | PRN
  Administered 2020-01-05 (×2): 1 mg via INTRAVENOUS
  Filled 2020-01-05 (×2): qty 1

## 2020-01-05 MED ORDER — KETOROLAC TROMETHAMINE 30 MG/ML IJ SOLN
15.0000 mg | Freq: Once | INTRAMUSCULAR | Status: AC
  Administered 2020-01-05: 15 mg via INTRAVENOUS
  Filled 2020-01-05: qty 1

## 2020-01-05 NOTE — ED Notes (Signed)
Pt is tearful and tremulous. Re-assured that she will be monitored.

## 2020-01-05 NOTE — Discharge Instructions (Addendum)
Continue medications as previously prescribed.  Return to the emergency department if your symptoms significantly worsen or change. 

## 2020-01-05 NOTE — ED Provider Notes (Signed)
Patient's observation time is up and she is feeling considerably improved.  At this point I feel as though she is stable for discharge.  Her heart rate was 79 while I was speaking with her.  Patient is to return as needed for any problems.   Veryl Speak, MD 01/05/20 1114

## 2020-01-05 NOTE — ED Provider Notes (Signed)
1  Emergency Department Provider Note   I have reviewed the triage vital signs and the nursing notes.   HISTORY  Chief Complaint Drug Overdose   HPI [name] is a 59 y.o. female with medical history as documented below who presents to the emergency department today for accidentally ingesting double of her Wellbutrin, Toprol, Maxide and losartan.  Patient states that she took it normally this morning but she got mixed up by the packaging and thought maybe she had to take it so she took another dose around noon.  A couple hours later started developing a headache and nausea and that when she realized that she probably took it twice.  She had a couple episodes of vomiting and called poison control.  She was told to self monitor however symptoms got worse and she started feeling fuzzy headed, blurry vision, unsteadiness on her feet, palpitations, dry mouth.  She started drinking a bunch of water but nothing improve so she presented here for further evaluation.  There is no suicidal intent.  Patient is embarrassed about her mistake. No abdominal pain. No fever or recent illnesses.    No other associated or modifying symptoms.    Past Medical History:  Diagnosis Date  . Adenomatous polyps   . Anemia   . Blood transfusion without reported diagnosis 01/1999   following hysterectomy  . Bulging discs   . Endometriosis   . Hx of respiratory failure   . Hyperlipidemia   . Hypertension   . PVC (premature ventricular contraction)   . Stroke ([place])   . Urinary incontinence     Patient Active Problem List   Diagnosis Date Noted  . Acute respiratory failure with hypoxia ([place]) 01/16/2019  . Acute respiratory failure with hypoxemia ([place]) 01/15/2019  . Hyperglycemia 01/15/2019  . Pulmonary nodule 01/15/2019  . Stroke (cerebrum) ([place]) 06/18/2018  . Adjustment disorder with depressed mood 07/01/2017  . Lumbar stenosis with neurogenic claudication 02/09/2017  . Lumbar stenosis 02/09/2017    . Mixed incontinence 08/05/2014  . HTN (hypertension) 02/07/2012  . Depression 02/07/2012  . Dyslipidemia 02/07/2012  . Menopausal and perimenopausal disorder 02/07/2012    Past Surgical History:  Procedure Laterality Date  . 5 back surgeries    . ABDOMINAL HYSTERECTOMY  2001   TAH/RSO  . AUGMENTATION MAMMAPLASTY Bilateral 2013   saline. pre pectoral  . BLADDER SUSPENSION  2003   [facility]O and [facility]cele repair at [facility]  . BREAST SURGERY     breast augmentation--saline implants  . HERNIA REPAIR    . LUMBAR FUSION  2010   Dr. Carloyn Manner  . REDUCTION MAMMAPLASTY Bilateral   . SPINAL FUSION  2018   Dr. Ellene Route  . SPINE SURGERY      Current Outpatient Rx  . Order #: [id number] Class: Normal  . Order #: [id number] Class: Historical Med  . Order #: [id number] Class: Historical Med  . Order #: [id number] Class: Normal  . Order #: [id number] Class: Normal  . Order #: [id number] Class: Historical Med  . Order #: [id number] Class: Historical Med  . Order #: [id number] Class: Normal  . Order #: [id number] Class: Historical Med  . Order #: [id number] Class: Historical Med    Allergies Ancef [cefazolin], Ceftin [cefuroxime], and Penicillins  Family History  Problem Relation Age of Onset  . Hypertension Mother   . Stroke Mother   . Hypertension Father   . Cancer Father   . Hyperlipidemia Sister   . Hypertension Sister   . Hyperlipidemia Brother   . Hypertension Brother   . Hypertension  Brother   . Hypertension Brother   . Cancer Maternal Uncle 72       colon ca  . Stroke Maternal Uncle   . Breast cancer Maternal Grandmother   . Cancer Maternal Grandmother 73       colon ca  . Stroke Maternal Aunt   . Stroke Paternal Grandfather     Social History Social History   Tobacco Use  . Smoking status: Former Smoker    Types: Cigarettes  . Smokeless tobacco: Never Used  Substance Use Topics  . Alcohol use: Yes    Alcohol/week: 6.0 - 8.0 standard drinks    Types: 6 - 8 Standard drinks or equivalent  per week    Comment: occasionally   . Drug use: Yes    Types: Marijuana    Comment: occassionally    Review of Systems  All other systems negative except as documented in the HPI. All pertinent positives and negatives as reviewed in the HPI. ____________________________________________   PHYSICAL EXAM:  VITAL SIGNS: Vitals:   01/05/20 0005 01/05/20 0007  BP: (!) 178/94   Pulse: 89   Resp: 17   Temp: 98.1 F (36.7 C)   TempSrc: Oral   SpO2: 100%   Weight:  59.9 kg  Height:  5\' 5"  (1.651 m)     Constitutional: Alert and oriented. Well appearing and in no acute distress. Eyes: Conjunctivae are normal. PERRL (approx 70mm). EOMI. Head: Atraumatic. Nose: No congestion/rhinnorhea. Mouth/Throat: Mucous membranes are moist.  Oropharynx non-erythematous. Neck: No stridor.  No meningeal signs.   Cardiovascular: Normal rate, regular rhythm. Good peripheral circulation. Grossly normal heart sounds.   Respiratory: Normal respiratory effort.  No retractions. Lungs CTAB. Gastrointestinal: Soft and nontender. No distention.  Musculoskeletal: No lower extremity tenderness nor edema. No gross deformities of extremities. Neurologic:  No altered mental status, able to give full seemingly accurate history.  Face is symmetric, EOM's intact, pupils equal and reactive, vision intact, tongue and uvula midline without deviation. Upper and Lower extremity motor 5/5, intact pain perception in distal extremities, no clonus in achilles. Able to perform finger to nose normal with both hands.  Skin:  Skin is warm, dry and intact. No rash noted.   ____________________________________________   LABS (all labs ordered are listed, but only abnormal results are displayed)  Labs Reviewed  COMPREHENSIVE METABOLIC PANEL  ETHANOL  SALICYLATE LEVEL  ACETAMINOPHEN LEVEL  CBC  RAPID URINE DRUG SCREEN, HOSP PERFORMED  TROPONIN I (HIGH SENSITIVITY)    ____________________________________________  EKG   EKG Interpretation  Date/Time:  Tuesday January 05 2020 00:04:18 EST Ventricular Rate:  84 PR Interval:    QRS Duration: 88 QT Interval:  391 QTC Calculation: 463 R Axis:   97 Text Interpretation: Sinus rhythm Probable lateral infarct, age indeterminate Anteroseptal infarct, age indeterminate slower rate than last year similar intervals to last  year No acute changes Confirmed by Merrily Pew [phone]) on 01/05/2020 12:42:15 AM       EKG Interpretation  Date/Time:  Tuesday January 05 2020 00:04:18 EST Ventricular Rate:  84 PR Interval:    QRS Duration: 88 QT Interval:  391 QTC Calculation: 463 R Axis:   97 Text Interpretation: Sinus rhythm Probable lateral infarct, age indeterminate Anteroseptal infarct, age indeterminate slower rate than last year similar intervals to last  year No acute changes Confirmed by Merrily Pew [phone]) on 01/05/2020 12:42:15 AM        ____________________________________________  RADIOLOGY  No results found.  ____________________________________________   PROCEDURES  Procedure(s) performed:   .Critical Care Performed by: Merrily Pew, MD Authorized by: Merrily Pew, MD   Critical care provider statement:    Critical care time (minutes):  45   Critical care was necessary to treat or prevent imminent or life-threatening deterioration of the following conditions:  Toxidrome   Critical care was time spent personally by me on the following activities:  Discussions with consultants, evaluation of patient's response to treatment, examination of patient, ordering and performing treatments and interventions, ordering and review of laboratory studies, ordering and review of radiographic studies, pulse oximetry, re-evaluation of patient's condition, obtaining history from patient or surrogate and review of old charts     ____________________________________________   INITIAL IMPRESSION  / [place] / ED COURSE  Patient here with accidental overdose of her Wellbutrin, Toprol, losartan and Maxide.  Patient is symptomatic most likely from the Wellbutrin with sounds like a combination of sympathomimetic/anticholinergic toxidrome.  Ramer been contacted.  Will offer the patient fluids, Ativan, Zofran and Toradol for her symptoms.  Supportive care for 12 hours.  Repeat EKGs as warranted.  Repeat EKG and repeat labs showed no significant changes and slight improvement in her kidney function deftly not worsening.  Patient is symptom-free at time of transfer of care.  I discussed with the patient hold the medicine until the next day.  She will still need to be observed till noon and no EKG done at that time and if stable can be discharged at that time.     Pertinent labs & imaging results that were available during my care of the patient were reviewed by me and considered in my medical decision making (see chart for details).  ____________________________________________  FINAL CLINICAL IMPRESSION(S) / ED DIAGNOSES  Final diagnoses:  None     MEDICATIONS GIVEN DURING THIS VISIT:  Medications - No data to display   NEW OUTPATIENT MEDICATIONS STARTED DURING THIS VISIT:  New Prescriptions   No medications on file    Note:  This note was prepared with assistance of Dragon voice recognition software. Occasional wrong-word or sound-a-like substitutions may have occurred due to the inherent limitations of voice recognition software.   Tejasvi Brissett, Corene Cornea, MD 01/06/20 [phone]

## 2020-01-05 NOTE — ED Notes (Signed)
Pt walked to the bathroom with assistance. Pt c/o feeling "foggy". Attempted to collect a urine specimen but was unsuccessful (pt missed the toilet hat). Will try again later.

## 2020-01-05 NOTE — ED Notes (Signed)
Patient ambulated to bathroom with assistance. Pt gait remains mildly unsteady (pt appears dizzy). Collected Ua. Will continue to monitor.

## 2020-01-05 NOTE — ED Notes (Signed)
Pt verbalized understanding of discharge paperwork and follow-up care. Friend to pick up pt. Ambulatory upon discharge

## 2020-03-08 IMAGING — CT CT ANGIO CHEST
2 of 6 series · 18 of 46 positions shown · IV contrast (iopamidol)
Comparison: Chest radiograph January 15, 2019

CLINICAL DATA: Shortness of breath

EXAM:
CT ANGIOGRAPHY CHEST WITH CONTRAST
TECHNIQUE: Multidetector CT imaging of the chest was performed using the
standard protocol during bolus administration of intravenous
contrast. Multiplanar CT image reconstructions and MIPs were
obtained to evaluate the vascular anatomy.
CONTRAST:  100mL [id number] IOPAMIDOL (8WF38U-T80) INJECTION 76%

[Series 5: thins · axial · 0.63mm/px · z∈[-270,-17]mm · 15 of 279 slices shown]
[im 13/279  lung]
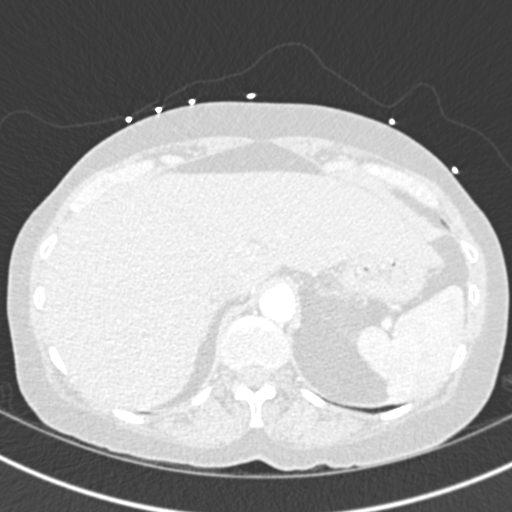
[im 37/279  soft-tissue]
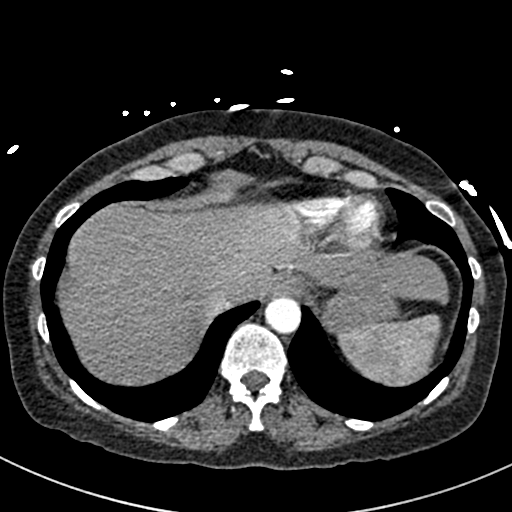
[im 49/279  lung]
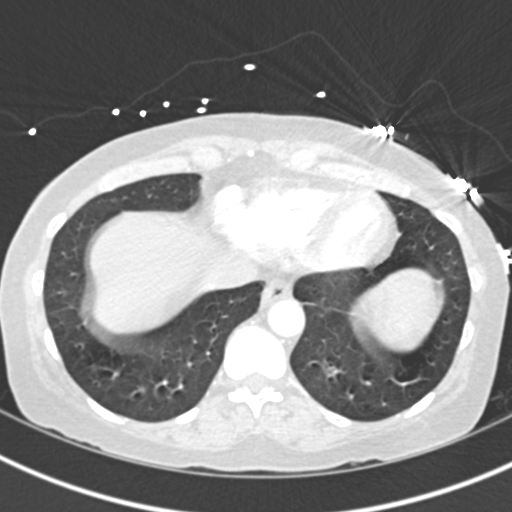
[im 73/279  soft-tissue]
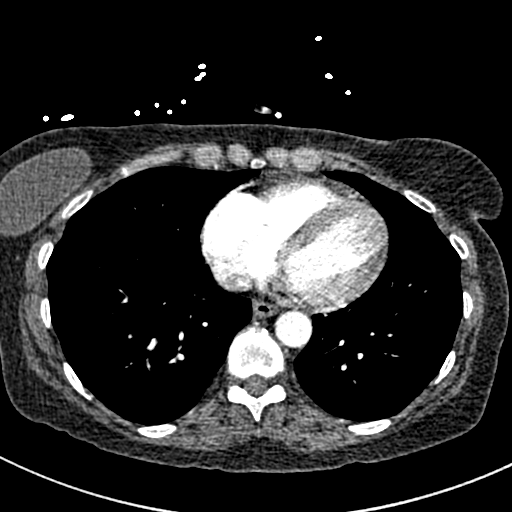
[im 85/279  lung]
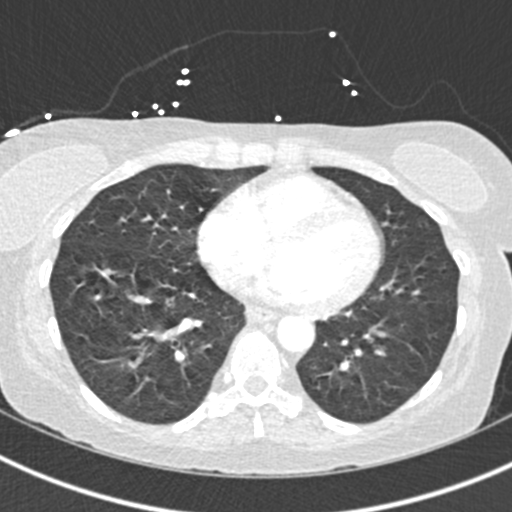
[im 109/279  soft-tissue]
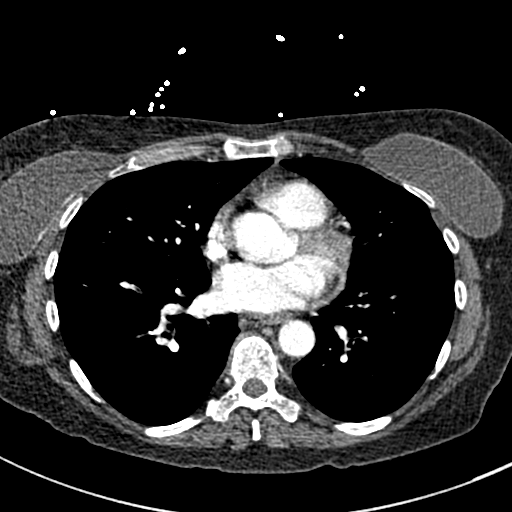
[im 121/279  lung]
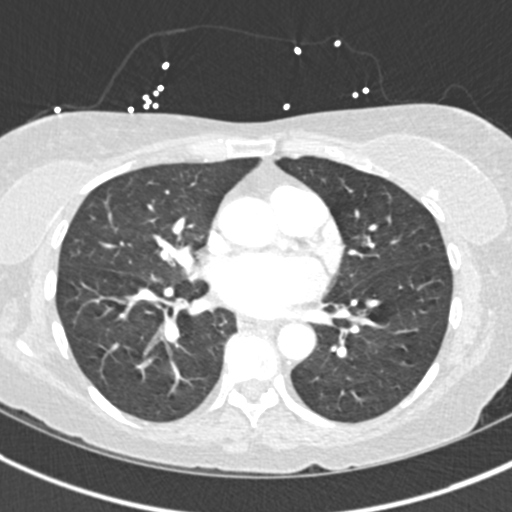
[im 146/279  soft-tissue]
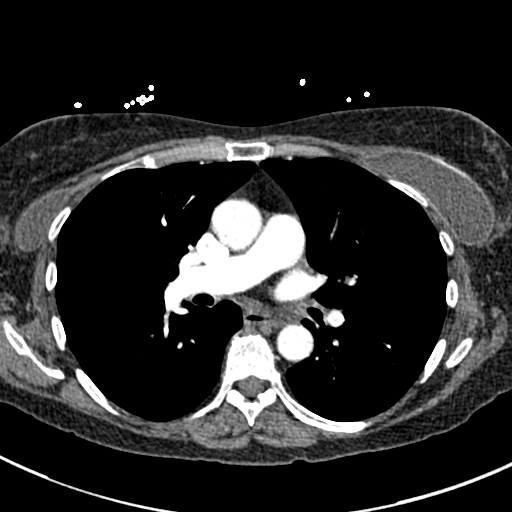
[im 158/279  lung]
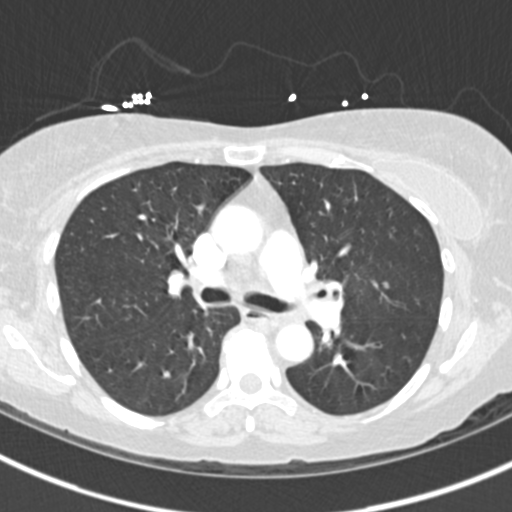
[im 170/279  soft-tissue]
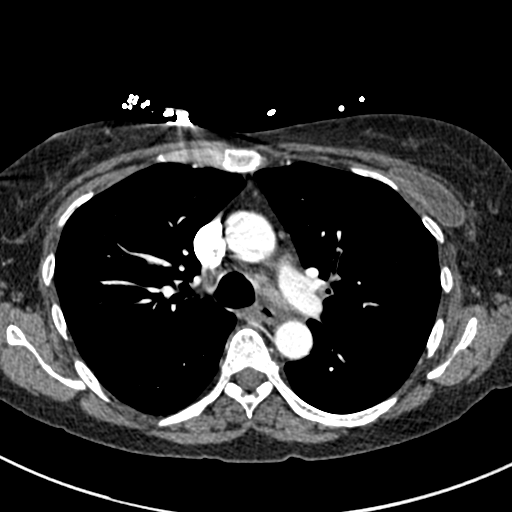
[im 194/279  lung]
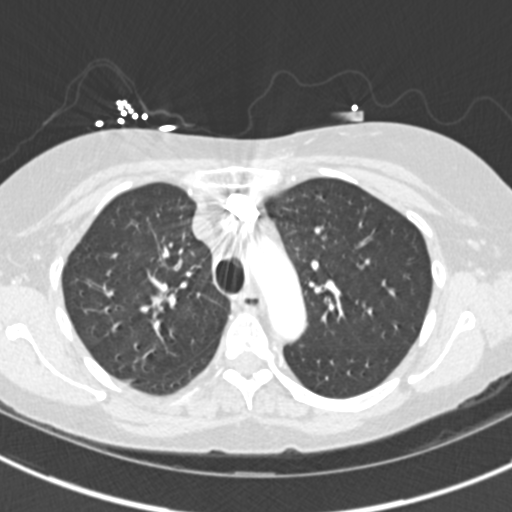
[im 206/279  soft-tissue]
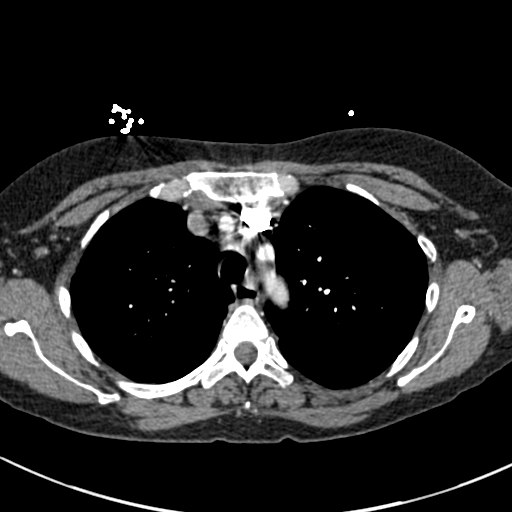
[im 230/279  lung]
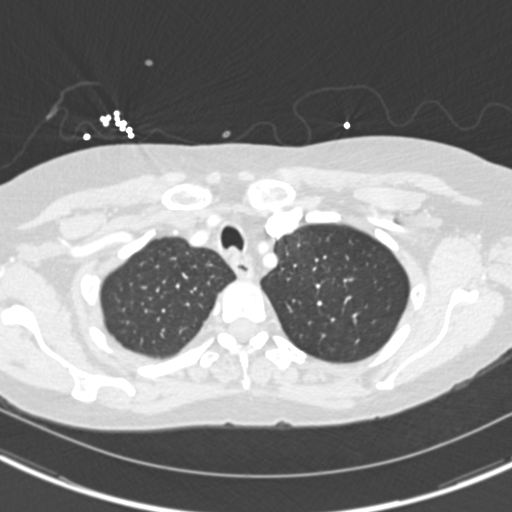
[im 242/279  soft-tissue]
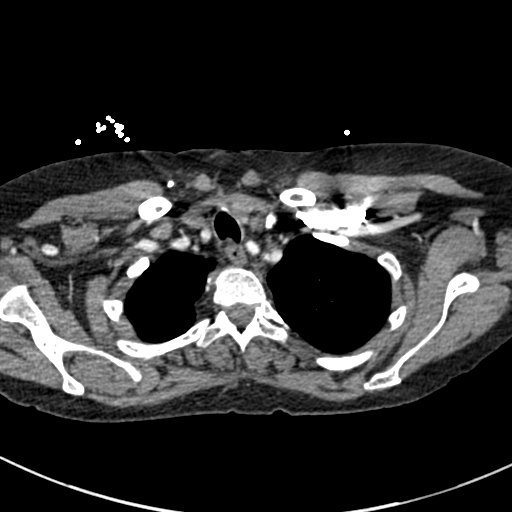
[im 266/279  lung]
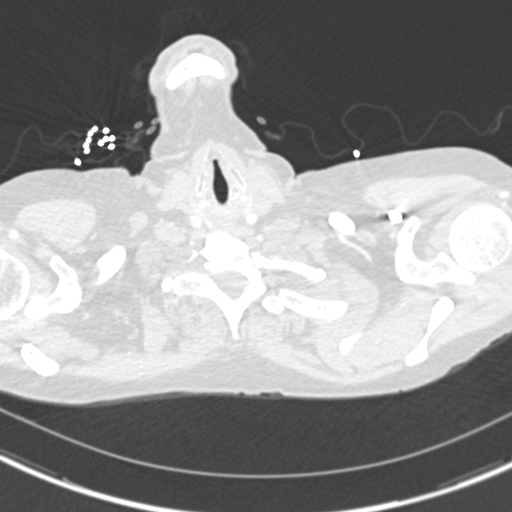

[Series 7: coronal mpr · coronal · 0.57mm/px · 3 of 112 slices shown]
[im 28/112  soft-tissue]
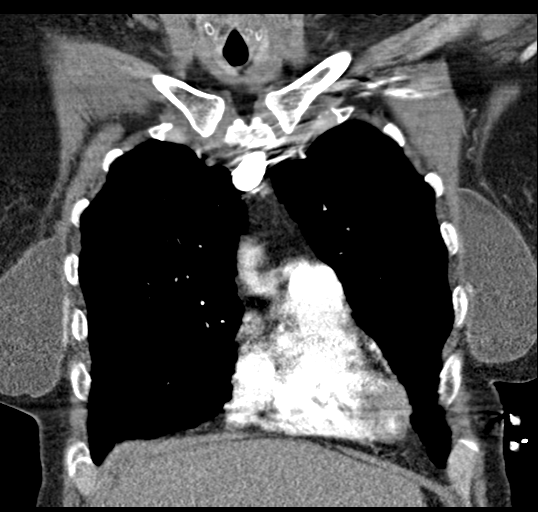
[im 56/112  soft-tissue]
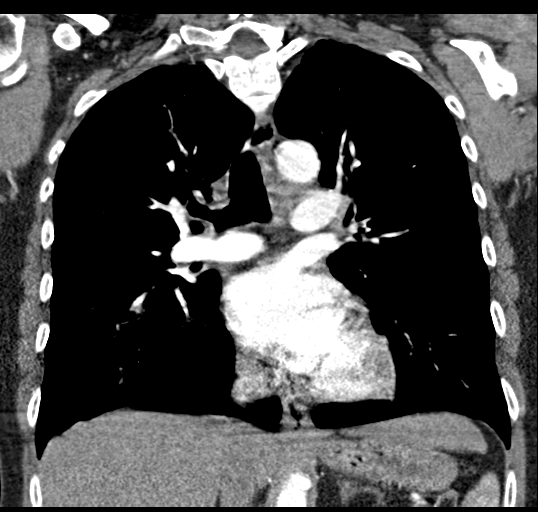
[im 84/112  soft-tissue]
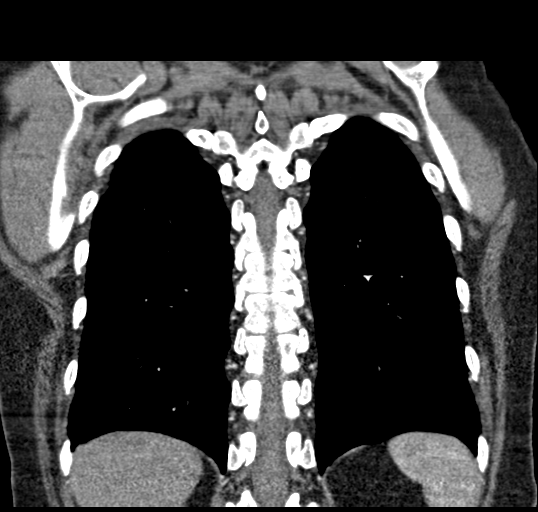

[18 of 46 positions shown; findings below may reference images not displayed]

FINDINGS: Cardiovascular: There is no demonstrable pulmonary embolus. There is
no thoracic aortic aneurysm or dissection. The visualized great
vessels appear unremarkable. There is no pericardial effusion or
pericardial thickening.

Mediastinum/Nodes: Thyroid appears unremarkable. There is no
appreciable thoracic adenopathy. No esophageal lesions are evident.

Lungs/Pleura: There is no appreciable edema or consolidation. There
is a 3 mm nodular opacity abutting the pleura in the anterior
segment right upper lobe seen on axial slice 40 series 6. There is
no pleural effusion or pleural thickening evident.

Upper Abdomen: Visualized upper abdominal structures appear
unremarkable.

Musculoskeletal: There are breast implants bilaterally. There are no
blastic or lytic bone lesions. No chest wall lesions are evident.

Review of the MIP images confirms the above findings.
IMPRESSION: 1. No demonstrable pulmonary embolus. No thoracic aortic aneurysm or
dissection.

2. 3 mm nodular opacity abutting the pleura in the right upper lobe.
No follow-up needed if patient is low-risk. Non-contrast chest CT
can be considered in 12 months if patient is high-risk. This
recommendation follows the consensus statement: Guidelines for
Management of Incidental Pulmonary Nodules Detected on CT Images:
lung edema or consolidation.

3.  No appreciable thoracic adenopathy.

## 2020-04-06 HISTORY — PX: SPINAL FUSION: SHX223

## 2020-04-09 ENCOUNTER — Emergency Department (HOSPITAL_COMMUNITY)
Admission: EM | Admit: 2020-04-09 | Discharge: 2020-04-10 | Disposition: A | Discharge status: Home / Self Care | Payer: BC Managed Care – PPO | Attending: Emergency Medicine | Admitting: Emergency Medicine

## 2020-04-09 ENCOUNTER — Emergency Department (HOSPITAL_COMMUNITY): Payer: BC Managed Care – PPO

## 2020-04-09 ENCOUNTER — Other Ambulatory Visit: Payer: Self-pay

## 2020-04-09 ENCOUNTER — Encounter (HOSPITAL_COMMUNITY): Payer: Self-pay

## 2020-04-09 DIAGNOSIS — I10 Essential (primary) hypertension: Secondary | ICD-10-CM | POA: Diagnosis not present

## 2020-04-09 DIAGNOSIS — Z79899 Other long term (current) drug therapy: Secondary | ICD-10-CM | POA: Diagnosis not present

## 2020-04-09 DIAGNOSIS — Z87891 Personal history of nicotine dependence: Secondary | ICD-10-CM | POA: Insufficient documentation

## 2020-04-09 DIAGNOSIS — F329 Major depressive disorder, single episode, unspecified: Secondary | ICD-10-CM | POA: Insufficient documentation

## 2020-04-09 DIAGNOSIS — Z20822 Contact with and (suspected) exposure to covid-19: Secondary | ICD-10-CM | POA: Insufficient documentation

## 2020-04-09 DIAGNOSIS — R4182 Altered mental status, unspecified: Secondary | ICD-10-CM

## 2020-04-09 DIAGNOSIS — R462 Strange and inexplicable behavior: Secondary | ICD-10-CM | POA: Diagnosis present

## 2020-04-09 LAB — COMPREHENSIVE METABOLIC PANEL
ALT: 23 U/L (ref 0–44)
AST: 21 U/L (ref 15–41)
Albumin: 4.1 g/dL (ref 3.5–5.0)
Alkaline Phosphatase: 55 U/L (ref 38–126)
Anion gap: 10 (ref 5–15)
BUN: 24 mg/dL — ABNORMAL HIGH (ref 6–20)
CO2: 26 mmol/L (ref 22–32)
Calcium: 9 mg/dL (ref 8.9–10.3)
Chloride: 98 mmol/L (ref 98–111)
Creatinine, Ser: 1.3 mg/dL — ABNORMAL HIGH (ref 0.44–1.00)
GFR calc Af Amer: 52 mL/min — ABNORMAL LOW (ref >60)
GFR calc non Af Amer: 45 mL/min — ABNORMAL LOW (ref >60)
Glucose, Bld: 108 mg/dL — ABNORMAL HIGH (ref 70–99)
Potassium: 3.5 mmol/L (ref 3.5–5.1)
Sodium: 134 mmol/L — ABNORMAL LOW (ref 135–145)
Total Bilirubin: 0.6 mg/dL (ref 0.3–1.2)
Total Protein: 6.8 g/dL (ref 6.5–8.1)

## 2020-04-09 LAB — CBC WITH DIFFERENTIAL/PLATELET
Abs Immature Granulocytes: 0.01 10*3/uL (ref 0.00–0.07)
Basophils Absolute: 0 10*3/uL (ref 0.0–0.1)
Basophils Relative: 1 %
Eosinophils Absolute: 0.1 10*3/uL (ref 0.0–0.5)
Eosinophils Relative: 2 %
HCT: 36 % (ref 36.0–46.0)
Hemoglobin: 11.5 g/dL — ABNORMAL LOW (ref 12.0–15.0)
Immature Granulocytes: 0 %
Lymphocytes Relative: 18 %
Lymphs Abs: 0.7 10*3/uL (ref 0.7–4.0)
MCH: 33.5 pg (ref 26.0–34.0)
MCHC: 31.9 g/dL (ref 30.0–36.0)
MCV: 105 fL — ABNORMAL HIGH (ref 80.0–100.0)
Monocytes Absolute: 0.5 10*3/uL (ref 0.1–1.0)
Monocytes Relative: 12 %
Neutro Abs: 2.7 10*3/uL (ref 1.7–7.7)
Neutrophils Relative %: 67 %
Platelets: 298 10*3/uL (ref 150–400)
RBC: 3.43 MIL/uL — ABNORMAL LOW (ref 3.87–5.11)
RDW: 13.1 % (ref 11.5–15.5)
WBC: 4 10*3/uL (ref 4.0–10.5)
nRBC: 0 % (ref 0.0–0.2)

## 2020-04-09 LAB — RESPIRATORY PANEL BY RT PCR (FLU A&B, COVID)
Influenza A by PCR: NEGATIVE
Influenza B by PCR: NEGATIVE
SARS Coronavirus 2 by RT PCR: NEGATIVE

## 2020-04-09 LAB — ACETAMINOPHEN LEVEL: Acetaminophen (Tylenol), Serum: <10 ug/mL — ABNORMAL LOW (ref 10–30)

## 2020-04-09 LAB — RAPID URINE DRUG SCREEN, HOSP PERFORMED
Amphetamines: NOT DETECTED
Barbiturates: NOT DETECTED
Benzodiazepines: POSITIVE — AB
Cocaine: NOT DETECTED
Opiates: NOT DETECTED
Tetrahydrocannabinol: NOT DETECTED

## 2020-04-09 LAB — SALICYLATE LEVEL: Salicylate Lvl: <7 mg/dL — ABNORMAL LOW (ref 7.0–30.0)

## 2020-04-09 LAB — ETHANOL: Alcohol, Ethyl (B): <10 mg/dL (ref <10)

## 2020-04-09 MED ORDER — LORAZEPAM 2 MG/ML IJ SOLN
2.0000 mg | Freq: Once | INTRAMUSCULAR | Status: AC
  Administered 2020-04-09: 2 mg via INTRAVENOUS

## 2020-04-09 MED ORDER — LORAZEPAM 1 MG PO TABS
0.0000 mg | ORAL_TABLET | Freq: Four times a day (QID) | ORAL | Status: DC
  Administered 2020-04-09: 2 mg via ORAL
  Filled 2020-04-09: qty 2

## 2020-04-09 MED ORDER — LORAZEPAM 2 MG/ML IJ SOLN
INTRAMUSCULAR | Status: AC
  Filled 2020-04-09: qty 1

## 2020-04-09 MED ORDER — LORAZEPAM 1 MG PO TABS: 0.0000 mg | ORAL_TABLET | Freq: Two times a day (BID) | ORAL | Status: DC

## 2020-04-09 MED ORDER — THIAMINE HCL 100 MG PO TABS
100.0000 mg | ORAL_TABLET | Freq: Every day | ORAL | Status: DC
  Administered 2020-04-09 – 2020-04-10 (×2): 100 mg via ORAL
  Filled 2020-04-09 (×2): qty 1

## 2020-04-09 MED ORDER — STERILE WATER FOR INJECTION IJ SOLN
INTRAMUSCULAR | Status: AC
  Filled 2020-04-09: qty 10

## 2020-04-09 MED ORDER — ZIPRASIDONE MESYLATE 20 MG IM SOLR
INTRAMUSCULAR | Status: AC
  Filled 2020-04-09: qty 20

## 2020-04-09 MED ORDER — LORAZEPAM 2 MG/ML IJ SOLN: 0.0000 mg | Freq: Four times a day (QID) | INTRAMUSCULAR | Status: DC

## 2020-04-09 MED ORDER — LORAZEPAM 2 MG/ML IJ SOLN: 0.0000 mg | Freq: Two times a day (BID) | INTRAMUSCULAR | Status: DC

## 2020-04-09 MED ORDER — THIAMINE HCL 100 MG/ML IJ SOLN: 100.0000 mg | Freq: Every day | INTRAMUSCULAR | Status: DC

## 2020-04-09 MED ORDER — ZIPRASIDONE MESYLATE 20 MG IM SOLR
10.0000 mg | Freq: Once | INTRAMUSCULAR | Status: AC
  Administered 2020-04-09: 10 mg via INTRAMUSCULAR

## 2020-04-09 NOTE — ED Triage Notes (Signed)
Patient coming from home with combative behavior. Ems give 2.5 mg versed IM. Patient observed to be combative upon arrival. Dr. Wilson Singer was at the bedside.

## 2020-04-09 NOTE — ED Provider Notes (Signed)
Pt assessed by TTS.  Overnight observation recommended.  Discussed with pt.  She is comfortable staying overnight for observation.   Dorie Rank, MD 04/09/20 [phone]

## 2020-04-09 NOTE — ED Provider Notes (Signed)
[facility] DEPT Provider Note   CSN: [id number] Arrival date & time: 04/09/20  1236     History No chief complaint on file.   [name] is a 59 y.o. female.  HPI   23yF brought in by EMS for bizarre behavior. Pt apparently called an alarm company because she was not feeling well. EMS subsequently became involved. She feels like she is having a stroke because she feels "weird." She emotionally labile and it's hard to get a coherent history from her otherwise. She admits to drinking vodka last night. Denies today but took oxycodone this morning. She says she began feeling "weird" around 10:30 am today. She keeps telling me that she needs to get off the stretcher so she can go over to the [facility]. She needs constant redirection and lightly restrained at times. EMS reports her behavior has been like this since they arrived on scene. They had to give her 2.5mg  of versed because she kept trying to get off their stretcher as well. There was no additional person on scene to give them additional information.  Past Medical History:  Diagnosis Date  . Adenomatous polyps   . Anemia   . Blood transfusion without reported diagnosis 01/1999   following hysterectomy  . Bulging discs   . Endometriosis   . Hx of respiratory failure   . Hyperlipidemia   . Hypertension   . PVC (premature ventricular contraction)   . Stroke ([place])   . Urinary incontinence    Patient Active Problem List   Diagnosis Date Noted  . Acute respiratory failure with hypoxia ([place]) 01/16/2019  . Acute respiratory failure with hypoxemia ([place]) 01/15/2019  . Hyperglycemia 01/15/2019  . Pulmonary nodule 01/15/2019  . 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 Adjustment disorder with depressed mood 07/01/2017  . Lumbar stenosis with neurogenic claudication 02/09/2017  . Lumbar stenosis 02/09/2017  . Mixed incontinence 08/05/2014  . HTN (hypertension) 02/07/2012  . Depression 02/07/2012  .  Dyslipidemia 02/07/2012  . Menopausal and perimenopausal disorder 02/07/2012   Past Surgical History:  Procedure Laterality Date  . 5 back surgeries    . ABDOMINAL HYSTERECTOMY  2001   TAH/RSO  . AUGMENTATION MAMMAPLASTY Bilateral 2013   saline. pre pectoral  . BLADDER SUSPENSION  2003   LSO and rectocele repair at [facility]  . BREAST SURGERY     breast augmentation--saline implants  . HERNIA REPAIR    . LUMBAR FUSION  2010   Dr. Carloyn Manner  . REDUCTION MAMMAPLASTY Bilateral   . SPINAL FUSION  2018   Dr. Ellene Route  . SPINE SURGERY      OB History    Gravida  5   Para  3   Term  3   Preterm      AB  2   Living  3     SAB  2   TAB      Ectopic      Multiple      Live Births             Family History  Problem Relation Age of Onset  . Hypertension Mother   . Stroke Mother   . Hypertension Father   . Cancer Father   . Hyperlipidemia Sister   . Hypertension Sister   . Hyperlipidemia Brother   . Hypertension Brother   . Hypertension Brother   . Hypertension Brother   . Cancer Maternal Uncle 72       colon ca  .  Stroke Maternal Uncle   . Breast cancer Maternal Grandmother   . Cancer Maternal Grandmother 58       colon ca  . Stroke Maternal Aunt   . Stroke Paternal Grandfather    Social History   Tobacco Use  . Smoking status: Former Smoker    Types: Cigarettes  . Smokeless tobacco: Never Used  Substance Use Topics  . Alcohol use: Yes    Alcohol/week: 6.0 - 8.0 standard drinks    Types: 6 - 8 Standard drinks or equivalent per week    Comment: occasionally   . Drug use: Yes    Types: Marijuana    Comment: occassionally    Home Medications Prior to Admission medications   Medication Sig Start Date End Date Taking? Authorizing Provider  albuterol (PROVENTIL) (2.5 MG/3ML) 0.083% nebulizer solution Take 3 mLs (2.5 mg total) by nebulization 2 (two) times daily as needed for wheezing or shortness of breath. 01/18/19   Kayleen Memos, DO  ALPRAZolam Duanne Moron)  0.5 MG tablet Take 0.5 mg by mouth daily as needed for anxiety. for anxiety 06/11/18   [provider]  buPROPion HCl ER, XL, 450 MG TB24 Take 450 mg by mouth daily.  01/03/20   [provider]  ezetimibe (ZETIA) 10 MG tablet Take 1 tablet (10 mg total) by mouth daily. 08/28/18   Frann Rider, NP  folic acid (FOLVITE) 1 MG tablet Take 1 tablet (1 mg total) by mouth daily. Patient not taking: Reported on 01/05/2020 01/18/19   Kayleen Memos, DO  losartan (COZAAR) 100 MG tablet Take 100 mg by mouth daily.     [provider]  metoprolol succinate (TOPROL-XL) 25 MG 24 hr tablet Take 25 mg by mouth daily.    [provider]  rosuvastatin (CRESTOR) 20 MG tablet Take 1 tablet (20 mg total) by mouth daily at 6 PM. 06/20/18   Rinehuls, Early Chars, PA-C  triamterene-hydrochlorothiazide (MAXZIDE-25) 37.5-25 MG tablet Take 1 tablet by mouth daily.    [provider]  zolpidem (AMBIEN) 10 MG tablet Take 10 mg by mouth at bedtime as needed for sleep.    [provider]    Allergies    Ancef [cefazolin], Ceftin [cefuroxime], and Penicillins  Review of Systems   Review of Systems Level 5 caveat because pt is uncooperative.  Physical Exam Updated Vital Signs BP (!) 172/92 (BP Location: Right Arm)   Pulse 85   Temp 97.7 F (36.5 C) (Oral)   Resp (!) 21   SpO2 98%   Physical Exam Vitals and nursing note reviewed.  Constitutional:      Appearance: She is well-developed.     Comments: Emotionally upset. Crying. Frequently trying to get up from the stretcher and only briefly redirectable.  HENT:     Head: Normocephalic and atraumatic.  Eyes:     General:        Right eye: No discharge.        Left eye: No discharge.     Comments: conjunctiva injected but crying. Pupils 39mm, symmetric and reactive  Cardiovascular:     Rate and Rhythm: Normal rate and regular rhythm.     Heart sounds: Normal heart sounds. No murmur. No friction rub. No gallop.     Pulmonary:     Effort: Pulmonary effort is normal. No respiratory distress.     Breath sounds: Normal breath sounds.  Abdominal:     General: There is no distension.     Palpations: Abdomen  is soft.     Tenderness: There is no abdominal tenderness.  Musculoskeletal:        General: No tenderness.     Cervical back: Neck supple.  Skin:    General: Skin is warm and dry.  Neurological:     Mental Status: She is alert.     Comments: Selectively answering questions. Able to provide me her name and recognizes that she is in the hospital. Face symmetric. Moving all extremities with good strength.      ED Results / Procedures / Treatments   Labs (all labs ordered are listed, but only abnormal results are displayed) Labs Reviewed  CBC WITH DIFFERENTIAL/PLATELET - Abnormal; Notable for the following components:      Result Value   RBC 3.43 (*)    Hemoglobin 11.5 (*)    MCV 105.0 (*)    All other components within normal limits  COMPREHENSIVE METABOLIC PANEL - Abnormal; Notable for the following components:   Sodium 134 (*)    Glucose, Bld 108 (*)    BUN 24 (*)    Creatinine, Ser 1.30 (*)    GFR calc non Af Amer 45 (*)    GFR calc Af Amer 52 (*)    All other components within normal limits  RAPID URINE DRUG SCREEN, HOSP PERFORMED - Abnormal; Notable for the following components:   Benzodiazepines POSITIVE (*)    All other components within normal limits  SALICYLATE LEVEL - Abnormal; Notable for the following components:   Salicylate Lvl Q000111Q (*)    All other components within normal limits  ACETAMINOPHEN LEVEL - Abnormal; Notable for the following components:   Acetaminophen (Tylenol), Serum <10 (*)    All other components within normal limits  RESPIRATORY PANEL BY RT PCR (FLU A&B, COVID)  ETHANOL  I-STAT BETA HCG BLOOD, ED ([facility], [facility], [facility] ONLY)    EKG EKG Interpretation  Date/Time:  Saturday April 09 2020 15:36:08 EDT Ventricular Rate:  80 PR Interval:    QRS  Duration: 93 QT Interval:  409 QTC Calculation: 472 R Axis:   72 Text Interpretation: Sinus rhythm Anterior infarct, old No significant change since last tracing Confirmed by Dorie Rank [phone]) on 04/09/2020 3:40:51 PM   Radiology CT Head Wo Contrast  Result Date: 04/09/2020 CLINICAL DATA:  59 year old female with altered mental status. EXAM: CT HEAD WITHOUT CONTRAST TECHNIQUE: Contiguous axial images were obtained from the base of the skull through the vertex without intravenous contrast. COMPARISON:  Head CT dated 06/19/2018. FINDINGS: Brain: Mild cortical atrophy. The ventricles are appropriate size for patient's age. The gray-white matter discrimination is preserved. There is no acute intracranial hemorrhage. No mass effect or midline shift. No extra-axial fluid collection. Vascular: No hyperdense vessel or unexpected calcification. Skull: Normal. Negative for fracture or focal lesion. Sinuses/Orbits: No acute finding. Other: None IMPRESSION: No acute intracranial pathology. Electronically Signed   By: Anner Crete M.D.   On: 04/09/2020 15:23    Procedures Procedures (including critical care time)  Medications Ordered in ED Medications  ziprasidone (GEODON) 20 MG injection (has no administration in time range)  LORazepam (ATIVAN) 2 MG/ML injection (has no administration in time range)  sterile water (preservative free) injection (has no administration in time range)  LORazepam (ATIVAN) injection 2 mg (2 mg Intravenous Given 04/09/20 1251)  ziprasidone (GEODON) injection 10 mg (10 mg Intramuscular Given 04/09/20 1251)    ED Course  I have reviewed the triage vital signs and the nursing notes.  Pertinent labs &  imaging results that were available during my care of the patient were reviewed by me and considered in my medical decision making (see chart for details).    MDM Rules/Calculators/A&P                      69yF with odd behavior. Crying at times. Needs constant redirection  and light physical restraint at times. Given chemical restraints so we can safely assess her. My initial impression is acute alcohol intoxication and possibly medications as well. Nothing about her presentation makes me think she is having a stroke other than her continually saying that she is. She is emotionally upset but doesn't appear to be medically distressed. Initially hypertensive but vitals are otherwise reassuring.   W/u unremarkable as to exact etiology. Ethanol normal. UDS negative aside from benzos but got versed from EMS and ativan in the ER. Pt now sleeping in bed. Awakens but unable to participate in exam after sedation.  Presentation still seems more consistent with psychiatric process, but will CT head.   Ct w/o acute findings. Pt calmer now. Still says she doesn't feel right. "This doesn't feel like it's real." She says she felt like her speech was slurred earlier and she was off balance. She feels like her speech is now normal. It sounds essentially the same to me currently as it did when she arrived. She has a pretty bad intention tremor to the point that I had to type in the password in her phone to unlock it for her. I spoke to her son Lovena Le [phone]. He is not sure of the specific details of her medical/psychiatric history but says she has shown somewhat similar behavior previously.   Final Clinical Impression(s) / ED Diagnoses Final diagnoses:  Altered mental status, unspecified altered mental status type    Rx / DC Orders ED Discharge Orders    None       Virgel Manifold, MD 04/10/20 1106

## 2020-04-09 NOTE — BH Assessment (Addendum)
Assessment Note  [name] is an 59 y.o. female that presents this date voluntary with altered mental state. Patient denies any S/I, H/I or AVH. Patient's recent recall is noted to be impaired at the time of assessment. Patient renders limited history on events that transpired prior to her arrival. Patient states that she resides alone and had a individual over this date at her residence that was "doing work for her" and assisted her in contacting EMS after he noticed her impairment. Patient reports that she may have accidentally over taken her Losartan which caused her impairment. Patient states she is currently prescribed multiple medications from [facility] MD her PCP. Patient states she was diagnosed with depression and anxiety "years ago" and is currently prescribed Bupropion, Ambien, and Xanax for symptom management. Patient reports she takes those medications as directed. Patient denies any previous attempts or gestures at self harm. Patient reports current SA use to include using alcohol in various amounts two to three times a week with last use last night when she reports she had a "mixed drink." Patient also reports using THC (vaping) three to four times a week with last use last night. Patient also states she is getting ready to have back surgery this coming Monday and has been prescribed Percocet (dosage unknown) to assist with pain management issues. Patient states she takes that medication as needed with last use "she believes" to be prior to arrival. Patient denies any prior history of hospitalizations associated with mental health. Patient denies any history of self injury. Patient is observed to be drowsy at the time of assessment and renders limited history. Notes this date by EDP on arrival state, "Patient is brought in by EMS for bizarre behavior. Pt apparently called an alarm company because she was not feeling well. EMS subsequently became involved. She feels like she is having a stroke  because she feels "weird." She emotionally labile and it's hard to get a coherent history from her otherwise. She admits to drinking vodka last night. Denies today but took oxycodone this morning. She says she began feeling "weird" around 10:30 am today. She keeps telling me that she needs to get off the stretcher so she can go over to the [facility]. She needs constant redirection and lightly restrained at times. EMS reports her behavior has been like this since they arrived on scene. They had to give her 2.5mg  of versed because she kept trying to get off their stretcher as well. There was no additional person on scene to give them additional information".   Patient is noted to be tearful at times during assessment and asks "I am crazy?" Patient speaks in soft slow voice and is having problems recalling the sequence of events that transpired prior to arrival. Patient's UDS is positive for Benzodiazapines and BAL was less than 10. Patient's memory is recent impaired and thoughts are somewhat disorganized. Patient is oriented x 3 with prompting. Patient does not appear to be responding to internal stimuli. Case was staffed with Bobby Rumpf NP who recommended patient be observed and monitored.       Diagnosis: Altered mental state  Past Medical History:  Past Medical History:  Diagnosis Date  . Adenomatous polyps   . Anemia   . Blood transfusion without reported diagnosis 01/1999   following hysterectomy  . Bulging discs   . Endometriosis   . Hx of respiratory failure   . Hyperlipidemia   . Hypertension   . PVC (premature ventricular contraction)   .  Stroke ([place])   . Urinary incontinence     Past Surgical History:  Procedure Laterality Date  . 5 back surgeries    . ABDOMINAL HYSTERECTOMY  2001   TAH/RSO  . AUGMENTATION MAMMAPLASTY Bilateral 2013   saline. pre pectoral  . BLADDER SUSPENSION  2003   LSO and rect[facility]cele repair at [facility]  . BREAST SURGERY     breast augmentation--saline implants  .  HERNIA REPAIR    . LUMBAR FUSION  2010   Dr. Carloyn Manner  . REDUCTION MAMMAPLASTY Bilateral   . SPINAL FUSION  2018   Dr. Ellene Route  . SPINE SURGERY      Family History:  Family History  Problem Relation Age of Onset  . Hypertension Mother   . Stroke Mother   . Hypertension Father   . Cancer Father   . Hyperlipidemia Sister   . Hypertension Sister   . Hyperlipidemia Brother   . Hypertension Brother   . Hypertension Brother   . Hypertension Brother   . Cancer Maternal Uncle 72       colon ca  . Stroke Maternal Uncle   . Breast cancer Maternal Grandmother   . Cancer Maternal Grandmother 39       colon ca  . Stroke Maternal Aunt   . Stroke Paternal Grandfather     Social History:  reports that she has quit smoking. Her smoking use included cigarettes. She has never used smokeless tobacco. She reports current alcohol use of about 6.0 - 8.0 standard drinks of alcohol per week. She reports current drug use. Drug: Marijuana.  Additional Social History:  Alcohol / Drug Use Pain Medications: See MAR Prescriptions: See MAR Over the Counter: See MAR History of alcohol / drug use?: Yes Longest period of sobriety (when/how long): Unknown Negative Consequences of Use: (Denies) Withdrawal Symptoms: (Denies) Substance #1 Name of Substance 1: Alcohol 1 - Age of First Use: UTA 1 - Amount (size/oz): Varies 1 - Frequency: Varies 1 - Duration: Ongoing 1 - Last Use / Amount: Pt states last night "a mixed drink" Substance #2 Name of Substance 2: Cannabis 2 - Age of First Use: UTA 2 - Amount (size/oz): Varies 2 - Frequency: Varies 2 - Duration: Ongoing 2 - Last Use / Amount: Pt states last night she "vaped some THC"  CIWA: CIWA-Ar BP: 134/83 Pulse Rate: 73 COWS:    Allergies:  Allergies  Allergen Reactions  . Ancef [Cefazolin] Hives    Cause rash shortness of breath and vomiting   . Ceftin [Cefuroxime] Nausea And Vomiting  . Penicillins Hives    Has patient had a PCN reaction  causing immediate rash, facial/tongue/throat swelling, SOB or lightheadedness with hypotension: Yes Has patient had a PCN reaction causing severe rash involving mucus membranes or skin necrosis:Yes Has patient had a PCN reaction that required hospitalization:No Has patient had a PCN reaction occurring within the last 10 years: No If all of the above answers are "NO", then may proceed with Cephalosporin use.     Home Medications: (Not in a hospital admission)   OB/GYN Status:  No LMP recorded. Patient has had a hysterectomy.  General Assessment Data [facility] of Assessment: [facility] TTS Assessment: In system Is this a Tele or Face-to-Face Assessment?: Face-to-Face Is this an Initial Assessment or a Re-assessment for this encounter?: Initial Assessment Patient Accompanied by:: N/A Language Other than English: No Living Arrangements: Other (Comment)(Alone) What gender do you identify as?: Female Marital status: Divorced [place] name: [place] Pregnancy Status: No  Living Arrangements: Alone Can pt return to current living arrangement?: Yes Admission Status: Voluntary Is patient capable of signing voluntary admission?: Yes Referral Source: Self/Family/Friend Insurance type: [place] Living Arrangements: Alone Legal Guardian: (NA) Name of Psychiatrist: None Name of Therapist: None  Education Status Is patient currently in school?: No Is the patient employed, unemployed or receiving disability?: Employed  Risk to self with the past 6 months Suicidal Ideation: No Has patient been a risk to self within the past 6 months prior to admission? : No Suicidal Intent: No Has patient had any suicidal intent within the past 6 months prior to admission? : No Is patient at risk for suicide?: No Suicidal Plan?: No Has patient had any suicidal plan within the past 6 months prior to admission? : No Access to Means: No What has been your use of drugs/alcohol within the last 12  months?: Current use Previous Attempts/Gestures: No How many times?: 0 Other Self Harm Risks: (NA) Triggers for Past Attempts: (NA) Intentional Self Injurious Behavior: None Family Suicide History: No Recent stressful life event(s): (Pt denies) Persecutory voices/beliefs?: No Depression: Yes Depression Symptoms: (Pt denies symptoms although has diag) Substance abuse history and/or treatment for substance abuse?: No Suicide prevention information given to non-admitted patients: Not applicable  Risk to Others within the past 6 months Homicidal Ideation: No Does patient have any lifetime risk of violence toward others beyond the six months prior to admission? : No Thoughts of Harm to Others: No Current Homicidal Intent: No Current Homicidal Plan: No Access to Homicidal Means: No Identified Victim: NA History of harm to others?: No Assessment of Violence: On admission Violent Behavior Description: Combative with staff Does patient have access to weapons?: No Criminal Charges Pending?: No Does patient have a court date: No Is patient on probation?: No  Psychosis Hallucinations: None noted Delusions: None noted  Mental Status Report Appearance/Hygiene: In hospital gown Eye Contact: Good Motor Activity: Freedom of movement Speech: Soft, Slow Level of Consciousness: Drowsy Mood: Pleasant Affect: Appropriate to circumstance Anxiety Level: Minimal Thought Processes: Coherent Judgement: Partial Orientation: Person, Place, Time Obsessive Compulsive Thoughts/Behaviors: None  Cognitive Functioning Concentration: Decreased Memory: Recent Impaired Is patient IDD: No Insight: Fair Impulse Control: Fair Appetite: Good Have you had any weight changes? : No Change Sleep: No Change Total Hours of Sleep: 7 Vegetative Symptoms: None  ADLScreening [facility] Assessment Services) Patient's cognitive ability adequate to safely complete daily activities?: Yes Patient able to express need  for assistance with ADLs?: Yes Independently performs ADLs?: Yes (appropriate for developmental age)  Prior Inpatient Therapy Prior Inpatient Therapy: No  Prior Outpatient Therapy Prior Outpatient Therapy: Yes Prior Therapy Dates: Ongoing Prior Therapy Facilty/Provider(s): [facility] Reason for Treatment: Med mang Does patient have an ACCT team?: No Does patient have Intensive In-House Services?  : No Does patient have [facility] services? : No Does patient have P4CC services?: No  ADL Screening (condition at time of admission) Patient's cognitive ability adequate to safely complete daily activities?: Yes Is the patient deaf or have difficulty hearing?: No Does the patient have difficulty seeing, even when wearing glasses/contacts?: No Does the patient have difficulty concentrating, remembering, or making decisions?: No Patient able to express need for assistance with ADLs?: Yes Does the patient have difficulty dressing or bathing?: No Independently performs ADLs?: Yes (appropriate for developmental age) Does the patient have difficulty walking or climbing stairs?: No Weakness of Legs: None Weakness of Arms/Hands: None  Home Assistive Devices/Equipment Home  Assistive Devices/Equipment: None  Therapy Consults (therapy consults require a physician order) PT Evaluation Needed: No OT Evalulation Needed: No SLP Evaluation Needed: No Abuse/Neglect Assessment (Assessment to be complete while patient is alone) Abuse/Neglect Assessment Can Be Completed: Yes Physical Abuse: Denies Verbal Abuse: Denies Sexual Abuse: Denies Exploitation of patient/patient's resources: Denies Self-Neglect: Denies Values / Beliefs Cultural Requests During Hospitalization: None Spiritual Requests During Hospitalization: None Consults Spiritual Care Consult Needed: No Transition of Care Team Consult Needed: No Advance Directives (For Healthcare) Does Patient Have a Medical Advance Directive?: No Would  patient like information on creating a medical advance directive?: No - Patient declined          Disposition: Case was staffed with Bobby Rumpf NP who recommended patient be observed and monitored.       Disposition Initial Assessment Completed for this Encounter: Yes  On Site Evaluation by:   Reviewed with Physician:    Mamie Nick 04/09/2020 5:45 PM

## 2020-04-09 NOTE — BH Assessment (Signed)
[facility] Assessment Progress Note   Case was staffed with Bobby Rumpf NP who recommended patient be observed and monitored.

## 2020-04-10 DIAGNOSIS — R4182 Altered mental status, unspecified: Secondary | ICD-10-CM

## 2020-04-10 DIAGNOSIS — F19921 Other psychoactive substance use, unspecified with intoxication with delirium: Secondary | ICD-10-CM | POA: Insufficient documentation

## 2020-04-10 HISTORY — DX: Altered mental status, unspecified: R41.82

## 2020-04-10 NOTE — [facility] Suicide Risk Assessment (Cosign Needed)
Suicide Risk Assessment  Discharge Assessment   [facility] Discharge Suicide Risk Assessment   Principal Problem: Altered mental status, unspecified Discharge Diagnoses: Principal Problem:   Altered mental status, unspecified   [name], 59 y.o., female patient presented to [facility] ED via ambulance for evaluation of unintentional overdose of prescribed losartan.  Patient seen via telepsych by this provider; chart reviewed and consulted with Dr. Dwyane Dee on 04/10/20.  On evaluation [name] reports  Per chart review the patient was evaluated for similar presentation 3 months prior for accidentally taking double Toprol, Wellbutrin, Maxide and Losartan.  She presented with headache,nausea and visual disturbances and feeling fuzzy.  After symptomatic improved, she was discharged home.  Today she is able to recall some of the events leading up to her current hospitalization but remains unclear of provocative factors.  She raises the possibility of taking additional losartan but also reports a progressive headache and questions the possibility of "TIA" despite unremarkable CT for stroke.  She voices the concern "you guys think I am an alcoholic or drug abuser."  She reports an isolated incident that occurred 5 years prior secondary to drinking alcohol that caused some family discord.  Since then, she relates she has built a therapeutic relationship with her 3 children and is concerned her ED visit will compromise their trust in her. She also voices concerns regarding her ex-husband who is a Psychologist, sport and exercise and works for [employer] her medical records.  She vehemently denies alcohol or polysubstance use and do not want them to think she is here for substance related concerns.  Explained to patient, her records are confidential and information is not released without her permission. Otherwise, she reports symptomatic improvement today, no longer feels "weird" and is back to her baseline.  She relates she is  scheduled for spinal fusion tomorrow but will communicate this visit with surgeons prior to surgery.    During evaluation [name] is seated on the hospital bed. She is alert/oriented x 4; calm/cooperative; and mood congruent with affect.  Patient is speaking in a clear tone at moderate volume, and normal pace; with good eye contact. Her thought process is coherent and relevant; There is no indication that she is currently responding to internal/external stimuli or experiencing delusional thought content.  Patient denies suicidal/self-harm/homicidal ideation, psychosis, and paranoia.  Patient has remained calm throughout assessment and has answered questions appropriately.     Total Time spent with patient: 30 minutes  Musculoskeletal: Strength & Muscle Tone: within normal limits Gait & Station: normal Patient leans: N/A  Psychiatric Specialty Exam:   Blood pressure (!) 126/96, pulse 70, temperature 98.2 F (36.8 C), temperature source Oral, resp. rate 16, SpO2 100 %.There is no height or weight on file to calculate BMI.  General Appearance: Casual, Neat and Well Groomed  Eye Contact::  Good  Speech:  Clear and Coherent and Normal Rate  Volume:  Normal  Mood:  Euthymic  Affect:  Appropriate and Congruent  Thought Process:  Coherent and Descriptions of Associations: Intact  Orientation:  Full (Time, Place, and Person)  Thought Content:  Logical  Suicidal Thoughts:  No  Homicidal Thoughts:  No  Memory:  Immediate;   Good Recent;   Good Remote;   Good  Judgement:  Good  Insight:  Fair  Psychomotor Activity:  Normal, rt hand tremor   Concentration:  Good  Recall:  Good  Fund of Knowledge:Good  Language: Good  Akathisia:  Negative  Handed:  Right  AIMS (if  indicated):     Assets:  Communication Skills Desire for Improvement Financial Resources/Insurance Housing Social Support Vocational/Educational  Sleep:     Cognition: WNL  ADL's:  Intact   Mental Status Per  Nursing Assessment::   On Admission:     Demographic Factors:  NA  Loss Factors: NA  Historical Factors: NA  Risk Reduction Factors:   Employed, Living with another person, especially a relative, Positive social support and Positive therapeutic relationship  Continued Clinical Symptoms:  Chronic Pain Medical Diagnoses and Treatments/Surgeries  Cognitive Features That Contribute To Risk:  None    Suicide Risk:  Minimal: No identifiable suicidal ideation.  Patients presenting with no risk factors but with morbid ruminations; may be classified as minimal risk based on the severity of the depressive symptoms   Plan Of Care/Follow-up recommendations: Patient is psych cleared.  Other:  Patient declines Peer Support   She will talk with the EDP regarding her headaches and follow-up care for headaches.   I Spoke with PA Franchot Heidelberg at [facility]; informed of above recommendation and disposition   Mallie Darting, NP 04/10/2020, 12:39 PM

## 2020-04-10 NOTE — ED Notes (Signed)
Meal tray provided.

## 2020-04-10 NOTE — ED Notes (Signed)
Patient assessed by psych and patient to be discharged. Pt was told this as well as me by [facility]. Patient left without discharge paperwork.

## 2020-11-25 DIAGNOSIS — R300 Dysuria: Secondary | ICD-10-CM | POA: Diagnosis not present

## 2020-12-01 DIAGNOSIS — M545 Low back pain, unspecified: Secondary | ICD-10-CM | POA: Diagnosis not present

## 2020-12-01 DIAGNOSIS — R531 Weakness: Secondary | ICD-10-CM | POA: Diagnosis not present

## 2020-12-01 DIAGNOSIS — R2689 Other abnormalities of gait and mobility: Secondary | ICD-10-CM | POA: Diagnosis not present

## 2020-12-02 DIAGNOSIS — M5416 Radiculopathy, lumbar region: Secondary | ICD-10-CM | POA: Diagnosis not present

## 2020-12-02 DIAGNOSIS — I10 Essential (primary) hypertension: Secondary | ICD-10-CM | POA: Diagnosis not present

## 2020-12-21 DIAGNOSIS — Z20822 Contact with and (suspected) exposure to covid-19: Secondary | ICD-10-CM | POA: Diagnosis not present

## 2020-12-21 DIAGNOSIS — Z03818 Encounter for observation for suspected exposure to other biological agents ruled out: Secondary | ICD-10-CM | POA: Diagnosis not present

## 2020-12-21 DIAGNOSIS — Z1152 Encounter for screening for COVID-19: Secondary | ICD-10-CM | POA: Diagnosis not present

## 2020-12-21 DIAGNOSIS — Z1159 Encounter for screening for other viral diseases: Secondary | ICD-10-CM | POA: Diagnosis not present

## 2021-01-03 DIAGNOSIS — R2689 Other abnormalities of gait and mobility: Secondary | ICD-10-CM | POA: Diagnosis not present

## 2021-01-03 DIAGNOSIS — E782 Mixed hyperlipidemia: Secondary | ICD-10-CM | POA: Diagnosis not present

## 2021-01-03 DIAGNOSIS — M545 Low back pain, unspecified: Secondary | ICD-10-CM | POA: Diagnosis not present

## 2021-01-03 DIAGNOSIS — R531 Weakness: Secondary | ICD-10-CM | POA: Diagnosis not present

## 2021-01-13 ENCOUNTER — Other Ambulatory Visit: Payer: Self-pay | Admitting: Obstetrics and Gynecology

## 2021-01-13 DIAGNOSIS — Z1231 Encounter for screening mammogram for malignant neoplasm of breast: Secondary | ICD-10-CM

## 2021-01-16 DIAGNOSIS — M545 Low back pain, unspecified: Secondary | ICD-10-CM | POA: Diagnosis not present

## 2021-01-16 DIAGNOSIS — R531 Weakness: Secondary | ICD-10-CM | POA: Diagnosis not present

## 2021-01-16 DIAGNOSIS — R2689 Other abnormalities of gait and mobility: Secondary | ICD-10-CM | POA: Diagnosis not present

## 2021-01-17 DIAGNOSIS — R531 Weakness: Secondary | ICD-10-CM | POA: Diagnosis not present

## 2021-01-17 DIAGNOSIS — R2689 Other abnormalities of gait and mobility: Secondary | ICD-10-CM | POA: Diagnosis not present

## 2021-01-17 DIAGNOSIS — M545 Low back pain, unspecified: Secondary | ICD-10-CM | POA: Diagnosis not present

## 2021-01-26 DIAGNOSIS — M545 Low back pain, unspecified: Secondary | ICD-10-CM | POA: Diagnosis not present

## 2021-01-26 DIAGNOSIS — R531 Weakness: Secondary | ICD-10-CM | POA: Diagnosis not present

## 2021-01-26 DIAGNOSIS — R2689 Other abnormalities of gait and mobility: Secondary | ICD-10-CM | POA: Diagnosis not present

## 2021-01-31 DIAGNOSIS — M545 Low back pain, unspecified: Secondary | ICD-10-CM | POA: Diagnosis not present

## 2021-01-31 DIAGNOSIS — R2689 Other abnormalities of gait and mobility: Secondary | ICD-10-CM | POA: Diagnosis not present

## 2021-01-31 DIAGNOSIS — R531 Weakness: Secondary | ICD-10-CM | POA: Diagnosis not present

## 2021-02-07 ENCOUNTER — Emergency Department (HOSPITAL_COMMUNITY): Payer: BC Managed Care – PPO

## 2021-02-07 ENCOUNTER — Other Ambulatory Visit: Payer: Self-pay

## 2021-02-07 ENCOUNTER — Encounter (HOSPITAL_COMMUNITY): Payer: Self-pay | Admitting: Emergency Medicine

## 2021-02-07 ENCOUNTER — Emergency Department (HOSPITAL_COMMUNITY)
Admission: EM | Admit: 2021-02-07 | Discharge: 2021-02-07 | Disposition: A | Discharge status: Home / Self Care | Payer: BC Managed Care – PPO | Attending: Emergency Medicine | Admitting: Emergency Medicine

## 2021-02-07 ENCOUNTER — Other Ambulatory Visit: Payer: Self-pay | Admitting: Cardiology

## 2021-02-07 DIAGNOSIS — R000 Tachycardia, unspecified: Secondary | ICD-10-CM | POA: Diagnosis not present

## 2021-02-07 DIAGNOSIS — I10 Essential (primary) hypertension: Secondary | ICD-10-CM | POA: Diagnosis present

## 2021-02-07 DIAGNOSIS — E785 Hyperlipidemia, unspecified: Secondary | ICD-10-CM

## 2021-02-07 DIAGNOSIS — R079 Chest pain, unspecified: Secondary | ICD-10-CM | POA: Diagnosis not present

## 2021-02-07 DIAGNOSIS — Z79899 Other long term (current) drug therapy: Secondary | ICD-10-CM | POA: Insufficient documentation

## 2021-02-07 DIAGNOSIS — Z87891 Personal history of nicotine dependence: Secondary | ICD-10-CM | POA: Insufficient documentation

## 2021-02-07 DIAGNOSIS — I639 Cerebral infarction, unspecified: Secondary | ICD-10-CM | POA: Diagnosis present

## 2021-02-07 DIAGNOSIS — R0789 Other chest pain: Secondary | ICD-10-CM | POA: Diagnosis not present

## 2021-02-07 DIAGNOSIS — R0602 Shortness of breath: Secondary | ICD-10-CM | POA: Diagnosis not present

## 2021-02-07 DIAGNOSIS — E876 Hypokalemia: Secondary | ICD-10-CM | POA: Diagnosis not present

## 2021-02-07 DIAGNOSIS — R002 Palpitations: Secondary | ICD-10-CM | POA: Diagnosis not present

## 2021-02-07 DIAGNOSIS — I471 Supraventricular tachycardia: Secondary | ICD-10-CM

## 2021-02-07 DIAGNOSIS — I4719 Other supraventricular tachycardia: Secondary | ICD-10-CM

## 2021-02-07 DIAGNOSIS — Z8673 Personal history of transient ischemic attack (TIA), and cerebral infarction without residual deficits: Secondary | ICD-10-CM | POA: Diagnosis present

## 2021-02-07 LAB — CBC
HCT: 36.7 % (ref 36.0–46.0)
Hemoglobin: 11.6 g/dL — ABNORMAL LOW (ref 12.0–15.0)
MCH: 32.9 pg (ref 26.0–34.0)
MCHC: 31.6 g/dL (ref 30.0–36.0)
MCV: 104 fL — ABNORMAL HIGH (ref 80.0–100.0)
Platelets: 284 10*3/uL (ref 150–400)
RBC: 3.53 MIL/uL — ABNORMAL LOW (ref 3.87–5.11)
RDW: 12.7 % (ref 11.5–15.5)
WBC: 9.9 10*3/uL (ref 4.0–10.5)
nRBC: 0 % (ref 0.0–0.2)

## 2021-02-07 LAB — BASIC METABOLIC PANEL
Anion gap: 14 (ref 5–15)
BUN: 28 mg/dL — ABNORMAL HIGH (ref 6–20)
CO2: 25 mmol/L (ref 22–32)
Calcium: 9.7 mg/dL (ref 8.9–10.3)
Chloride: 100 mmol/L (ref 98–111)
Creatinine, Ser: 1.21 mg/dL — ABNORMAL HIGH (ref 0.44–1.00)
GFR, Estimated: 51 mL/min — ABNORMAL LOW (ref >60)
Glucose, Bld: 123 mg/dL — ABNORMAL HIGH (ref 70–99)
Potassium: 3.1 mmol/L — ABNORMAL LOW (ref 3.5–5.1)
Sodium: 139 mmol/L (ref 135–145)

## 2021-02-07 LAB — MAGNESIUM: Magnesium: 1.7 mg/dL (ref 1.7–2.4)

## 2021-02-07 LAB — TROPONIN I (HIGH SENSITIVITY): Troponin I (High Sensitivity): <2 ng/L (ref <18)

## 2021-02-07 LAB — I-STAT BETA HCG BLOOD, ED (MC, WL, AP ONLY): I-stat hCG, quantitative: <5 m[IU]/mL (ref <5)

## 2021-02-07 LAB — TSH: TSH: 2.382 u[IU]/mL (ref 0.350–4.500)

## 2021-02-07 MED ORDER — METOPROLOL TARTRATE 5 MG/5ML IV SOLN
5.0000 mg | Freq: Once | INTRAVENOUS | Status: AC
  Administered 2021-02-07: 5 mg via INTRAVENOUS
  Filled 2021-02-07: qty 5

## 2021-02-07 MED ORDER — POTASSIUM CHLORIDE CRYS ER 20 MEQ PO TBCR
40.0000 meq | EXTENDED_RELEASE_TABLET | Freq: Once | ORAL | Status: AC
  Administered 2021-02-07: 40 meq via ORAL
  Filled 2021-02-07: qty 2

## 2021-02-07 MED ORDER — METOPROLOL TARTRATE 50 MG PO TABS: 25.0000 mg | ORAL_TABLET | Freq: Two times a day (BID) | ORAL | 1 refills | Status: DC | PRN

## 2021-02-07 NOTE — Consult Note (Signed)
Cardiology Consultation:   Patient ID: [name] MRN: [id number]; DOB: [birth year]  Admit date: 02/07/2021 Date of Consult: 02/07/2021  PCP:  Derinda Late, MD   [place]  Cardiologist:  Dr. Stanford Breed Advanced Practice Provider:  No care team member to display Electrophysiologist:  None     Patient Profile:   [name] is a 60 y.o. female with a hx of hypertension, hyperlipidemia, stroke, PVCs, anemia who is being seen today for the evaluation of question atrial arrhythmia at the request of Dr. Jeannine Kitten family medicine/ED.  History of Present Illness:   [name] is a 60 year old female with above-mentioned past medical history who presents with sudden onset palpitations and heartburn after eating lunch today.  She had a [place] cheese and mustard sandwich and does not feel she had any worrisome foods for foodborne illness.  She became tachycardic at work.  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  She noticed some shortness of breath.  EKG at her surgical center showed sinus tachycardia but possible atrial fibrillation so she was brought to the emergency department.  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  She takes aspirin and statin as well as Zetia and was recently transition to Lipitor.  She is on antihypertensive therapy with metoprolol succinate 25 mg daily and losartan.  EKG reviewed showing sinus tachycardia and burst of atrial tachycardia.  Telemetry shows the same.  Laboratory studies are pending at the time of my evaluation in the ED.  I was called to the ED semiurgently to evaluate the patient prior to possible cardioversion.  Patient and I discussed that she is currently in sinus rhythm and cardioversion is not currently indicated.  She appears very comfortable sitting in bed without supplemental oxygen, her son [name] is at the bedside.   Past Medical  History:  Diagnosis Date  . Adenomatous polyps   . Anemia   . Blood transfusion without reported diagnosis 01/1999   following hysterectomy  . Bulging discs   . Endometriosis   . Hx of respiratory failure   . Hyperlipidemia   . Hypertension   . PVC (premature ventricular contraction)   . Stroke ([place])   . Urinary incontinence     Past Surgical History:  Procedure Laterality Date  . 5 back surgeries    . ABDOMINAL HYSTERECTOMY  2001   TAH/RSO  . AUGMENTATION MAMMAPLASTY Bilateral 2013   saline. pre pectoral  . BLADDER SUSPENSION  2003   LSO and rectocele repair [facility]  . BREAST SURGERY     breast augmentation--saline implants  . HERNIA REPAIR    . LUMBAR FUSION  2010   Dr. Carloyn Manner  . REDUCTION MAMMAPLASTY Bilateral   . SPINAL FUSION  2018   Dr. Ellene Route  . SPINE SURGERY       Home Medications:  Prior to Admission medications   Medication Sig Start Date End Date Taking? Authorizing Provider  albuterol (PROVENTIL) (2.5 MG/3ML) 0.083% nebulizer solution Take 3 mLs (2.5 mg total) by nebulization 2 (two) times daily as needed for wheezing or shortness of breath. 01/18/19   Kayleen Memos, DO  ALPRAZolam Duanne Moron) 0.5 MG tablet Take 0.5 mg by mouth daily as needed for anxiety. for anxiety 06/11/18   [provider]  buPROPion HCl ER, XL, 450 MG TB24 Take 450 mg by mouth daily.  01/03/20   [provider]  ezetimibe (ZETIA)  10 MG tablet Take 1 tablet (10 mg total) by mouth daily. 08/28/18   Frann Rider, NP  losartan (COZAAR) 100 MG tablet Take 100 mg by mouth daily.     [provider]  metoprolol succinate (TOPROL-XL) 25 MG 24 hr tablet Take 25 mg by mouth daily.    [provider]  oxyCODONE-acetaminophen (PERCOCET/ROXICET) 5-325 MG tablet Take 1 tablet by mouth every 6 (six) hours as needed. 04/01/20   [provider]  rosuvastatin (CRESTOR) 20 MG tablet Take 1 tablet (20 mg total) by mouth daily at 6 PM. 06/20/18   Rinehuls, Early Chars, PA-C   triamterene-hydrochlorothiazide (MAXZIDE-25) 37.5-25 MG tablet Take 1 tablet by mouth daily.    [provider]  zolpidem (AMBIEN) 10 MG tablet Take 10 mg by mouth at bedtime as needed for sleep.    [provider]    Inpatient Medications: Scheduled Meds:  Continuous Infusions:  PRN Meds:   Allergies:    Allergies  Allergen Reactions  . Ancef [Cefazolin] Hives    Cause rash shortness of breath and vomiting   . Ceftin [Cefuroxime] Nausea And Vomiting  . Penicillins Hives    Has patient had a PCN reaction causing immediate rash, facial/tongue/throat swelling, SOB or lightheadedness with hypotension: Yes Has patient had a PCN reaction causing severe rash involving mucus membranes or skin necrosis:Yes Has patient had a PCN reaction that required hospitalization:No Has patient had a PCN reaction occurring within the last 10 years: No If all of the above answers are "NO", then may proceed with Cephalosporin use.     Social History:   Social History   Socioeconomic History  . Marital status: Divorced    Spouse name: Not on file  . Number of children: 3  . Years of education: Not on file  . Highest education level: Not on file  Occupational History  . Not on file  Tobacco Use  . Smoking status: Former Smoker    Types: Cigarettes  . Smokeless tobacco: Never Used  Vaping Use  . Vaping Use: Never used  Substance and Sexual Activity  . Alcohol use: Yes    Alcohol/week: 6.0 - 8.0 standard drinks    Types: 6 - 8 Standard drinks or equivalent per week    Comment: occasionally   . Drug use: Yes    Types: Marijuana    Comment: occassionally  . Sexual activity: Not Currently    Partners: Male    Birth control/protection: Surgical    Comment: TAH  Other Topics Concern  . Not on file  Social History Narrative  . Not on file   Social Determinants of Health   Financial Resource Strain: Not on file  Food Insecurity: Not on file  Transportation Needs: Not  on file  Physical Activity: Not on file  Stress: Not on file  Social Connections: Not on file  Intimate Partner Violence: Not on file    Family History:    Family History  Problem Relation Age of Onset  . Hypertension Mother   . Stroke Mother   . Hypertension Father   . Cancer Father   . Hyperlipidemia Sister   . Hypertension Sister   . Hyperlipidemia Brother   . Hypertension Brother   . Hypertension Brother   . Hypertension Brother   . Cancer Maternal Uncle 72       colon ca  . Stroke Maternal Uncle   . Breast cancer Maternal Grandmother   . Cancer Maternal Grandmother 59  colon ca  . Stroke Maternal Aunt   . Stroke Paternal Grandfather      ROS:  Please see the history of present illness.   All other ROS reviewed and negative.     Physical Exam/Data:   Vitals:   02/07/21 1407 02/07/21 1430 02/07/21 1500 02/07/21 1515  BP: (!) 138/95 119/82 123/86 131/86  Pulse: (!) 107 (!) 141  97  Resp: 19 20 (!) 21 14  Temp:      SpO2: 100% 99%  98%   No intake or output data in the 24 hours ending 02/07/21 1555 Last 3 Weights 01/05/2020 09/15/2019 01/22/2019  Weight (lbs) 132 lb 139 lb 142 lb  Weight (kg) 59.875 kg 63.05 kg 64.411 kg     There is no height or weight on file to calculate BMI.  General:  Well nourished, well developed, in no acute distress HEENT: normal Neck: no JVD Endocrine:  No thryomegaly Vascular: No carotid bruits; FA pulses 2+ bilaterally without bruits  Cardiac:  normal S1, S2; RRR; no murmur  Lungs:  clear to auscultation bilaterally, no wheezing, rhonchi or rales  Abd: soft, nontender, no hepatomegaly  Ext: no edema Musculoskeletal:  No deformities, BUE and BLE strength normal and equal Skin: warm and dry  Neuro:  CNs 2-12 intact, no focal abnormalities noted Psych:  Normal affect   EKG:  The EKG was personally reviewed and demonstrates: Sinus rhythm with runs of atrial tachycardia Telemetry:  Telemetry was personally reviewed and  demonstrates: Sinus rhythm with atrial tachycardia  Relevant CV Studies: Exercise treadmill test in 2017 was without ischemia Echocardiogram January 2020 shows preserved EF, mild mitral valve regurgitation and trivial pericardial effusion.  Laboratory Data:  High Sensitivity Troponin:  No results for input(s): TROPONINIHS in the last 720 hours.   ChemistryNo results for input(s): NA, K, CL, CO2, GLUCOSE, BUN, CREATININE, CALCIUM, GFRNONAA, GFRAA, ANIONGAP in the last 168 hours.  No results for input(s): PROT, ALBUMIN, AST, ALT, ALKPHOS, BILITOT in the last 168 hours. Hematology Recent Labs  Lab 02/07/21 1316  WBC 9.9  RBC 3.53*  HGB 11.6*  HCT 36.7  MCV 104.0*  MCH 32.9  MCHC 31.6  RDW 12.7  PLT 284   BNPNo results for input(s): BNP, PROBNP in the last 168 hours.  DDimer No results for input(s): DDIMER in the last 168 hours.   Radiology/Studies:  DG Chest 2 View  Result Date: 02/07/2021 CLINICAL DATA:  Chest pain EXAM: CHEST - 2 VIEW COMPARISON:  03/09/2019 FINDINGS: The heart size and mediastinal contours are within normal limits. Both lungs are clear. The visualized skeletal structures are unremarkable. IMPRESSION: Negative. Electronically Signed   By: Rolm Baptise M.D.   On: 02/07/2021 14:03     Assessment and Plan:   Principal Problem:   Palpitations Active Problems:   HTN (hypertension)   Hyperlipidemia   Stroke (cerebrum) (HCC)   Atrial tachycardia (HCC)  Palpitations/atrial tachycardia -she demonstrates atrial tachycardia and short runs on telemetry of unclear etiology.  Denies increased caffeine or alcohol use within the past 24 hours. She takes metoprolol succinate at home 25 mg daily.  We discussed increasing the dose of her metoprolol home-going for 50 mg daily.  She feels she used to take metoprolol 50 mg daily but had dizziness on this dose.  I have instructed her that if she has dizziness on 50 mg she should decrease again to 25 mg daily.  I will also  recommend a short course of metoprolol tartrate 25  mg as needed palpitations.  There is no evidence of atrial fibrillation on telemetry today however if she demonstrates atrial fibrillation on 30-day monitor which we will order as an outpatient, would consider anticoagulation given her CHA2DS2-VASc score would be approximately 4 (hypertension, female, prior stroke).  Hypertension-blood pressure is reasonably well controlled at this time, will improve with increased dose of metoprolol.  Hyperlipidemia-continue Lipitor and Zetia, can be followed further as an outpatient.  Would recommend following up blood work prior to ED dismissal for the patient to ensure her electrolytes are stable and TSH is within the normal range.  I am happy to follow-up after laboratory studies have been completed for any additional recommendations needed.  Risk Assessment/Risk Scores:            [facility] will sign off.   Medication Recommendations:  As above Other recommendations (labs, testing, etc):  30 day event monitor for palpitations Follow up as an outpatient:  [place] team 4 weeks.  For questions or updates, please contact [place] Please consult [email or web address] for contact info under    Signed, Elouise Munroe, MD  02/07/2021 3:55 PM

## 2021-02-07 NOTE — Discharge Instructions (Addendum)
The office will set up for a cardiac monitor.  Your potassium level was slightly low, we have replaced it.  Please have your family doctor recheck your potassium in 1 week.  Eat foods rich in potassium  If you have palpitations despite the medications that you are taking you may take one of the metoprolol tartrate medications.  Do not mix up with the Toprol-XL which is the once a day medicine.  You should continue to take the Toprol-XL once a day even if you are not having palpitations  ER for worsening symptoms

## 2021-02-07 NOTE — ED Provider Notes (Signed)
[facility] EMERGENCY DEPARTMENT Provider Note   CSN: [id number] Arrival date & time: 02/07/21  1249     History Chief Complaint  Patient presents with  . Chest Pain  . Shortness of Breath    [name] is a 60 y.o. female.  Patient states she was at work today eating lunch and after she stood up from eating lunch she felt some heartburn type pain in her chest.  She also noticed that her heart was beating more rapidly than usual and she was feeling slightly short of breath.  This continued on and patient had a EKG done while at work that showed tachycardia with possible atrial fibrillation and she came to the emergency department.  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  She denies headache, cough, vomiting, abdominal pain.  Was in her normal state of health prior to the symptom onset at lunchtime.  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  She states she has a family history of high cholesterol as well as personal history.  Takes Zetia and was recently switched from Crestor to atorvastatin.  She also takes several antihypertensive medications including metoprolol XL 25 mg.  Patient has been on metoprolol for 20 years and is uncertain if it is due to having a history of A. fib she is unsure if she was ever diagnosed with A. fib in the past but states that her heart rate has been a regular rhythm, albeit tachycardic, as long as she can remember.  There is a family history of atrial fibrillation in her mother.        Past Medical History:  Diagnosis Date  . Adenomatous polyps   . Anemia   . Blood transfusion without reported diagnosis 01/1999   following hysterectomy  . Bulging discs   . Endometriosis   . Hx of respiratory failure   . Hyperlipidemia   . Hypertension   . PVC (premature ventricular contraction)   . Stroke ([place])   . Urinary incontinence     Patient Active Problem List   Diagnosis Date Noted  .  Altered mental status, unspecified 04/10/2020  . Acute respiratory failure with hypoxia ([place]) 01/16/2019  . Acute respiratory failure with hypoxemia ([place]) 01/15/2019  . Hyperglycemia 01/15/2019  . Pulmonary nodule 01/15/2019  . Stroke (cerebrum) ([place]) 06/18/2018  . Adjustment disorder with depressed mood 07/01/2017  . Lumbar stenosis with neurogenic claudication 02/09/2017  . Lumbar stenosis 02/09/2017  . Mixed incontinence 08/05/2014  . HTN (hypertension) 02/07/2012  . Depression 02/07/2012  . Dyslipidemia 02/07/2012  . Menopausal and perimenopausal disorder 02/07/2012    Past Surgical History:  Procedure Laterality Date  . 5 back surgeries    . ABDOMINAL HYSTERECTOMY  2001   TAH/RSO  . AUGMENTATION MAMMAPLASTY Bilateral 2013   saline. pre pectoral  . BLADDER SUSPENSION  2003   [facility]O and rect[facility]cele repair at [facility]  . BREAST SURGERY     breast augmentation--saline implants  . HERNIA REPAIR    . LUMBAR FUSION  2010   Dr. Carloyn Manner  . REDUCTION MAMMAPLASTY Bilateral   . SPINAL FUSION  2018   Dr. Ellene Route  . SPINE SURGERY       OB History    Gravida  5   Para  3   Term  3   Preterm      AB  2   Living  3     SAB  2  IAB      Ectopic      Multiple      Live Births              Family History  Problem Relation Age of Onset  . Hypertension Mother   . Stroke Mother   . Hypertension Father   . Cancer Father   . Hyperlipidemia Sister   . Hypertension Sister   . Hyperlipidemia Brother   . Hypertension Brother   . Hypertension Brother   . Hypertension Brother   . Cancer Maternal Uncle 72       colon ca  . Stroke Maternal Uncle   . Breast cancer Maternal Grandmother   . Cancer Maternal Grandmother 65       colon ca  . Stroke Maternal Aunt   . Stroke Paternal Grandfather     Social History   Tobacco Use  . Smoking status: Former Smoker    Types: Cigarettes  . Smokeless tobacco: Never Used  Vaping Use  . Vaping Use: Never used  Substance Use  Topics  . Alcohol use: Yes    Alcohol/week: 6.0 - 8.0 standard drinks    Types: 6 - 8 Standard drinks or equivalent per week    Comment: occasionally   . Drug use: Yes    Types: Marijuana    Comment: occassionally    Home Medications Prior to Admission medications   Medication Sig Start Date End Date Taking? Authorizing Provider  albuterol (PROVENTIL) (2.5 MG/3ML) 0.083% nebulizer solution Take 3 mLs (2.5 mg total) by nebulization 2 (two) times daily as needed for wheezing or shortness of breath. 01/18/19   Kayleen Memos, DO  ALPRAZolam Duanne Moron) 0.5 MG tablet Take 0.5 mg by mouth daily as needed for anxiety. for anxiety 06/11/18   [provider]  buPROPion HCl ER, XL, 450 MG TB24 Take 450 mg by mouth daily.  01/03/20   [provider]  ezetimibe (ZETIA) 10 MG tablet Take 1 tablet (10 mg total) by mouth daily. 08/28/18   Frann Rider, NP  losartan (COZAAR) 100 MG tablet Take 100 mg by mouth daily.     [provider]  metoprolol succinate (TOPROL-XL) 25 MG 24 hr tablet Take 25 mg by mouth daily.    [provider]  oxyCODONE-acetaminophen (PERCOCET/ROXICET) 5-325 MG tablet Take 1 tablet by mouth every 6 (six) hours as needed. 04/01/20   [provider]  rosuvastatin (CRESTOR) 20 MG tablet Take 1 tablet (20 mg total) by mouth daily at 6 PM. 06/20/18   Rinehuls, Early Chars, PA-C  triamterene-hydrochlorothiazide (MAXZIDE-25) 37.5-25 MG tablet Take 1 tablet by mouth daily.    [provider]  zolpidem (AMBIEN) 10 MG tablet Take 10 mg by mouth at bedtime as needed for sleep.    [provider]    Allergies    Ancef [cefazolin], Ceftin [cefuroxime], and Penicillins  Review of Systems   Review of Systems  All other systems reviewed and are negative.   Physical Exam Updated Vital Signs BP 131/86   Pulse 97   Temp 98.4 F (36.9 C)   Resp 14   SpO2 98%   Physical Exam Constitutional:      General: She is not in acute distress.     Appearance: She is normal weight.  HENT:     Head: Normocephalic and atraumatic.  Eyes:     Pupils: Pupils are equal, round, and reactive to light.  Cardiovascular:     Rate and Rhythm:  Tachycardia present. Rhythm irregular.     Heart sounds: No murmur heard.   Pulmonary:     Effort: Pulmonary effort is normal. No tachypnea or respiratory distress.     Breath sounds: Normal breath sounds.  Chest:     Chest wall: No mass or tenderness.  Abdominal:     General: Bowel sounds are normal.     Palpations: Abdomen is soft.  Musculoskeletal:        General: Normal range of motion.     Right lower leg: No tenderness. No edema.     Left lower leg: No tenderness. No edema.     Comments: 4/5 strength in LUE and LLE consistent with previous deficits from prior cva. 5/5 RUE and RLE.     Skin:    General: Skin is warm and dry.  Neurological:     General: No focal deficit present.     Mental Status: She is alert and oriented to person, place, and time.     Cranial Nerves: No cranial nerve deficit.  Psychiatric:        Mood and Affect: Mood is anxious.     ED Results / Procedures / Treatments   Labs (all labs ordered are listed, but only abnormal results are displayed) Labs Reviewed  CBC - Abnormal; Notable for the following components:      Result Value   RBC 3.53 (*)    Hemoglobin 11.6 (*)    MCV 104.0 (*)    All other components within normal limits  BASIC METABOLIC PANEL  TSH  MAGNESIUM  I-STAT BETA HCG BLOOD, ED ([facility], [facility], [facility] ONLY)  TROPONIN I (HIGH SENSITIVITY)  TROPONIN I (HIGH SENSITIVITY)    EKG EKG Interpretation  Date/Time:  Tuesday February 07 2021 13:07:09 EST Ventricular Rate:  154 PR Interval:  150 QRS Duration: 82 QT Interval:  260 QTC Calculation: 416 R Axis:   58 Text Interpretation: Sinus tachycardia with Premature supraventricular complexes Anterior infarct , age undetermined Abnormal ECG Confirmed by Isla Pence [phone]) on 02/07/2021 2:05:03  PM   Radiology DG Chest 2 View  Result Date: 02/07/2021 CLINICAL DATA:  Chest pain EXAM: CHEST - 2 VIEW COMPARISON:  03/09/2019 FINDINGS: The heart size and mediastinal contours are within normal limits. Both lungs are clear. The visualized skeletal structures are unremarkable. IMPRESSION: Negative. Electronically Signed   By: Rolm Baptise M.D.   On: 02/07/2021 14:03    Procedures Procedures   Medications Ordered in ED Medications  metoprolol tartrate (LOPRESSOR) injection 5 mg (5 mg Intravenous Given 02/07/21 1421)    ED Course  I have reviewed the triage vital signs and the nursing notes.  Pertinent labs & imaging results that were available during my care of the patient were reviewed by me and considered in my medical decision making (see chart for details).    MDM Rules/Calculators/A&P                          Patient presents to the ED after having sudden onset palpitations, chest discomfort, shortness of breath today at around lunchtime.  Has a history of CVA in 2019, hyperlipidemia on statin and Zetia, and history of tachycardia.  Patient has been on metoprolol for 20+ years but she is uncertain if she has never been diagnosed with atrial fibrillation in the past.  EKG shows an irregular rhythm with tachycardia.  Patient was given metoprolol IV 5 mg with subsequent improvement in heart rate.  Cardiology was consulted for possible A. fib with RVR and they will come assess the patient.  Patient without obvious risk factors for DVT.  PE unlikely.  Patient signed out to next shift provider.  Awaiting cardiology's recommendations.  Final Clinical Impression(s) / ED Diagnoses Final diagnoses:  Tachycardia  Palpitations    Rx / DC Orders ED Discharge Orders    None       Benay Pike, MD 02/07/21 1523    Isla Pence, MD 02/07/21 1552

## 2021-02-07 NOTE — ED Notes (Signed)
Patient transported to X-ray 

## 2021-02-07 NOTE — ED Triage Notes (Signed)
Patient coming from work. Reports sudden onset chest pain, shortness of breath. Patient states she feels like her heart is racing. Patient with hx of stroke.

## 2021-02-07 NOTE — ED Provider Notes (Signed)
Patient accepted at change of shift, cardiology is at the bedside and making recommendations for 30-day outpatient Holter monitor, they recommend increasing metoprolol in the morning to 50 mg instead of 25, the patient is in total agreement, in the room at this time she is in totally normal sinus rhythm, cardiology agrees there has been no signs of atrial fibrillation but possibly small runs of atrial tachycardia.  Labs partially back, some pending, patient is very comfortable with the plan at this point.  Cardiology also recommends continuing aspirin until follow-up  Potassium checked and is low at 3.1, this has been replaced, the patient is stable for discharge aware of the indications for return and has follow-up with cardiology   Noemi Chapel, MD 02/07/21 1811

## 2021-02-10 ENCOUNTER — Ambulatory Visit: Payer: BC Managed Care – PPO | Admitting: Obstetrics and Gynecology

## 2021-02-13 ENCOUNTER — Other Ambulatory Visit: Payer: Self-pay | Admitting: Orthopaedic Surgery

## 2021-02-13 DIAGNOSIS — R531 Weakness: Secondary | ICD-10-CM | POA: Diagnosis not present

## 2021-02-13 DIAGNOSIS — R2689 Other abnormalities of gait and mobility: Secondary | ICD-10-CM | POA: Diagnosis not present

## 2021-02-13 DIAGNOSIS — M545 Low back pain, unspecified: Secondary | ICD-10-CM | POA: Diagnosis not present

## 2021-02-13 DIAGNOSIS — M25561 Pain in right knee: Secondary | ICD-10-CM

## 2021-02-13 NOTE — Progress Notes (Deleted)
GYNECOLOGY  VISIT  CC:   ***  HPI: 60 y.o. [id number] Divorced White or Caucasian female here for ***.     GYNECOLOGIC HISTORY: No LMP recorded. Patient has had a hysterectomy. Contraception: *** Menopausal hormone therapy: ***  Patient Active Problem List   Diagnosis Date Noted  . Palpitations 02/07/2021  . Atrial tachycardia ([place]) 02/07/2021  . Altered mental status, unspecified 04/10/2020  . Acute respiratory failure with hypoxia ([place]) 01/16/2019  . Acute respiratory failure with hypoxemia ([place]) 01/15/2019  . Hyperglycemia 01/15/2019  . Pulmonary nodule 01/15/2019  . Stroke (cerebrum) ([place]) 06/18/2018  . Adjustment disorder with depressed mood 07/01/2017  . Lumbar stenosis with neurogenic claudication 02/09/2017  . Lumbar stenosis 02/09/2017  . Mixed incontinence 08/05/2014  . HTN (hypertension) 02/07/2012  . Depression 02/07/2012  . Hyperlipidemia 02/07/2012  . Menopausal and perimenopausal disorder 02/07/2012    Past Medical History:  Diagnosis Date  . Adenomatous polyps   . Anemia   . Blood transfusion without reported diagnosis 01/1999   following hysterectomy  . Bulging discs   . Endometriosis   . Hx of respiratory failure   . Hyperlipidemia   . Hypertension   . PVC (premature ventricular contraction)   . Stroke ([place])   . Urinary incontinence     Past Surgical History:  Procedure Laterality Date  . 5 back surgeries    . ABDOMINAL HYSTERECTOMY  2001   TAH/RSO  . AUGMENTATION MAMMAPLASTY Bilateral 2013   saline. pre pectoral  . BLADDER SUSPENSION  2003   LSO and rect[facility]cele repair at [facility]  . BREAST SURGERY     breast augmentation--saline implants  . HERNIA REPAIR    . LUMBAR FUSION  2010   Dr. Carloyn Manner  . REDUCTION MAMMAPLASTY Bilateral   . SPINAL FUSION  2018   Dr. Ellene Route  . SPINE SURGERY      MEDS:   Current Outpatient Medications on File Prior to Visit  Medication Sig Dispense Refill  . albuterol (PROVENTIL) (2.5 MG/3ML) 0.083% nebulizer solution  Take 3 mLs (2.5 mg total) by nebulization 2 (two) times daily as needed for wheezing or shortness of breath. 75 mL 0  . ALPRAZolam (XANAX) 0.5 MG tablet Take 0.5 mg by mouth daily as needed for anxiety. for anxiety  1  . buPROPion HCl ER, XL, 450 MG TB24 Take 450 mg by mouth daily.     Marland Kitchen ezetimibe (ZETIA) 10 MG tablet Take 1 tablet (10 mg total) by mouth daily. 30 tablet 4  . losartan (COZAAR) 100 MG tablet Take 100 mg by mouth daily.     . metoprolol succinate (TOPROL-XL) 25 MG 24 hr tablet Take 25 mg by mouth daily.    . metoprolol tartrate (LOPRESSOR) 50 MG tablet Take 0.5 tablets (25 mg total) by mouth 2 (two) times daily as needed for up to 20 days (palpitations). 40 tablet 1  . oxyCODONE-acetaminophen (PERCOCET/ROXICET) 5-325 MG tablet Take 1 tablet by mouth every 6 (six) hours as needed.    . rosuvastatin (CRESTOR) 20 MG tablet Take 1 tablet (20 mg total) by mouth daily at 6 PM. 30 tablet 11  . triamterene-hydrochlorothiazide (MAXZIDE-25) 37.5-25 MG tablet Take 1 tablet by mouth daily.    Marland Kitchen zolpidem (AMBIEN) 10 MG tablet Take 10 mg by mouth at bedtime as needed for sleep.     No current facility-administered medications on file prior to visit.    ALLERGIES: Ancef [cefazolin], Ceftin [cefuroxime], and Penicillins  Family History  Problem Relation Age of Onset  .  Hypertension Mother   . Stroke Mother   . Hypertension Father   . Cancer Father   . Hyperlipidemia Sister   . Hypertension Sister   . Hyperlipidemia Brother   . Hypertension Brother   . Hypertension Brother   . Hypertension Brother   . Cancer Maternal Uncle 72       colon ca  . Stroke Maternal Uncle   . Breast cancer Maternal Grandmother   . Cancer Maternal Grandmother 58       colon ca  . Stroke Maternal Aunt   . Stroke Paternal Grandfather     SH:  ***  Review of Systems  PHYSICAL EXAMINATION:    There were no vitals taken for this visit.    General appearance: alert, cooperative, no acute distress  CV:   {Exam; heart brief:31539} Lungs:  {pe lungs ob:314451::"clear to auscultation, no wheezes, rales or rhonchi, symmetric air entry"} Breasts: {Exam; breast:13139::"normal appearance, no masses or tenderness"} Abdomen: soft, non-tender; bowel sounds normal; no masses,  no organomegaly Lymph:  no inguinal LAD noted  Pelvic: External genitalia:  no lesions              Urethra:  normal appearing urethra with no masses, tenderness or lesions              Bartholins and Skenes: normal                 Vagina: normal appearing vagina               Cervix: {CHL AMB PHY EX CERVIX NORM DEFAULT:[phone]::"no lesions"}              Bimanual Exam:  Uterus:  {CHL AMB PHY EX UTERUS NORM DEFAULT:[phone]::"normal size, contour, position, consistency, mobility, non-tender"}              Adnexa: {CHL AMB PHY EX ADNEXA NO MASS DEFAULT:[phone]::"no mass, fullness, tenderness"}               Chaperone, ***, CMA, was present for exam.  Assessment: ***  Plan: ***   {NUMBERS; -10-45 JOINT ROM:10287} minutes of total time was spent for this patient encounter, including preparation, face-to-face counseling with the patient and coordination of care, and documentation of the encounter.

## 2021-02-14 ENCOUNTER — Ambulatory Visit: Payer: BC Managed Care – PPO | Admitting: Nurse Practitioner

## 2021-02-14 DIAGNOSIS — C4441 Basal cell carcinoma of skin of scalp and neck: Secondary | ICD-10-CM | POA: Diagnosis not present

## 2021-02-14 DIAGNOSIS — D229 Melanocytic nevi, unspecified: Secondary | ICD-10-CM | POA: Diagnosis not present

## 2021-02-14 DIAGNOSIS — D1801 Hemangioma of skin and subcutaneous tissue: Secondary | ICD-10-CM | POA: Diagnosis not present

## 2021-02-14 DIAGNOSIS — D485 Neoplasm of uncertain behavior of skin: Secondary | ICD-10-CM | POA: Diagnosis not present

## 2021-02-14 DIAGNOSIS — L570 Actinic keratosis: Secondary | ICD-10-CM | POA: Diagnosis not present

## 2021-02-14 DIAGNOSIS — L819 Disorder of pigmentation, unspecified: Secondary | ICD-10-CM | POA: Diagnosis not present

## 2021-02-15 ENCOUNTER — Ambulatory Visit (INDEPENDENT_AMBULATORY_CARE_PROVIDER_SITE_OTHER): Payer: BC Managed Care – PPO

## 2021-02-15 DIAGNOSIS — R002 Palpitations: Secondary | ICD-10-CM

## 2021-02-24 ENCOUNTER — Ambulatory Visit: Payer: BC Managed Care – PPO

## 2021-02-24 ENCOUNTER — Ambulatory Visit
Admission: RE | Admit: 2021-02-24 | Discharge: 2021-02-24 | Disposition: A | Discharge status: Home / Self Care | Payer: Worker's Compensation | Source: Ambulatory Visit | Attending: Orthopaedic Surgery | Admitting: Orthopaedic Surgery

## 2021-02-24 ENCOUNTER — Ambulatory Visit
Admission: RE | Admit: 2021-02-24 | Discharge: 2021-02-24 | Disposition: A | Discharge status: Home / Self Care | Payer: BC Managed Care – PPO | Source: Ambulatory Visit | Attending: Obstetrics and Gynecology | Admitting: Obstetrics and Gynecology

## 2021-02-24 ENCOUNTER — Other Ambulatory Visit: Payer: Self-pay

## 2021-02-24 DIAGNOSIS — Z1231 Encounter for screening mammogram for malignant neoplasm of breast: Secondary | ICD-10-CM

## 2021-02-24 DIAGNOSIS — M25561 Pain in right knee: Secondary | ICD-10-CM

## 2021-02-24 DIAGNOSIS — M7989 Other specified soft tissue disorders: Secondary | ICD-10-CM | POA: Diagnosis not present

## 2021-02-28 DIAGNOSIS — R531 Weakness: Secondary | ICD-10-CM | POA: Diagnosis not present

## 2021-02-28 DIAGNOSIS — R2689 Other abnormalities of gait and mobility: Secondary | ICD-10-CM | POA: Diagnosis not present

## 2021-02-28 DIAGNOSIS — M545 Low back pain, unspecified: Secondary | ICD-10-CM | POA: Diagnosis not present

## 2021-03-09 DIAGNOSIS — M545 Low back pain, unspecified: Secondary | ICD-10-CM | POA: Diagnosis not present

## 2021-03-09 DIAGNOSIS — R531 Weakness: Secondary | ICD-10-CM | POA: Diagnosis not present

## 2021-03-09 DIAGNOSIS — R2689 Other abnormalities of gait and mobility: Secondary | ICD-10-CM | POA: Diagnosis not present

## 2021-03-10 DIAGNOSIS — C4441 Basal cell carcinoma of skin of scalp and neck: Secondary | ICD-10-CM | POA: Diagnosis not present

## 2021-03-13 DIAGNOSIS — R2689 Other abnormalities of gait and mobility: Secondary | ICD-10-CM | POA: Diagnosis not present

## 2021-03-13 DIAGNOSIS — R531 Weakness: Secondary | ICD-10-CM | POA: Diagnosis not present

## 2021-03-13 DIAGNOSIS — M545 Low back pain, unspecified: Secondary | ICD-10-CM | POA: Diagnosis not present

## 2021-03-14 DIAGNOSIS — E782 Mixed hyperlipidemia: Secondary | ICD-10-CM | POA: Diagnosis not present

## 2021-03-20 ENCOUNTER — Ambulatory Visit: Payer: BC Managed Care – PPO | Admitting: Physician Assistant

## 2021-03-23 DIAGNOSIS — R2689 Other abnormalities of gait and mobility: Secondary | ICD-10-CM | POA: Diagnosis not present

## 2021-03-23 DIAGNOSIS — R531 Weakness: Secondary | ICD-10-CM | POA: Diagnosis not present

## 2021-03-23 DIAGNOSIS — M545 Low back pain, unspecified: Secondary | ICD-10-CM | POA: Diagnosis not present

## 2021-03-24 ENCOUNTER — Encounter: Payer: Self-pay | Admitting: Cardiology

## 2021-03-28 DIAGNOSIS — R2689 Other abnormalities of gait and mobility: Secondary | ICD-10-CM | POA: Diagnosis not present

## 2021-03-28 DIAGNOSIS — I7300 Raynaud's syndrome without gangrene: Secondary | ICD-10-CM | POA: Diagnosis not present

## 2021-03-28 DIAGNOSIS — I10 Essential (primary) hypertension: Secondary | ICD-10-CM | POA: Diagnosis not present

## 2021-03-28 DIAGNOSIS — R531 Weakness: Secondary | ICD-10-CM | POA: Diagnosis not present

## 2021-03-28 DIAGNOSIS — E782 Mixed hyperlipidemia: Secondary | ICD-10-CM | POA: Diagnosis not present

## 2021-03-28 DIAGNOSIS — M545 Low back pain, unspecified: Secondary | ICD-10-CM | POA: Diagnosis not present

## 2021-03-28 DIAGNOSIS — R000 Tachycardia, unspecified: Secondary | ICD-10-CM | POA: Diagnosis not present

## 2021-03-30 ENCOUNTER — Ambulatory Visit: Payer: BC Managed Care – PPO | Admitting: Physician Assistant

## 2021-04-13 ENCOUNTER — Telehealth: Payer: Self-pay | Admitting: Cardiology

## 2021-04-13 NOTE — Telephone Encounter (Signed)
Patient is requesting to go over her monitor results. She would like a call back after 4:00 PM, when she is off work.

## 2021-04-14 NOTE — Telephone Encounter (Signed)
Called patient, LVM to call back on Monday if questions.  Gave patient normal results via voicemail.

## 2021-04-19 DIAGNOSIS — R531 Weakness: Secondary | ICD-10-CM | POA: Diagnosis not present

## 2021-04-19 DIAGNOSIS — R2689 Other abnormalities of gait and mobility: Secondary | ICD-10-CM | POA: Diagnosis not present

## 2021-04-19 DIAGNOSIS — M545 Low back pain, unspecified: Secondary | ICD-10-CM | POA: Diagnosis not present

## 2021-04-19 NOTE — Progress Notes (Signed)
60 y.o. [id number] Divorced Caucasian female here for annual exam.    Had an episode of atrial fibrillation at work.  On a beta blocker.   Has had Covid twice.  Did one booster.   No new partner.   Still having urinary leakage. Up frequently at night.  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  Sometimes can leak with cough, laugh, sneeze, physical effort.  Declines treatment.   1 cup coffee per day.   Has left inguinal discomfort.   Has left supraclavicular swelling.   Bowel function is good.  Son engaged to be married.   PCP:   Derinda Late, MD  No LMP recorded. Patient has had a hysterectomy.           Sexually active: Yes.    The current method of family planning is status post hysterectomy.    Exercising: Yes.    working out at gym Smoker:  no  Health Maintenance: Pap: 2013 normal History of abnormal Pap:  Yes, 1995 hx of cryotherapy to cervix. MMG: 02-24-21 3D w/Saline Implants/Neg/BiRads1 Colonoscopy:  10-24-15 Dr.Outlaw;next 5 years--per PCP not due BMD: 2011  Result :Normal--appt. 05-27-21 TDaP:  2013 Gardasil:   no HIV:01-16-19 NR Hep C:04-26-13 Neg Screening Labs:  PCP.    reports that she has quit smoking. Her smoking use included cigarettes. She has never used smokeless tobacco. She reports current alcohol use of about 6.0 - 8.0 standard drinks of alcohol per week. She reports previous drug use.  Past Medical History:  Diagnosis Date  . Adenomatous polyps   . Anemia   . Blood transfusion without reported diagnosis 01/1999   following hysterectomy  . Bulging discs   . Endometriosis   . History of atrial fibrillation   . History of COVID-19    12/2018, 10/2020  . Hx of respiratory failure   . Hyperlipidemia   . Hypertension   . PVC (premature ventricular contraction)   . Stroke ([place])   . Urinary incontinence     Past Surgical History:  Procedure Laterality Date  . 5 back surgeries    . ABDOMINAL HYSTERECTOMY  2001   TAH/RSO  . AUGMENTATION MAMMAPLASTY Bilateral  2013   saline. pre pectoral  . BLADDER SUSPENSION  2003   LSO and rectocele repair at [facility]  . BREAST SURGERY     breast augmentation--saline implants  . HERNIA REPAIR    . LUMBAR FUSION  2010   Dr. Carloyn Manner  . REDUCTION MAMMAPLASTY Bilateral   . SPINAL FUSION  2018   Dr. Ellene Route  . SPINAL FUSION  04/06/2020  . SPINE SURGERY      Current Outpatient Medications  Medication Sig Dispense Refill  . albuterol (PROVENTIL) (2.5 MG/3ML) 0.083% nebulizer solution Take 3 mLs (2.5 mg total) by nebulization 2 (two) times daily as needed for wheezing or shortness of breath. 75 mL 0  . ALPRAZolam (XANAX) 0.5 MG tablet Take 0.5 mg by mouth daily as needed for anxiety. for anxiety  1  . atorvastatin (LIPITOR) 40 MG tablet Take 1 tablet by mouth daily.    Marland Kitchen buPROPion (ZYBAN) 150 MG 12 hr tablet 1 tablet in the morning    . cholecalciferol (VITAMIN D3) 25 MCG (1000 UNIT) tablet Take 1,000 Units by mouth daily.    Marland Kitchen ezetimibe (ZETIA) 10 MG tablet Take 1 tablet (10 mg total) by mouth daily. 30 tablet 4  . fluticasone (FLONASE) 50 MCG/ACT nasal spray SPRAY 2 SPRAY ONCE A DAY    . fluticasone-salmeterol (WIXELA INHUB) 250-50  MCG/ACT AEPB Inhale 1 puff into the lungs in the morning and at bedtime.    Marland Kitchen losartan (COZAAR) 100 MG tablet Take 100 mg by mouth daily.     . methocarbamol (ROBAXIN) 500 MG tablet take 1 tablet by oral route  every 6 hours as needed for muscle spasm as needed    . metoprolol succinate (TOPROL-XL) 25 MG 24 hr tablet Take 25 mg by mouth daily.    . metoprolol tartrate (LOPRESSOR) 25 MG tablet 1 tablet with food    . predniSONE (DELTASONE) 10 MG tablet Take by mouth daily as needed.    . triamterene-hydrochlorothiazide (MAXZIDE-25) 37.5-25 MG tablet Take 1 tablet by mouth daily.    . Turmeric 500 MG CAPS 1 capsule    . zolpidem (AMBIEN) 10 MG tablet Take 10 mg by mouth at bedtime as needed for sleep.     No current facility-administered medications for this visit.    Family History   Problem Relation Age of Onset  . Hypertension Mother   . Stroke Mother   . Hypertension Father   . Cancer Father   . Dementia Father   . Hyperlipidemia Sister   . Hypertension Sister   . Hyperlipidemia Brother   . Hypertension Brother   . Hypertension Brother   . Hypertension Brother   . Cancer Maternal Uncle 72       colon ca  . Stroke Maternal Uncle   . Breast cancer Maternal Grandmother   . Cancer Maternal Grandmother 41       colon ca  . Stroke Maternal Aunt   . Stroke Paternal Grandfather     Review of Systems  All other systems reviewed and are negative.   Exam:   BP 108/70   Pulse 70   Resp 16   Ht 5\' 3"  (1.6 m)   Wt 139 lb (63 kg)   BMI 24.62 kg/m     General appearance: alert, cooperative and appears stated age Head: normocephalic, without obvious abnormality, atraumatic Neck: no adenopathy, supple, symmetrical, trachea midline and thyroid normal to inspection and palpation.  Mild soft tissue swelling above left clavicle.  Lungs: clear to auscultation bilaterally Breasts: normal appearance, bilateral implants, no masses or tenderness, No nipple retraction or dimpling, No nipple discharge or bleeding, No axillary adenopathy Heart: regular rate and rhythm Abdomen: soft, non-tender; no masses, no organomegaly Extremities: extremities normal, atraumatic, no cyanosis or edema Skin: skin color, texture, turgor normal. No rashes or lesions Lymph nodes: cervical, supraclavicular, and axillary nodes normal. Neurologic: grossly normal  Pelvic: External genitalia:  no lesions              No abnormal inguinal nodes palpated.              Urethra:  normal appearing urethra with no masses, tenderness or lesions              Bartholins and Skenes: normal                 Vagina: normal appearing vagina with normal color and discharge, no lesions.  Good support.  No mesh exposure.               Cervix: absent              Pap taken: No. Bimanual Exam:  Uterus:   absent              Adnexa: no mass, fullness, tenderness  Rectal exam: Yes.  .  Confirms.              Anus:  normal sphincter tone, no lesions.  Mild erythema of skin around rectum.  Chaperone was present for exam.  Assessment:   Well woman visit with normal exam. Status post TAH/RSO. Remote hx abnormal paps. Status post LSO/sacrocolpopexy.  Status post midurethral sling.   Urinary urgency and frequency.  Stress incontinence. Bilateral breast implants. Hx stroke.  Hx respiratory failure.  Likely was Covid. Episode of atrial fibrillation.  Left supraclavicular swelling.  Does note feel discrete like a node.   Plan: Mammogram screening discussed. Self breast awareness reviewed. Pap and HR HPV as above. Guidelines for Calcium, Vitamin D, regular exercise program including cardiovascular and weight bearing exercise. BMD ordered by PCP.  Labs with PCP. She will contact her PCP regarding the supraclavicular swelling.  May benefit form neck ultrasound.  Follow up annually and prn.

## 2021-04-20 ENCOUNTER — Encounter: Payer: Self-pay | Admitting: Obstetrics and Gynecology

## 2021-04-20 ENCOUNTER — Ambulatory Visit (INDEPENDENT_AMBULATORY_CARE_PROVIDER_SITE_OTHER): Payer: BC Managed Care – PPO | Admitting: Obstetrics and Gynecology

## 2021-04-20 ENCOUNTER — Other Ambulatory Visit: Payer: Self-pay

## 2021-04-20 VITALS — BP 108/70 | HR 70 | Resp 16 | Ht 63.0 in | Wt 139.0 lb

## 2021-04-20 DIAGNOSIS — Z01419 Encounter for gynecological examination (general) (routine) without abnormal findings: Secondary | ICD-10-CM | POA: Diagnosis not present

## 2021-04-20 NOTE — Patient Instructions (Signed)

## 2021-04-25 DIAGNOSIS — M545 Low back pain, unspecified: Secondary | ICD-10-CM | POA: Diagnosis not present

## 2021-04-25 DIAGNOSIS — R531 Weakness: Secondary | ICD-10-CM | POA: Diagnosis not present

## 2021-04-25 DIAGNOSIS — R2689 Other abnormalities of gait and mobility: Secondary | ICD-10-CM | POA: Diagnosis not present

## 2021-05-18 DIAGNOSIS — M7062 Trochanteric bursitis, left hip: Secondary | ICD-10-CM | POA: Diagnosis not present

## 2021-05-18 DIAGNOSIS — R2689 Other abnormalities of gait and mobility: Secondary | ICD-10-CM | POA: Diagnosis not present

## 2021-05-18 DIAGNOSIS — R531 Weakness: Secondary | ICD-10-CM | POA: Diagnosis not present

## 2021-05-18 DIAGNOSIS — M545 Low back pain, unspecified: Secondary | ICD-10-CM | POA: Diagnosis not present

## 2021-05-22 NOTE — Progress Notes (Signed)
Cardiology Clinic Note   [name] Date of Encounter: 05/24/2021  Primary Care Provider:  Derinda Late, MD Primary Cardiologist:  Kirk Ruths, MD  Patient Profile    [name] 60 year old female presents the clinic today for follow-up evaluation of her palpitations.  Past Medical History    Past Medical History:  Diagnosis Date  . Adenomatous polyps   . Anemia   . Blood transfusion without reported diagnosis 01/1999   following hysterectomy  . Bulging discs   . Endometriosis   . History of atrial fibrillation   . History of COVID-19    12/2018, 10/2020  . Hx of respiratory failure   . Hyperlipidemia   . Hypertension   . PVC (premature ventricular contraction)   . Stroke ([place])   . Urinary incontinence    Past Surgical History:  Procedure Laterality Date  . 5 back surgeries    . ABDOMINAL HYSTERECTOMY  2001   TAH/RSO  . AUGMENTATION MAMMAPLASTY Bilateral 2013   saline. pre pectoral  . BLADDER SUSPENSION  2003   LSO and rect[facility]cele repair at [facility]  . BREAST SURGERY     breast augmentation--saline implants  . HERNIA REPAIR    . LUMBAR FUSION  2010   Dr. Carloyn Manner  . REDUCTION MAMMAPLASTY Bilateral   . SPINAL FUSION  2018   Dr. Ellene Route  . SPINAL FUSION  04/06/2020  . SPINE SURGERY      Allergies  Allergies  Allergen Reactions  . Ancef [Cefazolin] Hives    Cause rash shortness of breath and vomiting   . Ceftin [Cefuroxime] Nausea And Vomiting  . Penicillins Hives and Other (See Comments)    Has patient had a PCN reaction causing immediate rash, facial/tongue/throat swelling, SOB or lightheadedness with hypotension: Yes Has patient had a PCN reaction causing severe rash involving mucus membranes or skin necrosis:Yes Has patient had a PCN reaction that required hospitalization:No Has patient had a PCN reaction occurring within the last 10 years: No If all of the above answers are "NO", then may proceed with Cephalosporin use.   .  Gabapentin   . Other Other (See Comments)    History of Present Illness    [name] is a PMH of HTN, HLD, CVA, PVCs, anemia, and palpitations.  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  She presented to the emergency department on 02/07/2021 and was discharged on 02/08/2021.  She reported she had a sudden onset of palpitations and heartburn after eating lunch.  She consumed a [place] sandwich and became tachycardic at work.  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  She did note some shortness of breath.  Her EKG at the surgery  center showed sinus tachycardia possible atrial fibrillation.  Her repeat EKG showed sinus rhythm.  She was ruled out for MI and was discharged in stable condition.  A cardiac event monitor was ordered and showed no significant arrhythmias.  She presents to the clinic today for follow-up evaluation states she continues to notice occasional episodes of palpitations that last for seconds and dicipate with rest. She reports that she has not had to take her prn metoprolol tartrate. She reports compliance with her metoprolol succinate. The palpitations are most often noticed with increased physical activity. We reviewed triggers for palpitations and she expressed understanding. Her potassium was previously low.  We reviewed her blood pressure medications.  She reports  that her blood pressures have been low 85Y systolic.  I will give her a blood pressure log, stop her triamterene, and continue her hydrochlorothiazide as well as her other blood pressure medications.  I will repeat her BMP today, have her avoid triggers, increase her physical activity as tolerated and have her follow up in 1 month.   Today she denies chest pain, shortness of breath, lower extremity edema, fatigue, palpitations, melena, hematuria, hemoptysis, diaphoresis, weakness, presyncope, syncope, orthopnea, and  PND.   Home Medications    Prior to Admission medications   Medication Sig Start Date End Date Taking? Authorizing Provider  albuterol (PROVENTIL) (2.5 MG/3ML) 0.083% nebulizer solution Take 3 mLs (2.5 mg total) by nebulization 2 (two) times daily as needed for wheezing or shortness of breath. 01/18/19   Kayleen Memos, DO  ALPRAZolam Duanne Moron) 0.5 MG tablet Take 0.5 mg by mouth daily as needed for anxiety. for anxiety 06/11/18   [provider]  atorvastatin (LIPITOR) 40 MG tablet Take 1 tablet by mouth daily. 04/14/21   [provider]  buPROPion (ZYBAN) 150 MG 12 hr tablet 1 tablet in the morning    [provider]  cholecalciferol (VITAMIN D3) 25 MCG (1000 UNIT) tablet Take 1,000 Units by mouth daily.    [provider]  ezetimibe (ZETIA) 10 MG tablet Take 1 tablet (10 mg total) by mouth daily. 08/28/18   Frann Rider, NP  fluticasone (FLONASE) 50 MCG/ACT nasal spray SPRAY 2 SPRAY ONCE A DAY 04/07/21   [provider]  fluticasone-salmeterol (WIXELA INHUB) 250-50 MCG/ACT AEPB Inhale 1 puff into the lungs in the morning and at bedtime.    [provider]  losartan (COZAAR) 100 MG tablet Take 100 mg by mouth daily.     [provider]  methocarbamol (ROBAXIN) 500 MG tablet take 1 tablet by oral route  every 6 hours as needed for muscle spasm as needed    [provider]  metoprolol succinate (TOPROL-XL) 25 MG 24 hr tablet Take 25 mg by mouth daily.    [provider]  metoprolol tartrate (LOPRESSOR) 25 MG tablet 1 tablet with food    [provider]  predniSONE (DELTASONE) 10 MG tablet Take by mouth daily as needed. 01/09/21   [provider]  triamterene-hydrochlorothiazide (MAXZIDE-25) 37.5-25 MG tablet Take 1 tablet by mouth daily.    [provider]  Turmeric 500 MG CAPS 1 capsule    [provider]  zolpidem (AMBIEN) 10 MG tablet Take 10 mg by mouth at bedtime as needed for  sleep.    [provider]    Family History    Family History  Problem Relation Age of Onset  . Hypertension Mother   . Stroke Mother   . Hypertension Father   . Cancer Father   . Dementia Father   . Hyperlipidemia Sister   . Hypertension Sister   . Hyperlipidemia Brother   . Hypertension Brother   . Hypertension Brother   . Hypertension Brother   . Cancer Maternal Uncle 72       colon ca  . Stroke Maternal Uncle   . Breast cancer Maternal Grandmother   . Cancer Maternal Grandmother 48       colon ca  . Stroke Maternal Aunt   . Stroke Paternal Grandfather    She indicated that her mother is alive. She indicated that her father is alive. She indicated that her sister is alive. She indicated that all of her  three brothers are alive. She indicated that the status of her maternal grandmother is unknown. She indicated that her paternal grandfather is deceased. She indicated that her maternal aunt is deceased. She indicated that her maternal uncle is deceased.  Social History    Social History   Socioeconomic History  . Marital status: Divorced    Spouse name: Not on file  . Number of children: 3  . Years of education: Not on file  . Highest education level: Not on file  Occupational History  . Not on file  Tobacco Use  . Smoking status: Former Smoker    Types: Cigarettes  . Smokeless tobacco: Never Used  Vaping Use  . Vaping Use: Never used  Substance and Sexual Activity  . Alcohol use: Yes    Alcohol/week: 6.0 - 8.0 standard drinks    Types: 6 - 8 Standard drinks or equivalent per week    Comment: occasionally   . Drug use: Not Currently  . Sexual activity: Yes    Partners: Male    Birth control/protection: Surgical    Comment: TAH  Other Topics Concern  . Not on file  Social History Narrative  . Not on file   Social Determinants of Health   Financial Resource Strain: Not on file  Food Insecurity: Not on file  Transportation Needs: Not on file   Physical Activity: Not on file  Stress: Not on file  Social Connections: Not on file  Intimate Partner Violence: Not on file     Review of Systems    General:  No chills, fever, night sweats or weight changes.  Cardiovascular:  No chest pain, dyspnea on exertion, edema, orthopnea, palpitations, paroxysmal nocturnal dyspnea. Dermatological: No rash, lesions/masses Respiratory: No cough, dyspnea Urologic: No hematuria, dysuria Abdominal:   No nausea, vomiting, diarrhea, bright red blood per rectum, melena, or hematemesis Neurologic:  No visual changes, wkns, changes in mental status. All other systems reviewed and are otherwise negative except as noted above.  Physical Exam    VS:  BP 120/80   Ht 5\' 3"  (1.6 m)   Wt 142 lb (64.4 kg)   BMI 25.15 kg/m  , BMI Body mass index is 25.15 kg/m. GEN: Well nourished, well developed, in no acute distress. HEENT: normal. Neck: Supple, no JVD, carotid bruits, or masses. Cardiac: RRR, no murmurs, rubs, or gallops. No clubbing, cyanosis, edema.  Radials/DP/PT 2+ and equal bilaterally.  Respiratory:  Respirations regular and unlabored, clear to auscultation bilaterally. GI: Soft, nontender, nondistended, BS + x 4. MS: no deformity or atrophy. Skin: warm and dry, no rash. Neuro:  Strength and sensation are intact. Psych: Normal affect.  Accessory Clinical Findings    Recent Labs: 02/07/2021: BUN 28; Creatinine, Ser 1.21; Hemoglobin 11.6; Magnesium 1.7; Platelets 284; Potassium 3.1; Sodium 139; TSH 2.382   Recent Lipid Panel    Component Value Date/Time   CHOL 221 (H) 06/19/2018 1217   TRIG 161 (H) 06/19/2018 1217   HDL 107 06/19/2018 1217   CHOLHDL 2.1 06/19/2018 1217   VLDL 32 06/19/2018 1217   LDLCALC 82 06/19/2018 1217    ECG personally reviewed by me today- none today.   Cardiac event monitor 03/22/2021 Sinus bradycardia, normal sinus rhythm, sinus tachycardia. [place]   1.   Tachycardia/palpitations- no recent episodes of increased heart rate or irregular heartbeats.  Cardiac event monitor showed sinus bradycardia, sinus rhythm, sinus tachycardia. Continue metoprolol and PRN metoprolol tartrate Avoid triggers caffeine, chocolate, EtOH,  dehydration etc. Heart healthy low-sodium diet Increase physical activity as tolerated Increase po hydration  Hypokalemia- potassium noted to be 3.1 on 02/07/2021.  Creatinine slightly elevated at 1.21 which was at baseline.  Magnesium low normal at 1.7. Repeat BMP  Essential hypertension-BP today 120/80.  Well-controlled at home. Continue metoprolol, losartan,  HCTZ 25 daily, and PRN metoprolol tartrate Stop triamterene  Heart healthy low-sodium diet-salty 6 given Increase physical activity as tolerated Bp log.   Hyperlipidemia-LDL 82 on 06/19/2018 Continue atorvastatin Heart healthy low-sodium high-fiber diet Increase physical activity as tolerated   Disposition: Follow-up with Dr. Stanford Breed or me  in 1 months.    Jossie Ng. Symphanie Cederberg NP-C    05/24/2021, 4:28 PM [place] [address] Office [phone] Fax [phone]  Notice: This dictation was prepared with Dragon dictation along with smaller phrase technology. Any transcriptional errors that result from this process are unintentional and may not be corrected upon review.  I spent 15 minutes examining this patient, reviewing medications, and using patient centered shared decision making involving her cardiac care.  Prior to her visit I spent greater than 20 minutes reviewing her past medical history,  medications, and prior cardiac tests.

## 2021-05-24 ENCOUNTER — Ambulatory Visit (INDEPENDENT_AMBULATORY_CARE_PROVIDER_SITE_OTHER): Payer: BC Managed Care – PPO | Admitting: General Practice

## 2021-05-24 ENCOUNTER — Other Ambulatory Visit: Payer: Self-pay

## 2021-05-24 ENCOUNTER — Encounter: Payer: Self-pay | Admitting: General Practice

## 2021-05-24 VITALS — BP 120/80 | HR 83 | Ht 63.0 in | Wt 142.0 lb

## 2021-05-24 DIAGNOSIS — E7800 Pure hypercholesterolemia, unspecified: Secondary | ICD-10-CM

## 2021-05-24 DIAGNOSIS — R000 Tachycardia, unspecified: Secondary | ICD-10-CM | POA: Diagnosis not present

## 2021-05-24 DIAGNOSIS — I10 Essential (primary) hypertension: Secondary | ICD-10-CM

## 2021-05-24 DIAGNOSIS — R002 Palpitations: Secondary | ICD-10-CM

## 2021-05-24 DIAGNOSIS — E876 Hypokalemia: Secondary | ICD-10-CM

## 2021-05-24 MED ORDER — HYDROCHLOROTHIAZIDE 25 MG PO TABS: 25.0000 mg | ORAL_TABLET | Freq: Every day | ORAL | 3 refills | Status: DC

## 2021-05-24 MED ORDER — METOPROLOL TARTRATE 25 MG PO TABS: 25.0000 mg | ORAL_TABLET | ORAL | 1 refills | Status: DC | PRN

## 2021-05-24 NOTE — Patient Instructions (Signed)
Medication Instructions:  STOP taking maxzide  BEGIN hydrochlorothiazide 25mg  once a day.  Metoprolol tartrate 25mg  as needed refilled.   *If you need a refill on your cardiac medications before your next appointment, please call your pharmacy*   Lab Work: BMET, drawn today.    Testing/Procedures: None ordered.   Follow-Up: At [facility], you and your health needs are our priority.  As part of our continuing mission to provide you with exceptional heart care, we have created designated Provider Care Teams.  These Care Teams include your primary Cardiologist (physician) and Advanced Practice Providers (APPs -  Physician Assistants and Nurse Practitioners) who all work together to provide you with the care you need, when you need it.  We recommend signing up for the patient portal called "MyChart".  Sign up information is provided on this After Visit Summary.  MyChart is used to connect with patients for Virtual Visits (Telemedicine).  Patients are able to view lab/test results, encounter notes, upcoming appointments, etc.  Non-urgent messages can be sent to your provider as well.   To learn more about what you can do with MyChart, go to [email or web address].    Your next appointment:   1 month(s)  The format for your next appointment:   In Person  Provider:   Kirk Ruths, MD   Other Instructions Increase hydration, increase physical activity as tolerated.  Please keep a log of your blood pressures.

## 2021-05-25 LAB — BASIC METABOLIC PANEL
BUN/Creatinine Ratio: 26 (ref 12–28)
BUN: 34 mg/dL — ABNORMAL HIGH (ref 8–27)
CO2: 23 mmol/L (ref 20–29)
Calcium: 9.9 mg/dL (ref 8.7–10.3)
Chloride: 101 mmol/L (ref 96–106)
Creatinine, Ser: 1.33 mg/dL — ABNORMAL HIGH (ref 0.57–1.00)
Glucose: 91 mg/dL (ref 65–99)
Potassium: 4.5 mmol/L (ref 3.5–5.2)
Sodium: 142 mmol/L (ref 134–144)
eGFR: 46 mL/min/{1.73_m2} — ABNORMAL LOW (ref >59)

## 2021-06-01 DIAGNOSIS — M25552 Pain in left hip: Secondary | ICD-10-CM | POA: Diagnosis not present

## 2021-06-01 DIAGNOSIS — M545 Low back pain, unspecified: Secondary | ICD-10-CM | POA: Diagnosis not present

## 2021-06-01 DIAGNOSIS — R2689 Other abnormalities of gait and mobility: Secondary | ICD-10-CM | POA: Diagnosis not present

## 2021-06-01 DIAGNOSIS — R531 Weakness: Secondary | ICD-10-CM | POA: Diagnosis not present

## 2021-06-06 DIAGNOSIS — Z780 Asymptomatic menopausal state: Secondary | ICD-10-CM | POA: Diagnosis not present

## 2021-06-06 DIAGNOSIS — M545 Low back pain, unspecified: Secondary | ICD-10-CM | POA: Diagnosis not present

## 2021-06-06 DIAGNOSIS — R2689 Other abnormalities of gait and mobility: Secondary | ICD-10-CM | POA: Diagnosis not present

## 2021-06-06 DIAGNOSIS — R531 Weakness: Secondary | ICD-10-CM | POA: Diagnosis not present

## 2021-06-10 ENCOUNTER — Ambulatory Visit (HOSPITAL_COMMUNITY): Payer: BC Managed Care – PPO | Admitting: Anesthesiology

## 2021-06-10 ENCOUNTER — Encounter (HOSPITAL_COMMUNITY)
Admission: RE | Disposition: A | Discharge status: Home / Self Care | Payer: Self-pay | Source: Home / Self Care | Attending: Orthopedic Surgery

## 2021-06-10 ENCOUNTER — Other Ambulatory Visit: Payer: Self-pay

## 2021-06-10 ENCOUNTER — Encounter (HOSPITAL_COMMUNITY): Payer: Self-pay | Admitting: Orthopedic Surgery

## 2021-06-10 ENCOUNTER — Ambulatory Visit (HOSPITAL_COMMUNITY)
Admission: RE | Admit: 2021-06-10 | Discharge: 2021-06-10 | Disposition: A | Discharge status: Home / Self Care | Payer: BC Managed Care – PPO | Attending: Orthopedic Surgery | Admitting: Orthopedic Surgery

## 2021-06-10 DIAGNOSIS — Z888 Allergy status to other drugs, medicaments and biological substances status: Secondary | ICD-10-CM | POA: Diagnosis not present

## 2021-06-10 DIAGNOSIS — Z87891 Personal history of nicotine dependence: Secondary | ICD-10-CM | POA: Insufficient documentation

## 2021-06-10 DIAGNOSIS — W2209XA Striking against other stationary object, initial encounter: Secondary | ICD-10-CM | POA: Insufficient documentation

## 2021-06-10 DIAGNOSIS — S86021A Laceration of right Achilles tendon, initial encounter: Secondary | ICD-10-CM | POA: Diagnosis not present

## 2021-06-10 DIAGNOSIS — Z880 Allergy status to penicillin: Secondary | ICD-10-CM | POA: Diagnosis not present

## 2021-06-10 DIAGNOSIS — S91011A Laceration without foreign body, right ankle, initial encounter: Secondary | ICD-10-CM | POA: Diagnosis not present

## 2021-06-10 DIAGNOSIS — S91311A Laceration without foreign body, right foot, initial encounter: Secondary | ICD-10-CM | POA: Diagnosis not present

## 2021-06-10 DIAGNOSIS — Z79899 Other long term (current) drug therapy: Secondary | ICD-10-CM | POA: Insufficient documentation

## 2021-06-10 DIAGNOSIS — Z881 Allergy status to other antibiotic agents status: Secondary | ICD-10-CM | POA: Diagnosis not present

## 2021-06-10 DIAGNOSIS — E785 Hyperlipidemia, unspecified: Secondary | ICD-10-CM | POA: Diagnosis not present

## 2021-06-10 DIAGNOSIS — J9601 Acute respiratory failure with hypoxia: Secondary | ICD-10-CM | POA: Diagnosis not present

## 2021-06-10 DIAGNOSIS — I10 Essential (primary) hypertension: Secondary | ICD-10-CM | POA: Diagnosis not present

## 2021-06-10 HISTORY — DX: Anxiety disorder, unspecified: F41.9

## 2021-06-10 HISTORY — DX: Nausea with vomiting, unspecified: R11.2

## 2021-06-10 HISTORY — DX: Other specified postprocedural states: Z98.890

## 2021-06-10 HISTORY — PX: I & D EXTREMITY: SHX5045

## 2021-06-10 HISTORY — DX: Unspecified asthma, uncomplicated: J45.909

## 2021-06-10 LAB — CBC
HCT: 38.5 % (ref 36.0–46.0)
Hemoglobin: 12.5 g/dL (ref 12.0–15.0)
MCH: 33.1 pg (ref 26.0–34.0)
MCHC: 32.5 g/dL (ref 30.0–36.0)
MCV: 101.9 fL — ABNORMAL HIGH (ref 80.0–100.0)
Platelets: 300 10*3/uL (ref 150–400)
RBC: 3.78 MIL/uL — ABNORMAL LOW (ref 3.87–5.11)
RDW: 13.2 % (ref 11.5–15.5)
WBC: 6.3 10*3/uL (ref 4.0–10.5)
nRBC: 0 % (ref 0.0–0.2)

## 2021-06-10 SURGERY — IRRIGATION AND DEBRIDEMENT EXTREMITY
Anesthesia: Monitor Anesthesia Care | Laterality: Right

## 2021-06-10 MED ORDER — SUCCINYLCHOLINE CHLORIDE 200 MG/10ML IV SOSY
PREFILLED_SYRINGE | INTRAVENOUS | Status: AC
  Filled 2021-06-10: qty 10

## 2021-06-10 MED ORDER — PROPOFOL 10 MG/ML IV BOLUS
INTRAVENOUS | Status: DC | PRN
  Administered 2021-06-10: 140 mg via INTRAVENOUS

## 2021-06-10 MED ORDER — ARTIFICIAL TEARS OPHTHALMIC OINT
TOPICAL_OINTMENT | OPHTHALMIC | Status: AC
  Filled 2021-06-10: qty 10.5

## 2021-06-10 MED ORDER — MIDAZOLAM HCL 5 MG/5ML IJ SOLN
INTRAMUSCULAR | Status: DC | PRN
  Administered 2021-06-10: 2 mg via INTRAVENOUS

## 2021-06-10 MED ORDER — LACTATED RINGERS IV SOLN: INTRAVENOUS | Status: DC | PRN

## 2021-06-10 MED ORDER — ROCURONIUM BROMIDE 10 MG/ML (PF) SYRINGE
PREFILLED_SYRINGE | INTRAVENOUS | Status: AC
  Filled 2021-06-10: qty 20

## 2021-06-10 MED ORDER — DEXAMETHASONE SODIUM PHOSPHATE 10 MG/ML IJ SOLN
INTRAMUSCULAR | Status: DC | PRN
  Administered 2021-06-10: 10 mg via INTRAVENOUS

## 2021-06-10 MED ORDER — FENTANYL CITRATE (PF) 100 MCG/2ML IJ SOLN
25.0000 ug | INTRAMUSCULAR | Status: DC | PRN
  Administered 2021-06-10 (×2): 50 ug via INTRAVENOUS

## 2021-06-10 MED ORDER — FENTANYL CITRATE (PF) 250 MCG/5ML IJ SOLN
INTRAMUSCULAR | Status: AC
  Filled 2021-06-10: qty 5

## 2021-06-10 MED ORDER — PROPOFOL 500 MG/50ML IV EMUL
INTRAVENOUS | Status: DC | PRN
  Administered 2021-06-10: 50 ug/kg/min via INTRAVENOUS

## 2021-06-10 MED ORDER — FENTANYL CITRATE (PF) 100 MCG/2ML IJ SOLN
INTRAMUSCULAR | Status: DC | PRN
  Administered 2021-06-10 (×2): 50 ug via INTRAVENOUS

## 2021-06-10 MED ORDER — METOPROLOL SUCCINATE ER 25 MG PO TB24
25.0000 mg | ORAL_TABLET | Freq: Once | ORAL | Status: AC
  Administered 2021-06-10: 25 mg via ORAL
  Filled 2021-06-10: qty 1

## 2021-06-10 MED ORDER — ONDANSETRON HCL 4 MG/2ML IJ SOLN: 4.0000 mg | Freq: Once | INTRAMUSCULAR | Status: DC | PRN

## 2021-06-10 MED ORDER — LIDOCAINE HCL (PF) 2 % IJ SOLN
INTRAMUSCULAR | Status: AC
  Filled 2021-06-10: qty 20

## 2021-06-10 MED ORDER — MIDAZOLAM HCL 2 MG/2ML IJ SOLN
INTRAMUSCULAR | Status: AC
  Filled 2021-06-10: qty 2

## 2021-06-10 MED ORDER — 0.9 % SODIUM CHLORIDE (POUR BTL) OPTIME
TOPICAL | Status: DC | PRN
  Administered 2021-06-10: 1000 mL

## 2021-06-10 MED ORDER — CLINDAMYCIN PHOSPHATE 900 MG/50ML IV SOLN
INTRAVENOUS | Status: DC | PRN
  Administered 2021-06-10: 900 mg via INTRAVENOUS

## 2021-06-10 MED ORDER — SUGAMMADEX SODIUM 200 MG/2ML IV SOLN
INTRAVENOUS | Status: DC | PRN
  Administered 2021-06-10: 200 mg via INTRAVENOUS

## 2021-06-10 MED ORDER — LACTATED RINGERS IV SOLN: INTRAVENOUS | Status: DC

## 2021-06-10 MED ORDER — PHENYLEPHRINE 40 MCG/ML (10ML) SYRINGE FOR IV PUSH (FOR BLOOD PRESSURE SUPPORT)
PREFILLED_SYRINGE | INTRAVENOUS | Status: AC
  Filled 2021-06-10: qty 10

## 2021-06-10 MED ORDER — ORAL CARE MOUTH RINSE: 15.0000 mL | Freq: Once | OROMUCOSAL | Status: AC

## 2021-06-10 MED ORDER — CLINDAMYCIN HCL 150 MG PO CAPS: 150.0000 mg | ORAL_CAPSULE | Freq: Four times a day (QID) | ORAL | 0 refills | Status: AC

## 2021-06-10 MED ORDER — ONDANSETRON HCL 4 MG/2ML IJ SOLN
INTRAMUSCULAR | Status: AC
  Filled 2021-06-10: qty 6

## 2021-06-10 MED ORDER — LIDOCAINE 2% (20 MG/ML) 5 ML SYRINGE
INTRAMUSCULAR | Status: DC | PRN
  Administered 2021-06-10: 40 mg via INTRAVENOUS

## 2021-06-10 MED ORDER — PHENYLEPHRINE HCL (PRESSORS) 10 MG/ML IV SOLN
INTRAVENOUS | Status: DC | PRN
  Administered 2021-06-10: 80 ug via INTRAVENOUS
  Administered 2021-06-10: 40 ug via INTRAVENOUS

## 2021-06-10 MED ORDER — ONDANSETRON HCL 4 MG/2ML IJ SOLN
INTRAMUSCULAR | Status: DC | PRN
  Administered 2021-06-10: 4 mg via INTRAVENOUS

## 2021-06-10 MED ORDER — PROPOFOL 10 MG/ML IV BOLUS
INTRAVENOUS | Status: AC
  Filled 2021-06-10: qty 20

## 2021-06-10 MED ORDER — ROCURONIUM BROMIDE 10 MG/ML (PF) SYRINGE
PREFILLED_SYRINGE | INTRAVENOUS | Status: DC | PRN
  Administered 2021-06-10: 50 mg via INTRAVENOUS

## 2021-06-10 MED ORDER — CHLORHEXIDINE GLUCONATE 0.12 % MT SOLN
15.0000 mL | Freq: Once | OROMUCOSAL | Status: AC
Start: 2021-06-10 — End: 2021-06-10
  Administered 2021-06-10: 15 mL via OROMUCOSAL

## 2021-06-10 MED ORDER — FENTANYL CITRATE (PF) 100 MCG/2ML IJ SOLN
100.0000 ug | Freq: Once | INTRAMUSCULAR | Status: AC
Start: 2021-06-10 — End: 2021-06-10
  Administered 2021-06-10: 50 ug via INTRAVENOUS

## 2021-06-10 MED ORDER — FENTANYL CITRATE (PF) 100 MCG/2ML IJ SOLN
INTRAMUSCULAR | Status: AC
  Filled 2021-06-10: qty 2

## 2021-06-10 MED ORDER — ACETAMINOPHEN 325 MG PO TABS: 650.0000 mg | ORAL_TABLET | Freq: Four times a day (QID) | ORAL | 1 refills | Status: DC | PRN

## 2021-06-10 SURGICAL SUPPLY — 25 items
BNDG COHESIVE 4X5 TAN STRL (GAUZE/BANDAGES/DRESSINGS) ×2 IMPLANT
BNDG ELASTIC 4X5.8 VLCR STR LF (GAUZE/BANDAGES/DRESSINGS) ×2 IMPLANT
BNDG GAUZE ELAST 4 BULKY (GAUZE/BANDAGES/DRESSINGS) ×2 IMPLANT
COVER SURGICAL LIGHT HANDLE (MISCELLANEOUS) ×2 IMPLANT
DRAPE EXTREMITY T 121X128X90 (DISPOSABLE) ×1 IMPLANT
DRAPE HALF SHEET 40X57 (DRAPES) ×4 IMPLANT
DRAPE IMP U-DRAPE 54X76 (DRAPES) ×1 IMPLANT
GAUZE SPONGE 4X4 12PLY STRL (GAUZE/BANDAGES/DRESSINGS) ×2 IMPLANT
GAUZE XEROFORM 1X8 LF (GAUZE/BANDAGES/DRESSINGS) ×2 IMPLANT
GLOVE BIO SURGEON STRL SZ7 (GLOVE) ×2 IMPLANT
GLOVE ORTHO TXT STRL SZ7.5 (GLOVE) ×2 IMPLANT
GLOVE SURG UNDER POLY LF SZ7 (GLOVE) ×2 IMPLANT
GOWN STRL REUS W/ TWL LRG LVL3 (GOWN DISPOSABLE) ×2 IMPLANT
GOWN STRL REUS W/TWL LRG LVL3 (GOWN DISPOSABLE) ×6
KIT BASIN OR (CUSTOM PROCEDURE TRAY) ×2 IMPLANT
KIT TURNOVER KIT B (KITS) ×2 IMPLANT
NS IRRIG 1000ML POUR BTL (IV SOLUTION) ×2 IMPLANT
PACK GENERAL/GYN (CUSTOM PROCEDURE TRAY) ×1 IMPLANT
PAD ABD 8X10 STRL (GAUZE/BANDAGES/DRESSINGS) ×1 IMPLANT
PAD ARMBOARD 7.5X6 YLW CONV (MISCELLANEOUS) ×4 IMPLANT
STOCKINETTE IMPERVIOUS 9X36 MD (GAUZE/BANDAGES/DRESSINGS) ×2 IMPLANT
SUT ETHILON 4 0 PS 2 18 (SUTURE) ×1 IMPLANT
TOWEL GREEN STERILE (TOWEL DISPOSABLE) ×2 IMPLANT
UNDERPAD 30X36 HEAVY ABSORB (UNDERPADS AND DIAPERS) ×2 IMPLANT
WATER STERILE IRR 1000ML POUR (IV SOLUTION) ×2 IMPLANT

## 2021-06-10 NOTE — H&P (Signed)
PREOPERATIVE H&P  Chief Complaint: right heel laceration  HPI: [name] is a 60 y.o. female who presents for preoperative history and physical with a diagnosis of right heel laceration that got cut on a screen door today, not sure if there was any lacerated tendon. Symptoms are rated as moderate to severe, and have been worsening.  This is significantly impairing activities of daily living.  She has elected for surgical management.   Past Medical History:  Diagnosis Date   Adenomatous polyps    Anemia    Anxiety    Asthma    Blood transfusion without reported diagnosis 01/1999   following hysterectomy   Bulging discs    Endometriosis    History of atrial fibrillation    History of COVID-19    12/2018, 10/2020   Hx of respiratory failure    Hyperlipidemia    Hypertension    PONV (postoperative nausea and vomiting)    PVC (premature ventricular contraction)    Stroke [facility])    Urinary incontinence    Past Surgical History:  Procedure Laterality Date   5 back surgeries     ABDOMINAL HYSTERECTOMY  2001   TAH/RSO   AUGMENTATION MAMMAPLASTY Bilateral 2013   saline. pre pectoral   BLADDER SUSPENSION  2003   LSO and rectocele repair at [place]     breast augmentation--saline implants   EPIDURAL BLOCK INJECTION     HERNIA REPAIR     LUMBAR FUSION  2010   Dr. Carloyn Manner   REDUCTION MAMMAPLASTY Bilateral    SPINAL FUSION  2018   Dr. Ellene Route   SPINAL FUSION  04/06/2020   SPINE SURGERY     Social History   Socioeconomic History   Marital status: Divorced    Spouse name: Not on file   Number of children: Not on file   Years of education: Not on file   Highest education level: Not on file  Occupational History   Not on file  Tobacco Use   Smoking status: Former    Pack years: 0.00    Types: Cigarettes   Smokeless tobacco: Never  Vaping Use   Vaping Use: Never used  Substance and Sexual Activity   Alcohol use: Yes    Alcohol/week: 6.0 - 8.0 standard  drinks    Types: 6 - 8 Standard drinks or equivalent per week    Comment: occasionally    Drug use: Not Currently   Sexual activity: Yes    Partners: Male    Birth control/protection: Surgical    Comment: TAH  Other Topics Concern   Not on file  Social History Narrative   Not on file   Social Determinants of Health   Financial Resource Strain: Not on file  Food Insecurity: Not on file  Transportation Needs: Not on file  Physical Activity: Not on file  Stress: Not on file  Social Connections: Not on file   Family History  Problem Relation Age of Onset   Hypertension Mother    Stroke Mother    Hypertension Father    Cancer Father    Dementia Father    Hyperlipidemia Sister    Hypertension Sister    Hyperlipidemia Brother    Hypertension Brother    Hypertension Brother    Hypertension Brother    Cancer Maternal Uncle 54       colon ca   Stroke Maternal Uncle    Breast cancer Maternal Grandmother    Cancer Maternal Grandmother  98       colon ca   Stroke Maternal Aunt    Stroke Paternal Grandfather    Allergies  Allergen Reactions   Ancef [Cefazolin] Hives    Cause rash shortness of breath and vomiting    Ceftin [Cefuroxime] Nausea And Vomiting   Penicillins Hives and Other (See Comments)    Has patient had a PCN reaction causing immediate rash, facial/tongue/throat swelling, SOB or lightheadedness with hypotension: Yes Has patient had a PCN reaction causing severe rash involving mucus membranes or skin necrosis:Yes Has patient had a PCN reaction that required hospitalization:No Has patient had a PCN reaction occurring within the last 10 years: No If all of the above answers are "NO", then may proceed with Cephalosporin use.    Gabapentin    Other Other (See Comments)   Prior to Admission medications   Medication Sig Start Date End Date Taking? Authorizing Provider  albuterol (PROVENTIL) (2.5 MG/3ML) 0.083% nebulizer solution Take 3 mLs (2.5 mg total) by  nebulization 2 (two) times daily as needed for wheezing or shortness of breath. 01/18/19  Yes Kayleen Memos, DO  ALPRAZolam (XANAX) 0.5 MG tablet Take 0.5 mg by mouth daily as needed for anxiety. for anxiety 06/11/18  Yes [provider]  atorvastatin (LIPITOR) 40 MG tablet Take 40 mg by mouth daily. 04/14/21  Yes [provider]  buPROPion (ZYBAN) 150 MG 12 hr tablet Take 150 mg by mouth daily.   Yes [provider]  ezetimibe (ZETIA) 10 MG tablet Take 1 tablet (10 mg total) by mouth daily. 08/28/18  Yes McCue, Janett Billow, NP  fluticasone (FLONASE) 50 MCG/ACT nasal spray Place 2 sprays into both nostrils daily as needed for rhinitis. 04/07/21  Yes [provider]  fluticasone-salmeterol (ADVAIR) 250-50 MCG/ACT AEPB Inhale 1 puff into the lungs 2 (two) times daily as needed (shortness of breath).   Yes [provider]  losartan (COZAAR) 100 MG tablet Take 100 mg by mouth daily.    Yes [provider]  metoprolol succinate (TOPROL-XL) 25 MG 24 hr tablet Take 25 mg by mouth daily.   Yes [provider]  metoprolol tartrate (LOPRESSOR) 25 MG tablet Take 1 tablet (25 mg total) by mouth as needed. For palpitations 05/24/21 06/23/21 Yes Cleaver, Jossie Ng, NP  triamterene-hydrochlorothiazide (DYAZIDE) 37.5-25 MG capsule Take 1 capsule by mouth daily.   Yes [provider]  zolpidem (AMBIEN) 10 MG tablet Take 5 mg by mouth at bedtime as needed for sleep.   Yes [provider]     Positive ROS: All other systems have been reviewed and were otherwise negative with the exception of those mentioned in the HPI and as above.  Physical Exam: General: Alert, no acute distress Cardiovascular: No pedal edema Respiratory: No cyanosis, no use of accessory musculature GI: No organomegaly, abdomen is soft and non-tender Skin: transverse laceration over achilles Neurologic: Sensation intact distally Psychiatric: Patient is competent for consent  with normal mood and affect  MUSCULOSKELETAL: right heel has laceration down to the tendon, although the thompson test seems intact.  Assessment: Right heel laceration, possible tendon involvement   Plan: Plan for Procedure(s): IRRIGATION AND DEBRIDEMENT ACHILLES, repair of injured structures.  The risks benefits and alternatives were discussed with the patient including but not limited to the risks of nonoperative treatment, versus surgical intervention including infection, bleeding, nerve injury,  blood clots, cardiopulmonary complications, morbidity, mortality, among others, and they were willing to proceed.    Johnny Bridge, MD  Cell [phone]   06/10/2021 3:05 PM

## 2021-06-10 NOTE — Transfer of Care (Signed)
Immediate Anesthesia Transfer of Care Note  Patient: [name]  Procedure(s) Performed: IRRIGATION AND DEBRIDEMENT ACHILLES (Right)  Patient Location: PACU  Anesthesia Type:General  Level of Consciousness: awake, alert  and oriented  Airway & Oxygen Therapy: Patient Spontanous Breathing  Post-op Assessment: Report given to RN and Post -op Vital signs reviewed and stable  Post vital signs: Reviewed and stable  Last Vitals:  Vitals Value Taken Time  BP 142/93 06/10/21 1835  Temp 36.3 C 06/10/21 1835  Pulse 87 06/10/21 1839  Resp 16 06/10/21 1839  SpO2 100 % 06/10/21 1839  Vitals shown include unvalidated device data.  Last Pain:  Vitals:   06/10/21 1354  TempSrc: Oral  PainSc: 5       Patients Stated Pain Goal: 5 (61/44/31 5400)  Complications: No notable events documented.

## 2021-06-10 NOTE — Anesthesia Postprocedure Evaluation (Signed)
Anesthesia Post Note  Patient: [name]  Procedure(s) Performed: IRRIGATION AND DEBRIDEMENT ACHILLES (Right)     Patient location during evaluation: PACU Anesthesia Type: MAC Level of consciousness: awake and alert Pain management: pain level controlled Vital Signs Assessment: post-procedure vital signs reviewed and stable Respiratory status: spontaneous breathing, nonlabored ventilation, respiratory function stable and patient connected to nasal cannula oxygen Cardiovascular status: blood pressure returned to baseline and stable Postop Assessment: no apparent nausea or vomiting Anesthetic complications: no   No notable events documented.  Last Vitals:  Vitals:   06/10/21 1850 06/10/21 1905  BP: 130/87 (!) 149/92  Pulse: 87 67  Resp: 20 12  Temp:    SpO2: 95% 96%    Last Pain:  Vitals:   06/10/21 1905  TempSrc:   PainSc: 3                  Damon Baisch COKER

## 2021-06-10 NOTE — Discharge Instructions (Signed)
Diet: As you were doing prior to hospitalization   Shower:  May shower but keep the wounds dry, use an occlusive plastic wrap, NO SOAKING IN TUB.  If the bandage gets wet, change with a clean dry gauze.   Dressing:  You may change your dressing 3-5 days after surgery. If you had hand or foot surgery, we will plan to remove your stitches in about 2 weeks in the office.  For all other surgeries, there are sticky tapes (steri-strips) on your wounds and all the stitches are absorbable.  Leave the steri-strips in place when changing your dressings, they will peel off with time, usually 2-3 weeks.  Activity:  Increase activity slowly as tolerated, but follow the weight bearing instructions below.  The rules on driving is that you can not be taking narcotics while you drive, and you must feel in control of the vehicle.    Weight Bearing:   Weight bearing as tolerated.  To prevent constipation: you may use a stool softener such as -  Colace (over the counter) 100 mg by mouth twice a day  Drink plenty of fluids (prune juice may be helpful) and high fiber foods Miralax (over the counter) for constipation as needed.    Itching:  If you experience itching with your medications, try taking only a single pain pill, or even half a pain pill at a time.  You may take up to 10 pain pills per day, and you can also use benadryl over the counter for itching or also to help with sleep.   Precautions:  If you experience chest pain or shortness of breath - call 911 immediately for transfer to the hospital emergency department!!  If you develop a fever greater that 101 F, purulent drainage from wound, increased redness or drainage from wound, or calf pain -- Call the office at [phone]                                                Follow- Up Appointment:  Please call for an appointment to be seen in 2 weeks [facility] - [phone]

## 2021-06-10 NOTE — Anesthesia Procedure Notes (Signed)
Procedure Name: Intubation Date/Time: 06/10/2021 5:50 PM Performed by: Suzy Bouchard, CRNA Pre-anesthesia Checklist: Patient identified, Emergency Drugs available, Suction available and Patient being monitored Patient Re-evaluated:Patient Re-evaluated prior to induction Oxygen Delivery Method: Circle system utilized Preoxygenation: Pre-oxygenation with 100% oxygen Induction Type: IV induction Ventilation: Mask ventilation without difficulty Laryngoscope Size: Miller and 2 Grade View: Grade I Tube type: Oral Tube size: 7.0 mm Number of attempts: 1 Airway Equipment and Method: Stylet and Oral airway Placement Confirmation: ETT inserted through vocal cords under direct vision, positive ETCO2 and breath sounds checked- equal and bilateral Secured at: 22 cm Tube secured with: Tape Dental Injury: Teeth and Oropharynx as per pre-operative assessment

## 2021-06-10 NOTE — Op Note (Signed)
06/10/2021  6:29 PM  PATIENT:  [name]    PRE-OPERATIVE DIAGNOSIS: Right posterior leg laceration overlying the Achilles, possible Achilles injury  POST-OPERATIVE DIAGNOSIS: Laceration, right posterior leg, overlying the Achilles, no injury to the Achilles  PROCEDURE: Exploration, right posterior leg laceration, with irrigation and closure.  There was no excision, but really more of a irrigation and debridement, I did not make any incision either.  SURGEON:  Johnny Bridge, MD  PHYSICIAN ASSISTANT: Merlene Pulling, PA-C, present and scrubbed throughout the case, critical for completion in a timely fashion, and for retraction, instrumentation, and closure.  ANESTHESIA:   General  PREOPERATIVE INDICATIONS:  [name] is a  60 y.o. female who had a complex laceration overlying the Achilles tendon today when her back of her leg got struck by a sliding screen door.  She elected for surgical exploration to make sure that the tendon was intact, as well as optimize the recovery and minimize the risk for infection.  The risks benefits and alternatives were discussed with the patient preoperatively including but not limited to the risks of infection, bleeding, nerve injury, cardiopulmonary complications, the need for revision surgery, among others, and the patient was willing to proceed.  ESTIMATED BLOOD LOSS: None  OPERATIVE IMPLANTS: None  OPERATIVE FINDINGS: The peritenon was all completely intact.  There was a transverse laceration approximately 3 cm in length across the Achilles tendon  OPERATIVE PROCEDURE: The patient was brought to the operating room and placed in supine position.  General anesthesia was administered.  Clindamycin was given given her antibiotic allergy to Ancef.  She was turned prone, and the right lower extremity was prepped and draped in usual sterile fashion.  Timeout was performed.  Tourniquet was not utilized.  The wound was explored, and I did not  identify any evidence for tendon disruption.  Thompson test was intact.  I irrigated the wounds copiously with a liter of fluid, and then repaired the skin with nylon.  Excellent repair was achieved.  Sterile gauze was applied, and the patient was awakened and returned the PACU in stable and satisfactory condition.  There were no complications and she tolerated the procedure well.  She can be weightbearing as tolerated, and can elevate and protect the wound and I will see her back in a week.  Debridement type: Incisional Debridement / Irrigation only with skin closure  Side: right  Body Location: skin over achilles   Tools used for debridement: none  Pre-debridement Wound size (cm):   Length: 3        Width: 1     Depth: 0.5   Post-debridement Wound size (cm):   Length: 0        Width: 0     Depth: 0   Debridement depth beyond dead/damaged tissue down to healthy viable tissue: yes  Tissue layer involved: skin, subcutaneous tissue, muscle / fascia  Nature of tissue removed: no tissue removed.  Irrigation volume: 1L     Irrigation fluid type: Normal Saline

## 2021-06-10 NOTE — Anesthesia Preprocedure Evaluation (Addendum)
Anesthesia Evaluation  Patient identified by MRN, date of birth, ID band Patient awake    Reviewed: Allergy & Precautions, NPO status , Patient's Chart, lab work & pertinent test results  Airway Mallampati: II  TM Distance: >3 FB Neck ROM: Full    Dental  (+) Teeth Intact   Pulmonary former smoker,    breath sounds clear to auscultation       Cardiovascular hypertension,  Rhythm:Regular Rate:Normal     Neuro/Psych    GI/Hepatic   Endo/Other    Renal/GU      Musculoskeletal   Abdominal   Peds  Hematology   Anesthesia Other Findings   Reproductive/Obstetrics                             Anesthesia Physical Anesthesia Plan  ASA: 2  Anesthesia Plan: General   Post-op Pain Management:    Induction: Intravenous  PONV Risk Score and Plan: Ondansetron, Dexamethasone and Propofol infusion  Airway Management Planned: Oral ETT  Additional Equipment:   Intra-op Plan:   Post-operative Plan:   Informed Consent: I have reviewed the patients History and Physical, chart, labs and discussed the procedure including the risks, benefits and alternatives for the proposed anesthesia with the patient or authorized representative who has indicated his/her understanding and acceptance.       Plan Discussed with: CRNA and Anesthesiologist  Anesthesia Plan Comments:        Anesthesia Quick Evaluation

## 2021-06-11 ENCOUNTER — Encounter (HOSPITAL_COMMUNITY): Payer: Self-pay | Admitting: Orthopedic Surgery

## 2021-06-15 ENCOUNTER — Ambulatory Visit: Payer: BC Managed Care – PPO | Admitting: General Practice

## 2021-06-15 DIAGNOSIS — R2689 Other abnormalities of gait and mobility: Secondary | ICD-10-CM | POA: Diagnosis not present

## 2021-06-15 DIAGNOSIS — R531 Weakness: Secondary | ICD-10-CM | POA: Diagnosis not present

## 2021-06-15 DIAGNOSIS — M545 Low back pain, unspecified: Secondary | ICD-10-CM | POA: Diagnosis not present

## 2021-06-20 ENCOUNTER — Other Ambulatory Visit: Payer: Self-pay | Admitting: General Practice

## 2021-06-20 DIAGNOSIS — R2689 Other abnormalities of gait and mobility: Secondary | ICD-10-CM | POA: Diagnosis not present

## 2021-06-20 DIAGNOSIS — R531 Weakness: Secondary | ICD-10-CM | POA: Diagnosis not present

## 2021-06-20 DIAGNOSIS — M545 Low back pain, unspecified: Secondary | ICD-10-CM | POA: Diagnosis not present

## 2021-06-27 NOTE — Progress Notes (Deleted)
Cardiology Clinic Note   [name] Date of Encounter: 06/27/2021  Primary Care Provider:  Derinda Late, MD Primary Cardiologist:  Kirk Ruths, MD  Patient Profile    [name] 60 year old female presents the clinic today for follow-up evaluation of her palpitations.  Past Medical History    Past Medical History:  Diagnosis Date   Adenomatous polyps    Anemia    Anxiety    Asthma    Blood transfusion without reported diagnosis 01/1999   following hysterectomy   Bulging discs    Endometriosis    History of atrial fibrillation    History of COVID-19    12/2018, 10/2020   Hx of respiratory failure    Hyperlipidemia    Hypertension    PONV (postoperative nausea and vomiting)    PVC (premature ventricular contraction)    Stroke [facility])    Urinary incontinence    Past Surgical History:  Procedure Laterality Date   5 back surgeries     ABDOMINAL HYSTERECTOMY  2001   TAH/RSO   AUGMENTATION MAMMAPLASTY Bilateral 2013   saline. pre pectoral   BLADDER SUSPENSION  2003   LSO and rectocele repair at [facility]     breast augmentation--saline implants   EPIDURAL BLOCK INJECTION     HERNIA REPAIR     I & D EXTREMITY Right 06/10/2021   Procedure: IRRIGATION AND DEBRIDEMENT ACHILLES;  Surgeon: Marchia Bond, MD;  Location: [facility];  Service: Orthopedics;  Laterality: Right;   LUMBAR FUSION  2010   Dr. Carloyn Manner   REDUCTION MAMMAPLASTY Bilateral    SPINAL FUSION  2018   Dr. Ellene Route   SPINAL FUSION  04/06/2020   SPINE SURGERY      Allergies  Allergies  Allergen Reactions   Ancef [Cefazolin] Hives    Cause rash shortness of breath and vomiting    Ceftin [Cefuroxime] Nausea And Vomiting   Penicillins Hives and Other (See Comments)    Has patient had a PCN reaction causing immediate rash, facial/tongue/throat swelling, SOB or lightheadedness with hypotension: Yes Has patient had a PCN reaction causing severe rash involving mucus  membranes or skin necrosis:Yes Has patient had a PCN reaction that required hospitalization:No Has patient had a PCN reaction occurring within the last 10 years: No If all of the above answers are "NO", then may proceed with Cephalosporin use.    Gabapentin    Other Other (See Comments)    History of Present Illness    [name] is a PMH of HTN, HLD, CVA, PVCs, anemia, and palpitations.  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   She presented to the emergency department on 02/07/2021 and was discharged on 02/08/2021.  She reported she had a sudden onset of palpitations and heartburn after eating lunch.  She consumed a [place] sandwich and became tachycardic at work.  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  She did note some shortness of breath.  Her EKG at the surgery  center showed sinus tachycardia possible atrial fibrillation.  Her repeat EKG showed sinus rhythm.  She was ruled out for MI and was discharged in stable condition.  A cardiac event monitor was ordered and showed no significant arrhythmias.   She presented to the clinic 05/24/2021 for follow-up evaluation stated she continued to notice occasional episodes of palpitations that lasted for seconds and dicipated with  rest. She reported that she had not had to take her prn metoprolol tartrate. She reported compliance with her metoprolol succinate. The palpitations were most often noticed with increased physical activity. We reviewed triggers for palpitations and she expressed understanding. Her potassium was previously low.  We reviewed her blood pressure medications.  She reported that her blood pressures had been low 82X systolic.  I gave her a blood pressure log, stopped her triamterene, and continued her hydrochlorothiazide as well as her other blood pressure medications.  I repeated her BMP, had her avoid triggers, increased her  physical activity as tolerated and planned follow up in 1 month.  Her creatinine was slightly elevated.  She was asked to slightly increase her p.o. hydration.  She presents the clinic today for follow-up evaluation states***   Today she denies chest pain, shortness of breath, lower extremity edema, fatigue, palpitations, melena, hematuria, hemoptysis, diaphoresis, weakness, presyncope, syncope, orthopnea, and PND.  Home Medications    Prior to Admission medications   Medication Sig Start Date End Date Taking? Authorizing Provider  acetaminophen (TYLENOL) 325 MG tablet Take 2 tablets (650 mg total) by mouth every 6 (six) hours as needed. 06/10/21   Merlene Pulling K, PA-C  albuterol (PROVENTIL) (2.5 MG/3ML) 0.083% nebulizer solution Take 3 mLs (2.5 mg total) by nebulization 2 (two) times daily as needed for wheezing or shortness of breath. 01/18/19   Kayleen Memos, DO  ALPRAZolam Duanne Moron) 0.5 MG tablet Take 0.5 mg by mouth daily as needed for anxiety. for anxiety 06/11/18   [provider]  atorvastatin (LIPITOR) 40 MG tablet Take 40 mg by mouth daily. 04/14/21   [provider]  buPROPion (ZYBAN) 150 MG 12 hr tablet Take 150 mg by mouth daily.    [provider]  ezetimibe (ZETIA) 10 MG tablet Take 1 tablet (10 mg total) by mouth daily. 08/28/18   Frann Rider, NP  fluticasone (FLONASE) 50 MCG/ACT nasal spray Place 2 sprays into both nostrils daily as needed for rhinitis. 04/07/21   [provider]  fluticasone-salmeterol (ADVAIR) 250-50 MCG/ACT AEPB Inhale 1 puff into the lungs 2 (two) times daily as needed (shortness of breath).    [provider]  losartan (COZAAR) 100 MG tablet Take 100 mg by mouth daily.     [provider]  metoprolol succinate (TOPROL-XL) 25 MG 24 hr tablet Take 25 mg by mouth daily.    [provider]  metoprolol tartrate (LOPRESSOR) 25 MG tablet TAKE 1 TABLET (25 MG TOTAL) BY MOUTH AS NEEDED. FOR PALPITATIONS  06/20/21 07/20/21  Deberah Pelton, NP  triamterene-hydrochlorothiazide (DYAZIDE) 37.5-25 MG capsule Take 1 capsule by mouth daily.    [provider]  zolpidem (AMBIEN) 10 MG tablet Take 5 mg by mouth at bedtime as needed for sleep.    [provider]    Family History    Family History  Problem Relation Age of Onset   Hypertension Mother    Stroke Mother    Hypertension Father    Cancer Father    Dementia Father    Hyperlipidemia Sister    Hypertension Sister    Hyperlipidemia Brother    Hypertension Brother    Hypertension Brother    Hypertension Brother    Cancer Maternal Uncle 35       colon ca   Stroke Maternal Uncle    Breast cancer Maternal Grandmother    Cancer Maternal Grandmother 36       colon ca  Stroke Maternal Aunt    Stroke Paternal Grandfather    She indicated that her mother is alive. She indicated that her father is alive. She indicated that her sister is alive. She indicated that all of her three brothers are alive. She indicated that the status of her maternal grandmother is unknown. She indicated that her paternal grandfather is deceased. She indicated that her maternal aunt is deceased. She indicated that her maternal uncle is deceased.  Social History    Social History   Socioeconomic History   Marital status: Divorced    Spouse name: Not on file   Number of children: Not on file   Years of education: Not on file   Highest education level: Not on file  Occupational History   Not on file  Tobacco Use   Smoking status: Former    Pack years: 0.00    Types: Cigarettes   Smokeless tobacco: Never  Vaping Use   Vaping Use: Never used  Substance and Sexual Activity   Alcohol use: Yes    Alcohol/week: 6.0 - 8.0 standard drinks    Types: 6 - 8 Standard drinks or equivalent per week    Comment: occasionally    Drug use: Not Currently   Sexual activity: Yes    Partners: Male    Birth control/protection: Surgical    Comment: TAH   Other Topics Concern   Not on file  Social History Narrative   Not on file   Social Determinants of Health   Financial Resource Strain: Not on file  Food Insecurity: Not on file  Transportation Needs: Not on file  Physical Activity: Not on file  Stress: Not on file  Social Connections: Not on file  Intimate Partner Violence: Not on file     Review of Systems    General:  No chills, fever, night sweats or weight changes.  Cardiovascular:  No chest pain, dyspnea on exertion, edema, orthopnea, palpitations, paroxysmal nocturnal dyspnea. Dermatological: No rash, lesions/masses Respiratory: No cough, dyspnea Urologic: No hematuria, dysuria Abdominal:   No nausea, vomiting, diarrhea, bright red blood per rectum, melena, or hematemesis Neurologic:  No visual changes, wkns, changes in mental status. All other systems reviewed and are otherwise negative except as noted above.  Physical Exam    VS:  There were no vitals taken for this visit. , BMI There is no height or weight on file to calculate BMI. GEN: Well nourished, well developed, in no acute distress. HEENT: normal. Neck: Supple, no JVD, carotid bruits, or masses. Cardiac: RRR, no murmurs, rubs, or gallops. No clubbing, cyanosis, edema.  Radials/DP/PT 2+ and equal bilaterally.  Respiratory:  Respirations regular and unlabored, clear to auscultation bilaterally. GI: Soft, nontender, nondistended, BS + x 4. MS: no deformity or atrophy. Skin: warm and dry, no rash. Neuro:  Strength and sensation are intact. Psych: Normal affect.  Accessory Clinical Findings    Recent Labs: 02/07/2021: Magnesium 1.7; TSH 2.382 05/24/2021: BUN 34; Creatinine, Ser 1.33; Potassium 4.5; Sodium 142 06/10/2021: Hemoglobin 12.5; Platelets 300   Recent Lipid Panel    Component Value Date/Time   CHOL 221 (H) 06/19/2018 1217   TRIG 161 (H) 06/19/2018 1217   HDL 107 06/19/2018 1217   CHOLHDL 2.1 06/19/2018 1217   VLDL 32 06/19/2018 1217   LDLCALC  82 06/19/2018 1217    ECG personally reviewed by me today- *** - No acute changes  Cardiac event monitor 03/22/2021  Sinus bradycardia, normal sinus rhythm, sinus tachycardia. Kirk Ruths  Assessment & Plan   1.  Essential hypertension-BP today ***120/80.  Well-controlled at home.  Triamterene previously stopped Continue metoprolol, losartan,  HCTZ 25 daily, and PRN metoprolol tartrate Heart healthy low-sodium diet-salty 6 given Increase physical activity as tolerated Continue to maintain Bp log.  Tachycardia/palpitations-stable.  Cardiac event monitor showed sinus bradycardia, sinus rhythm, sinus tachycardia. Continue metoprolol and PRN metoprolol tartrate Continue to avoid triggers caffeine, chocolate, EtOH, dehydration etc. Heart healthy low-sodium diet Increase physical activity as tolerated Maintain po hydration   Hypokalemia- potassium 4.5 on 6/1/202.   Follows with PCP  Disposition: Follow-up with Dr. Stanford Breed 3-4 months.   Jossie Ng. Marieta Markov NP-C    06/27/2021, 10:09 AM [address] Office [phone] Fax [phone]  Notice: This dictation was prepared with Dragon dictation along with smaller phrase technology. Any transcriptional errors that result from this process are unintentional and may not be corrected upon review.  I spent***minutes examining this patient, reviewing medications, and using patient centered shared decision making involving her cardiac care.  Prior to her visit I spent greater than 20 minutes reviewing her past medical history,  medications, and prior cardiac tests.

## 2021-06-29 ENCOUNTER — Ambulatory Visit: Payer: BC Managed Care – PPO | Admitting: General Practice

## 2021-06-30 ENCOUNTER — Other Ambulatory Visit: Payer: Self-pay | Admitting: General Practice

## 2021-06-30 DIAGNOSIS — M25552 Pain in left hip: Secondary | ICD-10-CM | POA: Diagnosis not present

## 2021-07-04 DIAGNOSIS — R531 Weakness: Secondary | ICD-10-CM | POA: Diagnosis not present

## 2021-07-04 DIAGNOSIS — R2689 Other abnormalities of gait and mobility: Secondary | ICD-10-CM | POA: Diagnosis not present

## 2021-07-04 DIAGNOSIS — M545 Low back pain, unspecified: Secondary | ICD-10-CM | POA: Diagnosis not present

## 2021-07-05 DIAGNOSIS — S86021D Laceration of right Achilles tendon, subsequent encounter: Secondary | ICD-10-CM | POA: Diagnosis not present

## 2021-07-06 DIAGNOSIS — M25552 Pain in left hip: Secondary | ICD-10-CM | POA: Diagnosis not present

## 2021-07-06 DIAGNOSIS — M25551 Pain in right hip: Secondary | ICD-10-CM | POA: Diagnosis not present

## 2021-07-13 DIAGNOSIS — R531 Weakness: Secondary | ICD-10-CM | POA: Diagnosis not present

## 2021-07-13 DIAGNOSIS — R2689 Other abnormalities of gait and mobility: Secondary | ICD-10-CM | POA: Diagnosis not present

## 2021-07-13 DIAGNOSIS — M545 Low back pain, unspecified: Secondary | ICD-10-CM | POA: Diagnosis not present

## 2021-07-17 ENCOUNTER — Other Ambulatory Visit: Payer: Self-pay | Admitting: Family Medicine

## 2021-07-17 DIAGNOSIS — Z1152 Encounter for screening for COVID-19: Secondary | ICD-10-CM | POA: Diagnosis not present

## 2021-07-17 DIAGNOSIS — Z03818 Encounter for observation for suspected exposure to other biological agents ruled out: Secondary | ICD-10-CM | POA: Diagnosis not present

## 2021-07-17 DIAGNOSIS — U071 COVID-19: Secondary | ICD-10-CM | POA: Diagnosis not present

## 2021-07-17 DIAGNOSIS — Z20822 Contact with and (suspected) exposure to covid-19: Secondary | ICD-10-CM | POA: Diagnosis not present

## 2021-07-18 LAB — SARS CORONAVIRUS 2 (TAT 6-24 HRS): SARS Coronavirus 2: POSITIVE — AB

## 2021-07-19 ENCOUNTER — Emergency Department (HOSPITAL_COMMUNITY)
Admission: EM | Admit: 2021-07-19 | Discharge: 2021-07-19 | Disposition: A | Discharge status: Home / Self Care | Payer: BC Managed Care – PPO | Attending: Emergency Medicine | Admitting: Emergency Medicine

## 2021-07-19 ENCOUNTER — Other Ambulatory Visit: Payer: Self-pay

## 2021-07-19 ENCOUNTER — Emergency Department (HOSPITAL_COMMUNITY): Payer: BC Managed Care – PPO

## 2021-07-19 ENCOUNTER — Encounter (HOSPITAL_COMMUNITY): Payer: Self-pay

## 2021-07-19 DIAGNOSIS — Z87891 Personal history of nicotine dependence: Secondary | ICD-10-CM | POA: Insufficient documentation

## 2021-07-19 DIAGNOSIS — Z8616 Personal history of COVID-19: Secondary | ICD-10-CM | POA: Diagnosis not present

## 2021-07-19 DIAGNOSIS — R509 Fever, unspecified: Secondary | ICD-10-CM | POA: Diagnosis not present

## 2021-07-19 DIAGNOSIS — J45909 Unspecified asthma, uncomplicated: Secondary | ICD-10-CM | POA: Diagnosis not present

## 2021-07-19 DIAGNOSIS — U071 COVID-19: Secondary | ICD-10-CM | POA: Insufficient documentation

## 2021-07-19 DIAGNOSIS — I10 Essential (primary) hypertension: Secondary | ICD-10-CM | POA: Diagnosis not present

## 2021-07-19 DIAGNOSIS — R059 Cough, unspecified: Secondary | ICD-10-CM | POA: Diagnosis not present

## 2021-07-19 DIAGNOSIS — Z79899 Other long term (current) drug therapy: Secondary | ICD-10-CM | POA: Diagnosis not present

## 2021-07-19 DIAGNOSIS — R0602 Shortness of breath: Secondary | ICD-10-CM | POA: Diagnosis not present

## 2021-07-19 LAB — CBC
HCT: 38.1 % (ref 36.0–46.0)
Hemoglobin: 12.3 g/dL (ref 12.0–15.0)
MCH: 33.3 pg (ref 26.0–34.0)
MCHC: 32.3 g/dL (ref 30.0–36.0)
MCV: 103.3 fL — ABNORMAL HIGH (ref 80.0–100.0)
Platelets: 233 10*3/uL (ref 150–400)
RBC: 3.69 MIL/uL — ABNORMAL LOW (ref 3.87–5.11)
RDW: 13.2 % (ref 11.5–15.5)
WBC: 3.7 10*3/uL — ABNORMAL LOW (ref 4.0–10.5)
nRBC: 0 % (ref 0.0–0.2)

## 2021-07-19 LAB — BASIC METABOLIC PANEL
Anion gap: 14 (ref 5–15)
BUN: 17 mg/dL (ref 6–20)
CO2: 25 mmol/L (ref 22–32)
Calcium: 9.7 mg/dL (ref 8.9–10.3)
Chloride: 102 mmol/L (ref 98–111)
Creatinine, Ser: 1.05 mg/dL — ABNORMAL HIGH (ref 0.44–1.00)
GFR, Estimated: >60 mL/min (ref >60)
Glucose, Bld: 98 mg/dL (ref 70–99)
Potassium: 3.1 mmol/L — ABNORMAL LOW (ref 3.5–5.1)
Sodium: 141 mmol/L (ref 135–145)

## 2021-07-19 MED ORDER — BENZONATATE 100 MG PO CAPS
100.0000 mg | ORAL_CAPSULE | Freq: Once | ORAL | Status: AC
  Administered 2021-07-19: 100 mg via ORAL
  Filled 2021-07-19: qty 1

## 2021-07-19 MED ORDER — BENZONATATE 100 MG PO CAPS: 100.0000 mg | ORAL_CAPSULE | Freq: Three times a day (TID) | ORAL | 0 refills | Status: DC

## 2021-07-19 MED ORDER — ALBUTEROL SULFATE HFA 108 (90 BASE) MCG/ACT IN AERS
2.0000 | INHALATION_SPRAY | RESPIRATORY_TRACT | Status: DC | PRN
  Administered 2021-07-19: 2 via RESPIRATORY_TRACT
  Filled 2021-07-19: qty 6.7

## 2021-07-19 MED ORDER — POTASSIUM CHLORIDE CRYS ER 20 MEQ PO TBCR
40.0000 meq | EXTENDED_RELEASE_TABLET | Freq: Once | ORAL | Status: AC
  Administered 2021-07-19: 40 meq via ORAL
  Filled 2021-07-19: qty 2

## 2021-07-19 NOTE — ED Notes (Signed)
ED provider at the bedside.  

## 2021-07-19 NOTE — ED Notes (Signed)
Xray at the bedside.

## 2021-07-19 NOTE — ED Notes (Signed)
PA-C Domenic Moras at the bedside to evaluate.

## 2021-07-19 NOTE — Discharge Instructions (Addendum)
You have symptoms related to current COVID infection.  Continue to take Paxlovid as previously prescribed.  Take Tessalon as needed for cough.  Follow instruction below  Recommendations for at home COVID-19 symptoms management:  Please continue isolation at home. Call [phone] to see whether you might be eligible for therapeutic antibody infusions (leave your name and they will call you back).  If have acute worsening of symptoms please go to ER/urgent care for further evaluation. Check pulse oximetry and if below 90-92% please go to ER. The following supplements MAY help:  Vitamin C '500mg'$  twice a day and Quercetin 250-500 mg twice a day Vitamin D3 2000 - 4000 u/day B Complex vitamins Zinc 75-100 mg/day Melatonin 6-10 mg at night (the optimal dose is unknown) Aspirin '81mg'$ /day (if no history of bleeding issues)

## 2021-07-19 NOTE — ED Triage Notes (Signed)
Pt presents to the ED for a several day hx of fever, cough, shortness of breath, body aches, and fatigue. She states she has taken multiple home-tests and reports they were negative, however, she states she was seen by her PCP earlier this week and tested positive for Covid via PCR. Pt has been taking Tylenol at home for her fever. She reports her highest fever was 102F. 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

## 2021-07-19 NOTE — ED Provider Notes (Signed)
[facility] DEPT Provider Note   CSN: [id number] Arrival date & time: 07/19/21  1026     History Chief Complaint  Patient presents with   Shortness of Breath    [name] is a 60 y.o. female.  The history is provided by the patient and medical records. No language interpreter was used.   60 year old female history of recurrent COVID infection, asthma, anemia, who presents complaining of cough and shortness of breath.  For the past 5 days patient has had fever, chills, body aches, cough productive of green sputum, increased shortness of breath and generalized weakness.  She has had multiple negative home COVID test but due to the persistent of the symptoms, she did have a PCR test 2 days ago and it came back positive.  She recently was started on Paxil but has had a total of 3 doses but states that she feels no improvement and her cough has been persistent which concerns her.  She has been fully vaccinated for COVID-19.  She works as a Marine scientist.  No prior history of PE or DVT.  She has been using her nebulizer at home  Past Medical History:  Diagnosis Date   Adenomatous polyps    Anemia    Anxiety    Asthma    Blood transfusion without reported diagnosis 01/1999   following hysterectomy   Bulging discs    Endometriosis    History of atrial fibrillation    History of COVID-19    12/2018, 10/2020   Hx of respiratory failure    Hyperlipidemia    Hypertension    PONV (postoperative nausea and vomiting)    PVC (premature ventricular contraction)    Stroke [facility])    Urinary incontinence     Patient Active Problem List   Diagnosis Date Noted   Palpitations 02/07/2021   Atrial tachycardia ([place]) 02/07/2021   Altered mental status, unspecified 04/10/2020   Acute respiratory failure with hypoxia ([place]) 01/16/2019   Acute respiratory failure with hypoxemia ([place]) 01/15/2019   Hyperglycemia 01/15/2019   Pulmonary nodule 01/15/2019   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 pre pectoral   BLADDER SUSPENSION  2003   LSO and rectocele repair at [facility]     breast augmentation--saline implants   EPIDURAL BLOCK INJECTION     HERNIA REPAIR     I & D EXTREMITY Right 06/10/2021   Procedure: IRRIGATION AND DEBRIDEMENT ACHILLES;  Surgeon: Marchia Bond, MD;  Location: [facility];  Service: Orthopedics;  Laterality: Right;   LUMBAR FUSION  2010   Dr. Carloyn Manner   REDUCTION MAMMAPLASTY Bilateral    SPINAL FUSION  2018   Dr. Ellene Route   SPINAL FUSION  04/06/2020   SPINE SURGERY       OB History     Gravida  5   Para  3   Term  3   Preterm      AB  2   Living  3      SAB  2  IAB      Ectopic      Multiple      Live Births              Family History  Problem Relation Age of Onset   Hypertension Mother    Stroke Mother    Hypertension Father    Cancer Father    Dementia Father    Hyperlipidemia Sister    Hypertension Sister    Hyperlipidemia Brother    Hypertension Brother    Hypertension Brother    Hypertension Brother    Cancer Maternal Uncle 55       colon ca   Stroke Maternal Uncle    Breast cancer Maternal Grandmother    Cancer Maternal Grandmother 87       colon ca   Stroke Maternal Aunt    Stroke Paternal Grandfather     Social History   Tobacco Use   Smoking status: Former    Types: Cigarettes   Smokeless tobacco: Never  Vaping Use   Vaping Use: Never used  Substance Use Topics   Alcohol use: Yes     Alcohol/week: 6.0 - 8.0 standard drinks    Types: 6 - 8 Standard drinks or equivalent per week    Comment: occasionally    Drug use: Not Currently    Home Medications Prior to Admission medications   Medication Sig Start Date End Date Taking? Authorizing Provider  acetaminophen (TYLENOL) 325 MG tablet Take 2 tablets (650 mg total) by mouth every 6 (six) hours as needed. 06/10/21   Merlene Pulling K, PA-C  albuterol (PROVENTIL) (2.5 MG/3ML) 0.083% nebulizer solution Take 3 mLs (2.5 mg total) by nebulization 2 (two) times daily as needed for wheezing or shortness of breath. 01/18/19   Kayleen Memos, DO  ALPRAZolam Duanne Moron) 0.5 MG tablet Take 0.5 mg by mouth daily as needed for anxiety. for anxiety 06/11/18   [provider]  atorvastatin (LIPITOR) 40 MG tablet Take 40 mg by mouth daily. 04/14/21   [provider]  buPROPion (ZYBAN) 150 MG 12 hr tablet Take 150 mg by mouth daily.    [provider]  ezetimibe (ZETIA) 10 MG tablet Take 1 tablet (10 mg total) by mouth daily. 08/28/18   Frann Rider, NP  fluticasone (FLONASE) 50 MCG/ACT nasal spray Place 2 sprays into both nostrils daily as needed for rhinitis. 04/07/21   [provider]  fluticasone-salmeterol (ADVAIR) 250-50 MCG/ACT AEPB Inhale 1 puff into the lungs 2 (two) times daily as needed (shortness of breath).    [provider]  losartan (COZAAR) 100 MG tablet Take 100 mg by mouth daily.     [provider]  metoprolol succinate (TOPROL-XL) 25 MG 24 hr tablet Take 25 mg by mouth daily.    [provider]  metoprolol tartrate (LOPRESSOR) 25 MG tablet TAKE 1 TABLET BY MOUTH AS NEEDED FOR PALPITATIONS 07/03/21   Lelon Perla, MD  triamterene-hydrochlorothiazide (DYAZIDE) 37.5-25 MG capsule Take 1 capsule by mouth daily.    [provider]  zolpidem (AMBIEN) 10 MG tablet Take 5 mg by mouth at bedtime as needed for sleep.    [provider]    Allergies    Ancef  [cefazolin], Ceftin [cefuroxime], Penicillins, Gabapentin, and Other  Review of Systems   Review of Systems  All other systems reviewed and are negative.  Physical Exam Updated Vital Signs BP (!) 143/91   Pulse 77   Temp 98.8 F (37.1 C) (Oral)  Resp 20   Ht '5\' 4"'$  (1.626 m)   Wt 62.6 kg   SpO2 97%   BMI 23.69 kg/m   Physical Exam Vitals and nursing note reviewed.  Constitutional:      General: She is not in acute distress.    Appearance: She is well-developed.     Comments: Patient overall well-appearing however does have persistent cough in the room  HENT:     Head: Atraumatic.  Eyes:     Conjunctiva/sclera: Conjunctivae normal.  Cardiovascular:     Rate and Rhythm: Normal rate and regular rhythm.     Pulses: Normal pulses.     Heart sounds: Normal heart sounds.  Pulmonary:     Effort: Pulmonary effort is normal.     Breath sounds: Rhonchi present.  Abdominal:     Palpations: Abdomen is soft.     Tenderness: There is no abdominal tenderness.  Musculoskeletal:     Cervical back: Neck supple.     Right lower leg: No edema.     Left lower leg: No edema.  Skin:    Findings: No rash.  Neurological:     Mental Status: She is alert. Mental status is at baseline.  Psychiatric:        Mood and Affect: Mood normal.    ED Results / Procedures / Treatments   Labs (all labs ordered are listed, but only abnormal results are displayed) Labs Reviewed - No data to display  EKG None  Date: 07/19/2021  Rate: 91  Rhythm: normal sinus rhythm  QRS Axis: normal  Intervals: normal  ST/T Wave abnormalities: normal  Conduction Disutrbances: none  Narrative Interpretation:   Old EKG Reviewed: No significant changes noted    Radiology DG Chest Port 1 View  Result Date: 07/19/2021 CLINICAL DATA:  60 year old female positive COVID-19. Shortness of breath, fever, cough. EXAM: PORTABLE CHEST 1 VIEW COMPARISON:  Chest radiographs 02/07/2021 and earlier. FINDINGS: Portable AP  semi upright view at 1104 hours. Lung volumes and mediastinal contours remain normal. Visualized tracheal air column is within normal limits. Allowing for portable technique the lungs are clear. Partially visible lumbar spine fusion hardware. No acute osseous abnormality identified. Negative visible bowel gas. IMPRESSION: Negative portable chest. Electronically Signed   By: Genevie Ann M.D.   On: 07/19/2021 11:42    Procedures Procedures   Medications Ordered in ED Medications  albuterol (VENTOLIN HFA) 108 (90 Base) MCG/ACT inhaler 2 puff (2 puffs Inhalation Given 07/19/21 1056)  potassium chloride SA (KLOR-CON) CR tablet 40 mEq (has no administration in time range)  benzonatate (TESSALON) capsule 100 mg (100 mg Oral Given 07/19/21 1056)    ED Course  I have reviewed the triage vital signs and the nursing notes.  Pertinent labs & imaging results that were available during my care of the patient were reviewed by me and considered in my medical decision making (see chart for details).    MDM Rules/Calculators/A&P                           BP (!) 143/91   Pulse 77   Temp 98.8 F (37.1 C) (Oral)   Resp 20   Ht '5\' 4"'$  (1.626 m)   Wt 62.6 kg   SpO2 97%   BMI 23.69 kg/m   Final Clinical Impression(s) / ED Diagnoses Final diagnoses:  COVID-19 virus infection    Rx / DC Orders ED Discharge Orders  Ordered    benzonatate (TESSALON) 100 MG capsule  Every 8 hours        07/19/21 1244           10:57 AM Patient test positive for COVID infection through PCR test recently.  This is day 5 of symptoms and she has been on Paxil over the treatment for the past 2 days.  She is here with concerns of progressive worsening of her symptoms and shortness of breath.  Fortunately she is not hypoxic on room air.  She is afebrile at this time.  Will obtain chest x-ray check basic labs and provide symptomatic treatment.  At this time no indication for admission.  Low suspicion for PE as a  source.  12:41 PM Labs remarkable for mild hypokalemia with potassium of 3.1, will give supplementation.  Chest x-ray unremarkable.  Patient is not hypoxic.  Symptoms likely secondary to COVID.  She is also on Z-Pak.  Plan to discharge home with cough medication and patient given strict return precaution.  YAILENE BASSI was evaluated in Emergency Department on 07/19/2021 for the symptoms described in the history of present illness. She was evaluated in the context of the global COVID-19 pandemic, which necessitated consideration that the patient might be at risk for infection with the SARS-CoV-2 virus that causes COVID-19. Institutional protocols and algorithms that pertain to the evaluation of patients at risk for COVID-19 are in a state of rapid change based on information released by regulatory bodies including the CDC and federal and state organizations. These policies and algorithms were followed during the patient's care in the ED.    Domenic Moras, PA-C 07/19/21 1245    Davonna Belling, MD 07/20/21 1038

## 2021-08-09 ENCOUNTER — Ambulatory Visit: Payer: BC Managed Care – PPO | Admitting: General Practice

## 2021-08-10 DIAGNOSIS — R531 Weakness: Secondary | ICD-10-CM | POA: Diagnosis not present

## 2021-08-10 DIAGNOSIS — R2689 Other abnormalities of gait and mobility: Secondary | ICD-10-CM | POA: Diagnosis not present

## 2021-08-10 DIAGNOSIS — M545 Low back pain, unspecified: Secondary | ICD-10-CM | POA: Diagnosis not present

## 2021-08-14 DIAGNOSIS — R2689 Other abnormalities of gait and mobility: Secondary | ICD-10-CM | POA: Diagnosis not present

## 2021-08-14 DIAGNOSIS — R531 Weakness: Secondary | ICD-10-CM | POA: Diagnosis not present

## 2021-08-14 DIAGNOSIS — M545 Low back pain, unspecified: Secondary | ICD-10-CM | POA: Diagnosis not present

## 2021-08-24 DIAGNOSIS — Z23 Encounter for immunization: Secondary | ICD-10-CM | POA: Diagnosis not present

## 2021-08-24 DIAGNOSIS — M7061 Trochanteric bursitis, right hip: Secondary | ICD-10-CM | POA: Diagnosis not present

## 2021-08-24 DIAGNOSIS — I10 Essential (primary) hypertension: Secondary | ICD-10-CM | POA: Diagnosis not present

## 2021-08-24 DIAGNOSIS — J4520 Mild intermittent asthma, uncomplicated: Secondary | ICD-10-CM | POA: Diagnosis not present

## 2021-08-24 DIAGNOSIS — M7062 Trochanteric bursitis, left hip: Secondary | ICD-10-CM | POA: Diagnosis not present

## 2021-08-24 DIAGNOSIS — E782 Mixed hyperlipidemia: Secondary | ICD-10-CM | POA: Diagnosis not present

## 2021-08-24 DIAGNOSIS — I493 Ventricular premature depolarization: Secondary | ICD-10-CM | POA: Diagnosis not present

## 2021-08-24 DIAGNOSIS — Z0000 Encounter for general adult medical examination without abnormal findings: Secondary | ICD-10-CM | POA: Diagnosis not present

## 2021-09-07 DIAGNOSIS — R531 Weakness: Secondary | ICD-10-CM | POA: Diagnosis not present

## 2021-09-07 DIAGNOSIS — M545 Low back pain, unspecified: Secondary | ICD-10-CM | POA: Diagnosis not present

## 2021-09-07 DIAGNOSIS — R2689 Other abnormalities of gait and mobility: Secondary | ICD-10-CM | POA: Diagnosis not present

## 2021-09-12 DIAGNOSIS — R2689 Other abnormalities of gait and mobility: Secondary | ICD-10-CM | POA: Diagnosis not present

## 2021-09-12 DIAGNOSIS — M545 Low back pain, unspecified: Secondary | ICD-10-CM | POA: Diagnosis not present

## 2021-09-12 DIAGNOSIS — R531 Weakness: Secondary | ICD-10-CM | POA: Diagnosis not present

## 2021-09-15 ENCOUNTER — Other Ambulatory Visit: Payer: Self-pay | Admitting: Family Medicine

## 2021-09-15 DIAGNOSIS — U071 COVID-19: Secondary | ICD-10-CM | POA: Diagnosis not present

## 2021-09-15 DIAGNOSIS — Z20822 Contact with and (suspected) exposure to covid-19: Secondary | ICD-10-CM | POA: Diagnosis not present

## 2021-09-15 DIAGNOSIS — Z1152 Encounter for screening for COVID-19: Secondary | ICD-10-CM | POA: Diagnosis not present

## 2021-09-15 DIAGNOSIS — Z03818 Encounter for observation for suspected exposure to other biological agents ruled out: Secondary | ICD-10-CM | POA: Diagnosis not present

## 2021-09-18 LAB — SARS CORONAVIRUS 2 (TAT 6-24 HRS): SARS Coronavirus 2: NEGATIVE

## 2021-09-21 DIAGNOSIS — R531 Weakness: Secondary | ICD-10-CM | POA: Diagnosis not present

## 2021-09-21 DIAGNOSIS — M545 Low back pain, unspecified: Secondary | ICD-10-CM | POA: Diagnosis not present

## 2021-09-21 DIAGNOSIS — R2689 Other abnormalities of gait and mobility: Secondary | ICD-10-CM | POA: Diagnosis not present

## 2021-09-26 DIAGNOSIS — R2689 Other abnormalities of gait and mobility: Secondary | ICD-10-CM | POA: Diagnosis not present

## 2021-09-26 DIAGNOSIS — R531 Weakness: Secondary | ICD-10-CM | POA: Diagnosis not present

## 2021-09-26 DIAGNOSIS — M545 Low back pain, unspecified: Secondary | ICD-10-CM | POA: Diagnosis not present

## 2021-10-06 DIAGNOSIS — R2689 Other abnormalities of gait and mobility: Secondary | ICD-10-CM | POA: Diagnosis not present

## 2021-10-06 DIAGNOSIS — M545 Low back pain, unspecified: Secondary | ICD-10-CM | POA: Diagnosis not present

## 2021-10-06 DIAGNOSIS — R531 Weakness: Secondary | ICD-10-CM | POA: Diagnosis not present

## 2021-10-10 DIAGNOSIS — R531 Weakness: Secondary | ICD-10-CM | POA: Diagnosis not present

## 2021-10-10 DIAGNOSIS — M545 Low back pain, unspecified: Secondary | ICD-10-CM | POA: Diagnosis not present

## 2021-10-10 DIAGNOSIS — R2689 Other abnormalities of gait and mobility: Secondary | ICD-10-CM | POA: Diagnosis not present

## 2021-10-17 DIAGNOSIS — M1612 Unilateral primary osteoarthritis, left hip: Secondary | ICD-10-CM | POA: Diagnosis not present

## 2021-10-18 ENCOUNTER — Telehealth: Payer: Self-pay | Admitting: Cardiology

## 2021-10-18 DIAGNOSIS — R531 Weakness: Secondary | ICD-10-CM | POA: Diagnosis not present

## 2021-10-18 DIAGNOSIS — M545 Low back pain, unspecified: Secondary | ICD-10-CM | POA: Diagnosis not present

## 2021-10-18 DIAGNOSIS — R2689 Other abnormalities of gait and mobility: Secondary | ICD-10-CM | POA: Diagnosis not present

## 2021-10-18 NOTE — Telephone Encounter (Signed)
   [facility] Pre-operative Risk Assessment    [name]  DOB: [birth year] MRN: [id number]  HEARTCARE STAFF:  - IMPORTANT!!!!!! Under Visit Info/Reason for Call, type in Other and utilize the format Clearance MM/DD/YY or Clearance TBD. Do not use dashes or single digits. - Please review there is not already an duplicate clearance open for this procedure. - If request is for dental extraction, please clarify the # of teeth to be extracted. - If the patient is currently at the dentist's office, call Pre-Op Callback Staff (MA/nurse) to input urgent request.  - If the patient is not currently in the dentist office, please route to the Pre-Op pool.  Request for surgical clearance:  What type of surgery is being performed? L Hip Replacement  When is this surgery scheduled? 11/03/21  What type of clearance is required (medical clearance vs. Pharmacy clearance to hold med vs. Both)? Both  Are there any medications that need to be held prior to surgery and how long? TBD by Cardiology  Practice name and name of physician performing surgery? Dr. Frederik Pear, [facility]  What is the office phone number? [phone]    7.   What is the office fax number? [phone]  8.   Anesthesia type (None, local, MAC, general) ? Spinal    Johnna Acosta 10/18/2021, 8:53 AM  _________________________________________________________________   (provider comments below)

## 2021-10-18 NOTE — Telephone Encounter (Addendum)
   Name: [name]  DOB: [birth year]  MRN: [id number]   Primary Cardiologist: Kirk Ruths, MD  Chart reviewed as part of pre-operative protocol coverage. Patient was contacted 10/18/2021 in reference to pre-operative risk assessment for pending surgery as outlined below.  [name] was last seen on 05/2021 by Coletta Memos, NP. She had a prior history of palpitations in 01/2021 felt to represent sinus tach with PACs and possibly small runs of atrial tachycardia, no overt atrial fibrillation per notes. F/u monitor 02/2021 was benign without significant arrhythmias. EKG 06/2021 NSR. Prior echo 2020 with normal EF. RCRI 0.9% indicating low CV risk. Reached out to patient to confirm no symptoms. Got VMx2. Mailbox full. Will send MyChart message to call us back.   Addendum: Patient returned call. The patient affirms she has been doing great without any new cardiac symptoms. She states she had cancelled her follow-up with our office because she's since been following with her PCP instead and had adjustment in her BP medications and has been doing "phenomenal." No recent issues of CP, SOB, palpitations, syncope reported. Therefore, based on ACC/AHA guidelines, the patient would be at acceptable risk for the planned procedure without further cardiovascular testing. The patient was advised that if she develops new symptoms prior to surgery to contact our office to arrange for a follow-up visit, and she verbalized understanding.  Of note, she is not on any antiplatelets/anticoagulants per our records that would require holding. Would follow rhythm on telemetry monitoring during surgery - please notify cardiology if we can be of any assistance.  Will route this bundled recommendation to requesting provider via Epic fax function. Please call with questions.  Charlie Pitter, PA-C 10/18/2021, 9:36 AM

## 2021-10-19 DIAGNOSIS — Z03818 Encounter for observation for suspected exposure to other biological agents ruled out: Secondary | ICD-10-CM | POA: Diagnosis not present

## 2021-10-19 DIAGNOSIS — E782 Mixed hyperlipidemia: Secondary | ICD-10-CM | POA: Diagnosis not present

## 2021-10-24 DIAGNOSIS — R531 Weakness: Secondary | ICD-10-CM | POA: Diagnosis not present

## 2021-10-24 DIAGNOSIS — M1612 Unilateral primary osteoarthritis, left hip: Secondary | ICD-10-CM | POA: Diagnosis not present

## 2021-10-24 DIAGNOSIS — M25652 Stiffness of left hip, not elsewhere classified: Secondary | ICD-10-CM | POA: Diagnosis not present

## 2021-10-24 DIAGNOSIS — M25552 Pain in left hip: Secondary | ICD-10-CM | POA: Diagnosis not present

## 2021-10-24 HISTORY — PX: REPLACEMENT TOTAL HIP W/  RESURFACING IMPLANTS: SUR1222

## 2021-11-01 DIAGNOSIS — M25552 Pain in left hip: Secondary | ICD-10-CM | POA: Diagnosis not present

## 2021-11-03 DIAGNOSIS — M1612 Unilateral primary osteoarthritis, left hip: Secondary | ICD-10-CM | POA: Diagnosis not present

## 2021-11-07 DIAGNOSIS — M25552 Pain in left hip: Secondary | ICD-10-CM | POA: Diagnosis not present

## 2021-11-30 DIAGNOSIS — F4320 Adjustment disorder, unspecified: Secondary | ICD-10-CM | POA: Diagnosis not present

## 2021-12-01 DIAGNOSIS — L570 Actinic keratosis: Secondary | ICD-10-CM | POA: Diagnosis not present

## 2021-12-01 DIAGNOSIS — L820 Inflamed seborrheic keratosis: Secondary | ICD-10-CM | POA: Diagnosis not present

## 2021-12-01 DIAGNOSIS — L538 Other specified erythematous conditions: Secondary | ICD-10-CM | POA: Diagnosis not present

## 2021-12-01 DIAGNOSIS — R208 Other disturbances of skin sensation: Secondary | ICD-10-CM | POA: Diagnosis not present

## 2021-12-04 DIAGNOSIS — F4320 Adjustment disorder, unspecified: Secondary | ICD-10-CM | POA: Diagnosis not present

## 2021-12-05 ENCOUNTER — Other Ambulatory Visit (HOSPITAL_COMMUNITY): Payer: Self-pay | Admitting: Family Medicine

## 2021-12-07 DIAGNOSIS — Z9889 Other specified postprocedural states: Secondary | ICD-10-CM | POA: Diagnosis not present

## 2021-12-07 DIAGNOSIS — F4320 Adjustment disorder, unspecified: Secondary | ICD-10-CM | POA: Diagnosis not present

## 2021-12-07 DIAGNOSIS — M25652 Stiffness of left hip, not elsewhere classified: Secondary | ICD-10-CM | POA: Diagnosis not present

## 2021-12-13 ENCOUNTER — Encounter (HOSPITAL_COMMUNITY): Payer: Self-pay

## 2021-12-13 ENCOUNTER — Ambulatory Visit (HOSPITAL_COMMUNITY): Payer: BC Managed Care – PPO

## 2021-12-21 DIAGNOSIS — F4320 Adjustment disorder, unspecified: Secondary | ICD-10-CM | POA: Diagnosis not present

## 2021-12-27 DIAGNOSIS — F4320 Adjustment disorder, unspecified: Secondary | ICD-10-CM | POA: Diagnosis not present

## 2021-12-28 ENCOUNTER — Ambulatory Visit (HOSPITAL_COMMUNITY)
Admission: RE | Admit: 2021-12-28 | Discharge: 2021-12-28 | Disposition: A | Discharge status: Home / Self Care | Payer: Self-pay | Source: Ambulatory Visit | Attending: Family Medicine | Admitting: Family Medicine

## 2021-12-28 ENCOUNTER — Other Ambulatory Visit: Payer: Self-pay

## 2021-12-28 DIAGNOSIS — Z136 Encounter for screening for cardiovascular disorders: Secondary | ICD-10-CM | POA: Insufficient documentation

## 2021-12-29 DIAGNOSIS — D1801 Hemangioma of skin and subcutaneous tissue: Secondary | ICD-10-CM | POA: Diagnosis not present

## 2021-12-29 DIAGNOSIS — D485 Neoplasm of uncertain behavior of skin: Secondary | ICD-10-CM | POA: Diagnosis not present

## 2021-12-29 DIAGNOSIS — L570 Actinic keratosis: Secondary | ICD-10-CM | POA: Diagnosis not present

## 2021-12-29 DIAGNOSIS — D225 Melanocytic nevi of trunk: Secondary | ICD-10-CM | POA: Diagnosis not present

## 2021-12-29 DIAGNOSIS — L814 Other melanin hyperpigmentation: Secondary | ICD-10-CM | POA: Diagnosis not present

## 2021-12-29 DIAGNOSIS — L448 Other specified papulosquamous disorders: Secondary | ICD-10-CM | POA: Diagnosis not present

## 2021-12-29 DIAGNOSIS — L821 Other seborrheic keratosis: Secondary | ICD-10-CM | POA: Diagnosis not present

## 2022-01-01 DIAGNOSIS — F5101 Primary insomnia: Secondary | ICD-10-CM | POA: Diagnosis not present

## 2022-01-01 DIAGNOSIS — E782 Mixed hyperlipidemia: Secondary | ICD-10-CM | POA: Diagnosis not present

## 2022-01-01 DIAGNOSIS — F418 Other specified anxiety disorders: Secondary | ICD-10-CM | POA: Diagnosis not present

## 2022-01-01 DIAGNOSIS — F4320 Adjustment disorder, unspecified: Secondary | ICD-10-CM | POA: Diagnosis not present

## 2022-01-01 DIAGNOSIS — F3289 Other specified depressive episodes: Secondary | ICD-10-CM | POA: Diagnosis not present

## 2022-01-11 DIAGNOSIS — F4320 Adjustment disorder, unspecified: Secondary | ICD-10-CM | POA: Diagnosis not present

## 2022-01-16 DIAGNOSIS — F4320 Adjustment disorder, unspecified: Secondary | ICD-10-CM | POA: Diagnosis not present

## 2022-02-17 DIAGNOSIS — M25552 Pain in left hip: Secondary | ICD-10-CM | POA: Diagnosis not present

## 2022-02-27 DIAGNOSIS — M7062 Trochanteric bursitis, left hip: Secondary | ICD-10-CM | POA: Diagnosis not present

## 2022-02-27 DIAGNOSIS — Z9889 Other specified postprocedural states: Secondary | ICD-10-CM | POA: Diagnosis not present

## 2022-03-08 DIAGNOSIS — L570 Actinic keratosis: Secondary | ICD-10-CM | POA: Diagnosis not present

## 2022-03-08 DIAGNOSIS — M7062 Trochanteric bursitis, left hip: Secondary | ICD-10-CM | POA: Diagnosis not present

## 2022-03-08 DIAGNOSIS — E782 Mixed hyperlipidemia: Secondary | ICD-10-CM | POA: Diagnosis not present

## 2022-03-08 DIAGNOSIS — L720 Epidermal cyst: Secondary | ICD-10-CM | POA: Diagnosis not present

## 2022-03-21 DIAGNOSIS — M79605 Pain in left leg: Secondary | ICD-10-CM | POA: Diagnosis not present

## 2022-03-21 DIAGNOSIS — R269 Unspecified abnormalities of gait and mobility: Secondary | ICD-10-CM | POA: Diagnosis not present

## 2022-03-27 DIAGNOSIS — R269 Unspecified abnormalities of gait and mobility: Secondary | ICD-10-CM | POA: Diagnosis not present

## 2022-03-27 DIAGNOSIS — E782 Mixed hyperlipidemia: Secondary | ICD-10-CM | POA: Diagnosis not present

## 2022-03-27 DIAGNOSIS — R7401 Elevation of levels of liver transaminase levels: Secondary | ICD-10-CM | POA: Diagnosis not present

## 2022-03-27 DIAGNOSIS — F418 Other specified anxiety disorders: Secondary | ICD-10-CM | POA: Diagnosis not present

## 2022-03-27 DIAGNOSIS — M79605 Pain in left leg: Secondary | ICD-10-CM | POA: Diagnosis not present

## 2022-03-27 DIAGNOSIS — I10 Essential (primary) hypertension: Secondary | ICD-10-CM | POA: Diagnosis not present

## 2022-03-28 ENCOUNTER — Other Ambulatory Visit: Payer: Self-pay | Admitting: Obstetrics and Gynecology

## 2022-03-28 ENCOUNTER — Ambulatory Visit: Payer: BC Managed Care – PPO

## 2022-03-28 DIAGNOSIS — Z1231 Encounter for screening mammogram for malignant neoplasm of breast: Secondary | ICD-10-CM

## 2022-03-30 ENCOUNTER — Ambulatory Visit
Admission: RE | Admit: 2022-03-30 | Discharge: 2022-03-30 | Disposition: A | Discharge status: Home / Self Care | Payer: BC Managed Care – PPO | Source: Ambulatory Visit | Attending: Obstetrics and Gynecology | Admitting: Obstetrics and Gynecology

## 2022-03-30 DIAGNOSIS — Z1231 Encounter for screening mammogram for malignant neoplasm of breast: Secondary | ICD-10-CM

## 2022-03-31 DIAGNOSIS — R269 Unspecified abnormalities of gait and mobility: Secondary | ICD-10-CM | POA: Diagnosis not present

## 2022-03-31 DIAGNOSIS — M79605 Pain in left leg: Secondary | ICD-10-CM | POA: Diagnosis not present

## 2022-04-11 DIAGNOSIS — R269 Unspecified abnormalities of gait and mobility: Secondary | ICD-10-CM | POA: Diagnosis not present

## 2022-04-11 DIAGNOSIS — M79605 Pain in left leg: Secondary | ICD-10-CM | POA: Diagnosis not present

## 2022-04-20 DIAGNOSIS — R269 Unspecified abnormalities of gait and mobility: Secondary | ICD-10-CM | POA: Diagnosis not present

## 2022-04-20 DIAGNOSIS — M79605 Pain in left leg: Secondary | ICD-10-CM | POA: Diagnosis not present

## 2022-04-28 DIAGNOSIS — R269 Unspecified abnormalities of gait and mobility: Secondary | ICD-10-CM | POA: Diagnosis not present

## 2022-04-28 DIAGNOSIS — M79605 Pain in left leg: Secondary | ICD-10-CM | POA: Diagnosis not present

## 2022-05-03 DIAGNOSIS — M79605 Pain in left leg: Secondary | ICD-10-CM | POA: Diagnosis not present

## 2022-05-03 DIAGNOSIS — R269 Unspecified abnormalities of gait and mobility: Secondary | ICD-10-CM | POA: Diagnosis not present

## 2022-05-08 DIAGNOSIS — M79605 Pain in left leg: Secondary | ICD-10-CM | POA: Diagnosis not present

## 2022-05-08 DIAGNOSIS — R269 Unspecified abnormalities of gait and mobility: Secondary | ICD-10-CM | POA: Diagnosis not present

## 2022-05-15 DIAGNOSIS — M7062 Trochanteric bursitis, left hip: Secondary | ICD-10-CM | POA: Diagnosis not present

## 2022-06-05 DIAGNOSIS — M79605 Pain in left leg: Secondary | ICD-10-CM | POA: Diagnosis not present

## 2022-06-05 DIAGNOSIS — R269 Unspecified abnormalities of gait and mobility: Secondary | ICD-10-CM | POA: Diagnosis not present

## 2022-06-13 DIAGNOSIS — M79605 Pain in left leg: Secondary | ICD-10-CM | POA: Diagnosis not present

## 2022-06-13 DIAGNOSIS — R269 Unspecified abnormalities of gait and mobility: Secondary | ICD-10-CM | POA: Diagnosis not present

## 2022-06-28 DIAGNOSIS — R269 Unspecified abnormalities of gait and mobility: Secondary | ICD-10-CM | POA: Diagnosis not present

## 2022-06-28 DIAGNOSIS — M79605 Pain in left leg: Secondary | ICD-10-CM | POA: Diagnosis not present

## 2022-07-03 DIAGNOSIS — M79605 Pain in left leg: Secondary | ICD-10-CM | POA: Diagnosis not present

## 2022-07-03 DIAGNOSIS — R269 Unspecified abnormalities of gait and mobility: Secondary | ICD-10-CM | POA: Diagnosis not present

## 2022-07-04 DIAGNOSIS — E782 Mixed hyperlipidemia: Secondary | ICD-10-CM | POA: Diagnosis not present

## 2022-07-12 DIAGNOSIS — M79605 Pain in left leg: Secondary | ICD-10-CM | POA: Diagnosis not present

## 2022-07-12 DIAGNOSIS — R269 Unspecified abnormalities of gait and mobility: Secondary | ICD-10-CM | POA: Diagnosis not present

## 2022-07-17 DIAGNOSIS — F3289 Other specified depressive episodes: Secondary | ICD-10-CM | POA: Diagnosis not present

## 2022-07-17 DIAGNOSIS — I10 Essential (primary) hypertension: Secondary | ICD-10-CM | POA: Diagnosis not present

## 2022-07-17 DIAGNOSIS — R7401 Elevation of levels of liver transaminase levels: Secondary | ICD-10-CM | POA: Diagnosis not present

## 2022-07-17 DIAGNOSIS — E782 Mixed hyperlipidemia: Secondary | ICD-10-CM | POA: Diagnosis not present

## 2022-07-18 DIAGNOSIS — M79605 Pain in left leg: Secondary | ICD-10-CM | POA: Diagnosis not present

## 2022-07-18 DIAGNOSIS — R269 Unspecified abnormalities of gait and mobility: Secondary | ICD-10-CM | POA: Diagnosis not present

## 2022-08-01 DIAGNOSIS — R269 Unspecified abnormalities of gait and mobility: Secondary | ICD-10-CM | POA: Diagnosis not present

## 2022-08-01 DIAGNOSIS — M79605 Pain in left leg: Secondary | ICD-10-CM | POA: Diagnosis not present

## 2022-08-08 DIAGNOSIS — M79605 Pain in left leg: Secondary | ICD-10-CM | POA: Diagnosis not present

## 2022-08-08 DIAGNOSIS — R269 Unspecified abnormalities of gait and mobility: Secondary | ICD-10-CM | POA: Diagnosis not present

## 2022-08-14 DIAGNOSIS — R269 Unspecified abnormalities of gait and mobility: Secondary | ICD-10-CM | POA: Diagnosis not present

## 2022-08-14 DIAGNOSIS — M79605 Pain in left leg: Secondary | ICD-10-CM | POA: Diagnosis not present

## 2022-08-21 DIAGNOSIS — R269 Unspecified abnormalities of gait and mobility: Secondary | ICD-10-CM | POA: Diagnosis not present

## 2022-08-21 DIAGNOSIS — M79605 Pain in left leg: Secondary | ICD-10-CM | POA: Diagnosis not present

## 2022-08-23 DIAGNOSIS — M25552 Pain in left hip: Secondary | ICD-10-CM | POA: Diagnosis not present

## 2022-08-28 DIAGNOSIS — M25572 Pain in left ankle and joints of left foot: Secondary | ICD-10-CM | POA: Diagnosis not present

## 2022-09-03 DIAGNOSIS — D124 Benign neoplasm of descending colon: Secondary | ICD-10-CM | POA: Diagnosis not present

## 2022-09-03 DIAGNOSIS — K5730 Diverticulosis of large intestine without perforation or abscess without bleeding: Secondary | ICD-10-CM | POA: Diagnosis not present

## 2022-09-03 DIAGNOSIS — D125 Benign neoplasm of sigmoid colon: Secondary | ICD-10-CM | POA: Diagnosis not present

## 2022-09-03 DIAGNOSIS — D123 Benign neoplasm of transverse colon: Secondary | ICD-10-CM | POA: Diagnosis not present

## 2022-09-03 DIAGNOSIS — Z86010 Personal history of colonic polyps: Secondary | ICD-10-CM | POA: Diagnosis not present

## 2022-09-04 DIAGNOSIS — B029 Zoster without complications: Secondary | ICD-10-CM | POA: Diagnosis not present

## 2022-09-20 DIAGNOSIS — R269 Unspecified abnormalities of gait and mobility: Secondary | ICD-10-CM | POA: Diagnosis not present

## 2022-09-20 DIAGNOSIS — M79605 Pain in left leg: Secondary | ICD-10-CM | POA: Diagnosis not present

## 2022-10-04 DIAGNOSIS — R269 Unspecified abnormalities of gait and mobility: Secondary | ICD-10-CM | POA: Diagnosis not present

## 2022-10-04 DIAGNOSIS — M79605 Pain in left leg: Secondary | ICD-10-CM | POA: Diagnosis not present

## 2022-10-08 DIAGNOSIS — L821 Other seborrheic keratosis: Secondary | ICD-10-CM | POA: Diagnosis not present

## 2022-10-08 DIAGNOSIS — L570 Actinic keratosis: Secondary | ICD-10-CM | POA: Diagnosis not present

## 2022-10-08 DIAGNOSIS — B029 Zoster without complications: Secondary | ICD-10-CM | POA: Diagnosis not present

## 2022-10-08 DIAGNOSIS — L814 Other melanin hyperpigmentation: Secondary | ICD-10-CM | POA: Diagnosis not present

## 2022-10-08 DIAGNOSIS — Z789 Other specified health status: Secondary | ICD-10-CM | POA: Diagnosis not present

## 2022-10-08 DIAGNOSIS — L820 Inflamed seborrheic keratosis: Secondary | ICD-10-CM | POA: Diagnosis not present

## 2022-10-08 DIAGNOSIS — D1801 Hemangioma of skin and subcutaneous tissue: Secondary | ICD-10-CM | POA: Diagnosis not present

## 2022-10-08 DIAGNOSIS — R208 Other disturbances of skin sensation: Secondary | ICD-10-CM | POA: Diagnosis not present

## 2022-10-09 DIAGNOSIS — B029 Zoster without complications: Secondary | ICD-10-CM | POA: Diagnosis not present

## 2022-10-09 DIAGNOSIS — E782 Mixed hyperlipidemia: Secondary | ICD-10-CM | POA: Diagnosis not present

## 2022-10-09 DIAGNOSIS — M79605 Pain in left leg: Secondary | ICD-10-CM | POA: Diagnosis not present

## 2022-10-09 DIAGNOSIS — I10 Essential (primary) hypertension: Secondary | ICD-10-CM | POA: Diagnosis not present

## 2022-10-09 DIAGNOSIS — I493 Ventricular premature depolarization: Secondary | ICD-10-CM | POA: Diagnosis not present

## 2022-10-09 DIAGNOSIS — R269 Unspecified abnormalities of gait and mobility: Secondary | ICD-10-CM | POA: Diagnosis not present

## 2022-10-09 DIAGNOSIS — Z0000 Encounter for general adult medical examination without abnormal findings: Secondary | ICD-10-CM | POA: Diagnosis not present

## 2022-10-18 DIAGNOSIS — R269 Unspecified abnormalities of gait and mobility: Secondary | ICD-10-CM | POA: Diagnosis not present

## 2022-10-18 DIAGNOSIS — M79605 Pain in left leg: Secondary | ICD-10-CM | POA: Diagnosis not present

## 2022-11-01 DIAGNOSIS — R269 Unspecified abnormalities of gait and mobility: Secondary | ICD-10-CM | POA: Diagnosis not present

## 2022-11-01 DIAGNOSIS — M79605 Pain in left leg: Secondary | ICD-10-CM | POA: Diagnosis not present

## 2022-11-06 DIAGNOSIS — M7062 Trochanteric bursitis, left hip: Secondary | ICD-10-CM | POA: Diagnosis not present

## 2022-11-06 DIAGNOSIS — M25552 Pain in left hip: Secondary | ICD-10-CM | POA: Diagnosis not present

## 2023-02-22 ENCOUNTER — Other Ambulatory Visit: Payer: Self-pay | Admitting: Obstetrics and Gynecology

## 2023-02-22 DIAGNOSIS — Z1231 Encounter for screening mammogram for malignant neoplasm of breast: Secondary | ICD-10-CM

## 2023-04-09 ENCOUNTER — Ambulatory Visit
Admission: RE | Admit: 2023-04-09 | Discharge: 2023-04-09 | Disposition: A | Discharge status: Home / Self Care | Payer: BC Managed Care – PPO | Source: Ambulatory Visit | Attending: Obstetrics and Gynecology | Admitting: Obstetrics and Gynecology

## 2023-04-09 DIAGNOSIS — Z1231 Encounter for screening mammogram for malignant neoplasm of breast: Secondary | ICD-10-CM

## 2023-05-22 NOTE — Progress Notes (Signed)
62 y.o. [id number] Divorced Caucasian female here for annual exam.    Having urinary stress incontinence.  Urinary urgency and nocturia.  Wears a pad. Declines medication and treatment.   Declines STD screening today.   Working at the surgical center.   PCP:   Mosetta Putt, MD  No LMP recorded. Patient has had a hysterectomy.           Sexually active: Yes.    The current method of family planning is status post hysterectomy.    Exercising: Yes.     Personal trainer 3x a week Smoker:  former  Health Maintenance: Pap:  2013 normal History of abnormal Pap:  yes, 1995 hx of cryotherapy  MMG: 04/09/23:  3D w/Saline Implants/Neg/BiRads1  Colonoscopy:  2023 per pt, 5 polyps found, due in two years,10-24-15 Dr.Outlaw;next 5 years--per PCP not due  BMD:   01/31/09  Result  normal TDaP:  2013.  Received 2 years ago.  Gardasil:   no HIV: 1/24/0 NR Hep C: 04/26/13 neg Screening Labs:  PCP Shingles, pneumonia up to date with PCP.    reports that she has quit smoking. Her smoking use included cigarettes. She has never used smokeless tobacco. She reports current alcohol use of about 6.0 - 8.0 standard drinks of alcohol per week. She reports that she does not currently use drugs.  Past Medical History:  Diagnosis Date   Adenomatous polyps    Anemia    Anxiety    Asthma    Blood transfusion without reported diagnosis 01/1999   following hysterectomy   Bulging discs    Endometriosis    History of atrial fibrillation    History of COVID-19    12/2018, 10/2020   Hx of respiratory failure    Hyperlipidemia    Hypertension    PONV (postoperative nausea and vomiting)    PVC (premature ventricular contraction)    Stroke [facility])    Urinary incontinence     Past Surgical History:  Procedure Laterality Date   5 back surgeries     ABDOMINAL HYSTERECTOMY  2001   TAH/RSO   AUGMENTATION MAMMAPLASTY Bilateral 2013   saline. pre pectoral   BLADDER SUSPENSION  2003   LSO and rectocele repair  at [facility]   BREAST SURGERY     breast augmentation--saline implants   EPIDURAL BLOCK INJECTION     HERNIA REPAIR     I & D EXTREMITY Right 06/10/2021   Procedure: IRRIGATION AND DEBRIDEMENT ACHILLES;  Surgeon: Teryl Lucy, MD;  Location: [facility];  Service: Orthopedics;  Laterality: Right;   LUMBAR FUSION  2010   Dr. Channing Mutters   REDUCTION MAMMAPLASTY Bilateral    REPLACEMENT TOTAL HIP W/  RESURFACING IMPLANTS  10/2021   SPINAL FUSION  2018   Dr. Danielle Dess   SPINAL FUSION  04/06/2020   SPINE SURGERY      Current Outpatient Medications  Medication Sig Dispense Refill   acetaminophen (TYLENOL) 325 MG tablet Take 2 tablets (650 mg total) by mouth every 6 (six) hours as needed. 60 tablet 1   albuterol (PROVENTIL) (2.5 MG/3ML) 0.083% nebulizer solution Take 3 mLs (2.5 mg total) by nebulization 2 (two) times daily as needed for wheezing or shortness of breath. 75 mL 0   ALPRAZolam (XANAX) 0.5 MG tablet Take 0.5 mg by mouth daily as needed for anxiety. for anxiety  1   atorvastatin (LIPITOR) 40 MG tablet Take 40 mg by mouth daily.     benzonatate (TESSALON) 100 MG capsule Take 1  capsule (100 mg total) by mouth every 8 (eight) hours. 21 capsule 0   buPROPion (WELLBUTRIN XL) 300 MG 24 hr tablet Take 300 mg by mouth every morning.     buPROPion (ZYBAN) 150 MG 12 hr tablet Take 150 mg by mouth daily.     ezetimibe (ZETIA) 10 MG tablet Take 1 tablet (10 mg total) by mouth daily. 30 tablet 4   fluticasone (FLONASE) 50 MCG/ACT nasal spray Place 2 sprays into both nostrils daily as needed for rhinitis.     losartan (COZAAR) 100 MG tablet Take 100 mg by mouth daily.      metoprolol succinate (TOPROL-XL) 25 MG 24 hr tablet Take 25 mg by mouth daily.     metoprolol tartrate (LOPRESSOR) 25 MG tablet TAKE 1 TABLET BY MOUTH AS NEEDED FOR PALPITATIONS 30 tablet 0   oxyCODONE (ROXICODONE) 5 MG immediate release tablet Take 1 tablet (5 mg total) by mouth every 6 (six) hours as needed for severe pain. 5 tablet 0    rosuvastatin (CRESTOR) 40 MG tablet Take 40 mg by mouth at bedtime.     triamterene-hydrochlorothiazide (DYAZIDE) 37.5-25 MG capsule Take 1 capsule by mouth daily.     zolpidem (AMBIEN) 10 MG tablet Take 5 mg by mouth at bedtime as needed for sleep.     No current facility-administered medications for this visit.    Family History  Problem Relation Age of Onset   Hypertension Mother    Stroke Mother    Hypertension Father    Cancer Father    Dementia Father    Hyperlipidemia Sister    Hypertension Sister    Hyperlipidemia Brother    Hypertension Brother    Hypertension Brother    Hypertension Brother    Cancer Maternal Uncle 64       colon ca   Stroke Maternal Uncle    Breast cancer Maternal Grandmother    Cancer Maternal Grandmother 60       colon ca   Stroke Maternal Aunt    Stroke Paternal Grandfather     Review of Systems  All other systems reviewed and are negative.   Exam:   BP 118/72 (BP Location: Right Arm, Patient Position: Sitting, Cuff Size: Normal)   Ht 5\' 4"  (1.626 m)   Wt 141 lb (64 kg)   BMI 24.20 kg/m     General appearance: alert, cooperative and appears stated age Head: normocephalic, without obvious abnormality, atraumatic Neck: no adenopathy, supple, symmetrical, trachea midline and thyroid normal to inspection and palpation Lungs: clear to auscultation bilaterally Breasts: bilateral implants present, no masses or tenderness, No nipple retraction or dimpling, No nipple discharge or bleeding, No axillary adenopathy Heart: regular rate and rhythm Abdomen: soft, non-tender; no masses, no organomegaly Extremities: extremities normal, atraumatic, no cyanosis or edema Skin: skin color, texture, turgor normal. No rashes or lesions Lymph nodes: cervical, supraclavicular, and axillary nodes normal. Neurologic: grossly normal  Pelvic: External genitalia:  no lesions              No abnormal inguinal nodes palpated.              Urethra:  normal  appearing urethra with no masses, tenderness or lesions              Bartholins and Skenes: normal                 Vagina: normal appearing vagina with normal color and discharge, no lesions  Cervix: absent              Pap taken: no Bimanual Exam:  Uterus:  absent              Adnexa: no mass, fullness, tenderness              Rectal exam: yes.  Confirms.              Anus:  normal sphincter tone, no lesions  Chaperone was present for exam:  Warren Lacy, CMA  Assessment:   Well woman visit with gynecologic exam. Status post TAH/RSO. Remote hx abnormal paps. Status post LSO/sacrocolpopexy.  Status post midurethral sling.   Mixed incontinence. Bilateral breast implants. Hx stroke.  Hx respiratory failure.  Likely was Covid. Hx episode of atrial fibrillation.   Plan: Mammogram screening discussed. Self breast awareness reviewed. Pap and HR HPV not indicated. Guidelines for Calcium, Vitamin D, regular exercise program including cardiovascular and weight bearing exercise. We discussed potential referral to [facility] for evaluation and treatment of mixed incontinence.   She will consider for the future.  Follow up annually and prn.   After visit summary provided.

## 2023-05-23 ENCOUNTER — Emergency Department (HOSPITAL_BASED_OUTPATIENT_CLINIC_OR_DEPARTMENT_OTHER)
Admission: EM | Admit: 2023-05-23 | Discharge: 2023-05-23 | Disposition: A | Discharge status: Home / Self Care | Payer: Worker's Compensation | Attending: Emergency Medicine | Admitting: Emergency Medicine

## 2023-05-23 ENCOUNTER — Other Ambulatory Visit: Payer: Self-pay

## 2023-05-23 ENCOUNTER — Encounter (HOSPITAL_BASED_OUTPATIENT_CLINIC_OR_DEPARTMENT_OTHER): Payer: Self-pay | Admitting: Emergency Medicine

## 2023-05-23 DIAGNOSIS — Z79899 Other long term (current) drug therapy: Secondary | ICD-10-CM | POA: Diagnosis not present

## 2023-05-23 DIAGNOSIS — I10 Essential (primary) hypertension: Secondary | ICD-10-CM | POA: Diagnosis not present

## 2023-05-23 DIAGNOSIS — M545 Low back pain, unspecified: Secondary | ICD-10-CM | POA: Insufficient documentation

## 2023-05-23 DIAGNOSIS — Z87891 Personal history of nicotine dependence: Secondary | ICD-10-CM | POA: Diagnosis not present

## 2023-05-23 MED ORDER — OXYCODONE HCL 5 MG PO TABS: 5.0000 mg | ORAL_TABLET | Freq: Four times a day (QID) | ORAL | 0 refills | Status: DC | PRN

## 2023-05-23 MED ORDER — METHYLPREDNISOLONE 4 MG PO TBPK: ORAL_TABLET | ORAL | 0 refills | Status: DC

## 2023-05-23 MED ORDER — KETOROLAC TROMETHAMINE 30 MG/ML IJ SOLN
30.0000 mg | Freq: Once | INTRAMUSCULAR | Status: AC
  Administered 2023-05-23: 30 mg via INTRAMUSCULAR
  Filled 2023-05-23: qty 1

## 2023-05-23 NOTE — ED Triage Notes (Signed)
Pt arrived POV, coax4, ambulatory stating she went to push an obese pt in a wheelchair and had sudden onset of sharp pain in R lower back which worsens on movement and position changes. Pt reports hx of 5 back surgeries. Pt denies fall. Pt denies numbness/tingling or weakness in extremities.   Robaxin 500mg  at 945

## 2023-05-23 NOTE — ED Provider Notes (Signed)
[place] [facility]GENC[facility] DE[facility]MENT AT [facility] Provider Note   CSN: [id number] Arrival date & time: 05/23/23  1102     History  Chief Complaint  Patient presents with   Back Pain    [name] is a 62 y.o. female.   Back Pain   62 year old female presents emergency department with complaints of back pain.  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  She states that she did not realize that one of the wheels in the wheelchair was locked.  Reported right lower back/buttock pain beginning abruptly.  Denies any subsequent trauma.  Denies radiation of pain down leg.  States that she took at home Robaxin which helped some.  Presents emergency department for further assessment.  Patient reports history of 5 surgeries on her lumbar spine.  Denies any weakness or sensory deficits in lower extremities, saddle anesthesia, bowel/bladder dysfunction, history of prolonged corticosteroid use, fever, history of IV drug use.  Denies urinary symptoms, abdominal pain, nausea, vomiting, change in bowel habits.  Past medical history significant for hypertension, hyperlipidemia, PVC, CVA, lumbar stenosis,  Home Medications Prior to Admission medications   Medication Sig Start Date End Date Taking? Authorizing Provider  methylPREDNISolone (MEDROL DOSEPAK) 4 MG TBPK tablet Rasa Matkowski 05/23/23  Yes Sherian Maroon A, PA  oxyCODONE (ROXICODONE) 5 MG immediate release tablet Take 1 tablet (5 mg total) by mouth every 6 (six) hours as needed for severe pain. 05/23/23  Yes Sherian Maroon A, PA  acetaminophen (TYLENOL) 325 MG tablet Take 2 tablets (650 mg total) by mouth every 6 (six) hours as needed. 06/10/21   Janine Ores K, PA-C  albuterol (PROVENTIL) (2.5 MG/3ML) 0.083% nebulizer solution Take 3 mLs (2.5 mg total) by nebulization 2 (two) times daily as needed for wheezing or shortness of breath. 01/18/19   Darlin Drop, DO   ALPRAZolam Prudy Feeler) 0.5 MG tablet Take 0.5 mg by mouth daily as needed for anxiety. for anxiety 06/11/18   [provider]  atorvastatin (LIPITOR) 40 MG tablet Take 40 mg by mouth daily. 04/14/21   [provider]  benzonatate (TESSALON) 100 MG capsule Take 1 capsule (100 mg total) by mouth every 8 (eight) hours. 07/19/21   Fayrene Helper, PA-C  buPROPion (ZYBAN) 150 MG 12 hr tablet Take 150 mg by mouth daily.    [provider]  ezetimibe (ZETIA) 10 MG tablet Take 1 tablet (10 mg total) by mouth daily. 08/28/18   Ihor Austin, NP  fluticasone (FLONASE) 50 MCG/ACT nasal spray Place 2 sprays into both nostrils daily as needed for rhinitis. 04/07/21   [provider]  fluticasone-salmeterol (ADVAIR) 250-50 MCG/ACT AEPB Inhale 1 puff into the lungs 2 (two) times daily as needed (shortness of breath).    [provider]  losartan (COZAAR) 100 MG tablet Take 100 mg by mouth daily.     [provider]  metoprolol succinate (TOPROL-XL) 25 MG 24 hr tablet Take 25 mg by mouth daily.    [provider]  metoprolol tartrate (LOPRESSOR) 25 MG tablet TAKE 1 TABLET BY MOUTH AS NEEDED FOR PALPITATIONS 07/03/21   Lewayne Bunting, MD  triamterene-hydrochlorothiazide (DYAZIDE) 37.5-25 MG capsule Take 1 capsule by mouth daily.    [provider]  zolpidem (AMBIEN) 10 MG tablet Take 5 mg by mouth at bedtime as needed for sleep.    [provider]      Allergies    Ancef [  cefazolin], Ceftin [cefuroxime], Penicillins, Gabapentin, and Other    Review of Systems   Review of Systems  Musculoskeletal:  Positive for back pain.  All other systems reviewed and are negative.   Physical Exam Updated Vital Signs BP (!) 162/103 (BP Location: Right Arm)   Pulse 81   Temp 98.1 F (36.7 C) (Oral)   Resp 18   Ht 5\' 4"  (1.626 m)   Wt 63.5 kg   SpO2 98%   BMI 24.03 kg/m  Physical Exam Vitals and nursing note reviewed.  Constitutional:       General: She is not in acute distress.    Appearance: She is well-developed.  HENT:     Head: Normocephalic and atraumatic.  Eyes:     Conjunctiva/sclera: Conjunctivae normal.  Cardiovascular:     Rate and Rhythm: Normal rate and regular rhythm.     Heart sounds: No murmur heard. Pulmonary:     Effort: Pulmonary effort is normal. No respiratory distress.     Breath sounds: Normal breath sounds.  Abdominal:     Palpations: Abdomen is soft.     Tenderness: There is no abdominal tenderness.  Musculoskeletal:        General: No swelling.     Cervical back: Neck supple.     Comments: No midline tenderness of cervical, thoracic, lumbar spine with no obvious step-off or Forei noted.  Paraspinal tenderness noted in the right lower lumbar region with radiation into the buttock.  Straight leg raise negative bilaterally.  Muscular strength 5 5 for lower extremities.  Patient with full range of motion of bilateral lower extremities at hips, knees, ankles, digits.  No sensory deficits along major nerve distributions of lower extremities.  Pedal pulses 2+ bilaterally.  Skin:    General: Skin is warm and dry.     Capillary Refill: Capillary refill takes less than 2 seconds.  Neurological:     Mental Status: She is alert.  Psychiatric:        Mood and Affect: Mood normal.     ED Results / Procedures / Treatments   Labs (all labs ordered are listed, but only abnormal results are displayed) Labs Reviewed - No data to display  EKG None  Radiology No results found.  Procedures Procedures    Medications Ordered in ED Medications  ketorolac (TORADOL) 30 MG/ML injection 30 mg (30 mg Intramuscular Given 05/23/23 1325)    ED Course/ Medical Decision Making/ A&P                             Medical Decision Making Risk Prescription drug management.   This patient presents to the ED for concern of back pain, this involves an extensive number of treatment options, and is a complaint that  carries with it a high risk of complications and morbidity.  The differential diagnosis includes fracture, dislocation, strain/sprain, pyelonephritis, nephrolithiasis, aortic aneurysm, aortic dissection, spinal cord impingement, cauda equina, spinal epidural abscess   Co morbidities that complicate the patient evaluation  See HPI   Additional history obtained:  Additional history obtained from EMR External records from outside source obtained and reviewed including hospital records   Lab Tests:  N/a   Imaging Studies ordered:  N/a   Cardiac Monitoring: / EKG:  The patient was maintained on a cardiac monitor.  I personally viewed and interpreted the cardiac monitored which showed an underlying rhythm of: Sinus rhythm   Consultations Obtained:  N/a  Problem List / ED Course / Critical interventions / Medication management  Low back pain I ordered medication including Toradol   Reevaluation of the patient after these medicines showed that the patient improved I have reviewed the patients home medicines and have made adjustments as needed   Social Determinants of Health:  Former cigarette use.  Denies illicit drug use.    Test / Admission - Considered:  Low back pain Vitals signs significant for hypertension blood pressure 162 over. Otherwise within normal range and stable throughout visit. 62 year old female presents emergency department with complaints of right low back pain after an injury suffered at work trying to push a patient in a wheelchair that was locked.  Patient without evidence of red flag signs of back pain on physical exam or HPI.  Low suspicion for intra-abdominal pathology such as pyelonephritis, nephrolithiasis, aortic aneurysm, aortic dissection.  Symptoms most likely secondary to muscular injury.  Patient states that she has noted significant improvement in the past with similar presentation with muscle laxer, Medrol Dosepak of which will be  prescribed.  Will recommend close follow-up with spinal specialist outpatient setting given patient's complex lumbar history.  Patient overall well-appearing, afebrile in no acute distress.  Treatment plan discussed at length with patient and she acknowledged understanding was agreeable to said plan. Worrisome signs and symptoms were discussed with the patient, and the patient acknowledged understanding to return to the ED if noticed. Patient was stable upon discharge.          Final Clinical Impression(s) / ED Diagnoses Final diagnoses:  Acute right-sided low back pain without sciatica    Rx / DC Orders ED Discharge Orders          Ordered    methylPREDNISolone (MEDROL DOSEPAK) 4 MG TBPK tablet        05/23/23 1330                   Peter Garter, [place] 05/23/23 1649    Blane Ohara, MD 05/25/23 [phone]

## 2023-05-23 NOTE — ED Notes (Signed)
Discharge paperwork given and verbally understood. 

## 2023-05-23 NOTE — Discharge Instructions (Signed)
As discussed, workup today overall reassuring.  I suspect your injury is mostly muscular in etiology.  Will treat with Medrol Dosepak as well as muscle laxer at home.  Recommend follow-up with spinal specialist if symptoms are prolonged.  Please do not hesitate to return to emergency department for worrisome signs and symptoms we discussed become apparent.

## 2023-05-23 NOTE — ED Notes (Signed)
Courtney - Consulting civil engineer aware of pt's BP.

## 2023-06-04 ENCOUNTER — Other Ambulatory Visit: Payer: Self-pay | Admitting: Orthopedic Surgery

## 2023-06-04 ENCOUNTER — Encounter: Payer: Self-pay | Admitting: Obstetrics and Gynecology

## 2023-06-04 ENCOUNTER — Ambulatory Visit (INDEPENDENT_AMBULATORY_CARE_PROVIDER_SITE_OTHER): Payer: BC Managed Care – PPO | Admitting: Obstetrics and Gynecology

## 2023-06-04 VITALS — BP 118/72 | Ht 64.0 in | Wt 141.0 lb

## 2023-06-04 DIAGNOSIS — Z01419 Encounter for gynecological examination (general) (routine) without abnormal findings: Secondary | ICD-10-CM

## 2023-06-04 DIAGNOSIS — M259 Joint disorder, unspecified: Secondary | ICD-10-CM

## 2023-06-04 NOTE — Patient Instructions (Signed)

## 2023-06-26 ENCOUNTER — Ambulatory Visit
Admission: RE | Admit: 2023-06-26 | Discharge: 2023-06-26 | Disposition: A | Discharge status: Home / Self Care | Payer: No Typology Code available for payment source | Source: Ambulatory Visit | Attending: Orthopedic Surgery | Admitting: Orthopedic Surgery

## 2023-06-26 DIAGNOSIS — M259 Joint disorder, unspecified: Secondary | ICD-10-CM

## 2023-07-16 ENCOUNTER — Other Ambulatory Visit: Payer: Self-pay | Admitting: Neurological Surgery

## 2023-07-16 DIAGNOSIS — S32009K Unspecified fracture of unspecified lumbar vertebra, subsequent encounter for fracture with nonunion: Secondary | ICD-10-CM

## 2023-08-05 ENCOUNTER — Ambulatory Visit
Admission: RE | Admit: 2023-08-05 | Discharge: 2023-08-05 | Disposition: A | Discharge status: Home / Self Care | Payer: BC Managed Care – PPO | Source: Ambulatory Visit | Attending: Neurological Surgery | Admitting: Neurological Surgery

## 2023-08-05 DIAGNOSIS — S32009K Unspecified fracture of unspecified lumbar vertebra, subsequent encounter for fracture with nonunion: Secondary | ICD-10-CM

## 2023-09-19 ENCOUNTER — Ambulatory Visit: Payer: BC Managed Care – PPO | Admitting: Podiatry

## 2023-10-03 ENCOUNTER — Ambulatory Visit: Payer: BC Managed Care – PPO | Admitting: Podiatry

## 2023-10-03 ENCOUNTER — Encounter: Payer: Self-pay | Admitting: Podiatry

## 2023-10-03 DIAGNOSIS — L600 Ingrowing nail: Secondary | ICD-10-CM

## 2023-10-03 NOTE — Patient Instructions (Signed)

## 2023-10-04 NOTE — Progress Notes (Signed)
Subjective:   Patient ID: [name], female   DOB: 62 y.o.   MRN: [id number]   HPI Patient presents with chronic ingrown toenail deformity of the big toes both feet that have been very sore and making it hard for her to wear shoe gear comfortably.  States that she has trim them and other modalities without relief and at both borders both big toes.  Patient does not smoke likes to be active   Review of Systems  All other systems reviewed and are negative.       Objective:  Physical Exam  Neurovascular status found to be intact muscle strength found to be adequate range of motion within normal limits.  Patient is found to have incurvated medial and lateral borders of the big toes of both feet that are very painful with rest and make shoe gear difficult.  The nails themselves are moderately deformed but it is mostly in the corners that the structural events occur.  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  I explained procedure risk and reviewed the permanent nature of this condition and at this point she signed consent form.  I infiltrated each big toe 60 mg Xylocaine Marcaine mixture and I applied phenol 3 applications 30 seconds to each border followed by alcohol lavage for permanent eradication of the nail that.  Patient tolerated well sterile dressing applied wear 24 hours take it off earlier if throbbing were to occur and encouraged her to call with questions concerns which may arise

## 2023-10-06 ENCOUNTER — Other Ambulatory Visit: Payer: Self-pay

## 2023-10-06 ENCOUNTER — Other Ambulatory Visit (HOSPITAL_BASED_OUTPATIENT_CLINIC_OR_DEPARTMENT_OTHER): Payer: Self-pay

## 2023-10-06 ENCOUNTER — Emergency Department (HOSPITAL_BASED_OUTPATIENT_CLINIC_OR_DEPARTMENT_OTHER)
Admission: EM | Admit: 2023-10-06 | Discharge: 2023-10-06 | Disposition: A | Discharge status: Home / Self Care | Payer: BC Managed Care – PPO | Attending: Emergency Medicine | Admitting: Emergency Medicine

## 2023-10-06 ENCOUNTER — Encounter (HOSPITAL_BASED_OUTPATIENT_CLINIC_OR_DEPARTMENT_OTHER): Payer: Self-pay | Admitting: Emergency Medicine

## 2023-10-06 DIAGNOSIS — L03039 Cellulitis of unspecified toe: Secondary | ICD-10-CM

## 2023-10-06 DIAGNOSIS — Z79899 Other long term (current) drug therapy: Secondary | ICD-10-CM | POA: Insufficient documentation

## 2023-10-06 DIAGNOSIS — R2243 Localized swelling, mass and lump, lower limb, bilateral: Secondary | ICD-10-CM | POA: Diagnosis present

## 2023-10-06 DIAGNOSIS — I10 Essential (primary) hypertension: Secondary | ICD-10-CM | POA: Diagnosis not present

## 2023-10-06 MED ORDER — DOXYCYCLINE HYCLATE 100 MG PO CAPS: 100.0000 mg | ORAL_CAPSULE | Freq: Two times a day (BID) | ORAL | 0 refills | Status: AC

## 2023-10-06 MED ORDER — DOXYCYCLINE HYCLATE 100 MG PO CAPS
100.0000 mg | ORAL_CAPSULE | Freq: Two times a day (BID) | ORAL | 0 refills | Status: DC
  Filled 2023-10-06: qty 14, 7d supply, fill #0

## 2023-10-06 NOTE — Discharge Instructions (Signed)
You have a mild infection around the surgical site.  He should complete entire course of antibiotics and call your podiatrist on Monday.  You should continue using the soaks for your feet twice daily.  If you develop fever, worsening pain or swelling you should return to the ED.

## 2023-10-06 NOTE — ED Provider Notes (Signed)
[place] EMERGENCY DEPARTMENT AT [facility] Provider Note   CSN: [id number] Arrival date & time: 10/06/23  1004     History  Chief Complaint  Patient presents with   Toe Pain    [name] is a 62 y.o. female.  HPI 62 year old female history of hypertension, hyperlipidemia presenting for bilateral foot swelling.  Patient states 3 days ago she had bilateral ingrown toenails removed from her first toes.  She has been doing the dressing changes and have some baths but has noted redness and swelling around the area and some possible purulence.  No fevers or chills.  She is not diabetic.  She is able to move her toes without difficulty.  Mild tenderness.  Redness is not tracking up her legs.     Home Medications Prior to Admission medications   Medication Sig Start Date End Date Taking? Authorizing Provider  acetaminophen (TYLENOL) 325 MG tablet Take 2 tablets (650 mg total) by mouth every 6 (six) hours as needed. 06/10/21   Janine Ores K, PA-C  albuterol (PROVENTIL) (2.5 MG/3ML) 0.083% nebulizer solution Take 3 mLs (2.5 mg total) by nebulization 2 (two) times daily as needed for wheezing or shortness of breath. 01/18/19   Darlin Drop, DO  ALPRAZolam Prudy Feeler) 0.5 MG tablet Take 0.5 mg by mouth daily as needed for anxiety. for anxiety 06/11/18   [provider]  atorvastatin (LIPITOR) 40 MG tablet Take 40 mg by mouth daily. 04/14/21   [provider]  benzonatate (TESSALON) 100 MG capsule Take 1 capsule (100 mg total) by mouth every 8 (eight) hours. 07/19/21   Fayrene Helper, PA-C  buPROPion (WELLBUTRIN XL) 300 MG 24 hr tablet Take 300 mg by mouth every morning.    [provider]  buPROPion (ZYBAN) 150 MG 12 hr tablet Take 150 mg by mouth daily.    [provider]  doxycycline (VIBRAMYCIN) 100 MG capsule Take 1 capsule (100 mg total) by mouth 2 (two) times daily for 7 days. 10/06/23 10/13/23  Laurence Spates, MD  ezetimibe (ZETIA) 10  MG tablet Take 1 tablet (10 mg total) by mouth daily. 08/28/18   Ihor Austin, NP  fluticasone (FLONASE) 50 MCG/ACT nasal spray Place 2 sprays into both nostrils daily as needed for rhinitis. 04/07/21   [provider]  losartan (COZAAR) 100 MG tablet Take 100 mg by mouth daily.     [provider]  metoprolol succinate (TOPROL-XL) 25 MG 24 hr tablet Take 25 mg by mouth daily.    [provider]  metoprolol tartrate (LOPRESSOR) 25 MG tablet TAKE 1 TABLET BY MOUTH AS NEEDED FOR PALPITATIONS 07/03/21   Lewayne Bunting, MD  oxyCODONE (ROXICODONE) 5 MG immediate release tablet Take 1 tablet (5 mg total) by mouth every 6 (six) hours as needed for severe pain. 05/23/23   Peter Garter, PA  rosuvastatin (CRESTOR) 40 MG tablet Take 40 mg by mouth at bedtime. 05/30/23   [provider]  triamterene-hydrochlorothiazide (DYAZIDE) 37.5-25 MG capsule Take 1 capsule by mouth daily.    [provider]  zolpidem (AMBIEN) 10 MG tablet Take 5 mg by mouth at bedtime as needed for sleep.    [provider]      Allergies    Ancef [cefazolin], Ceftin [cefuroxime], Penicillins, Gabapentin, and Other    Review of Systems   Review of Systems Review of systems completed and notable as per HPI.  ROS otherwise negative.   Physical Exam Updated Vital Signs  BP (!) 149/78 (BP Location: Right Arm)   Pulse 71   Temp 98.1 F (36.7 C)   Resp 16   SpO2 99%  Physical Exam Vitals and nursing note reviewed.  Constitutional:      General: She is not in acute distress.    Appearance: She is well-developed.  HENT:     Head: Normocephalic and atraumatic.  Eyes:     Conjunctiva/sclera: Conjunctivae normal.  Cardiovascular:     Rate and Rhythm: Normal rate and regular rhythm.     Heart sounds: No murmur heard. Pulmonary:     Effort: Pulmonary effort is normal. No respiratory distress.     Breath sounds: Normal breath sounds.  Abdominal:     Palpations: Abdomen is  soft.     Tenderness: There is no abdominal tenderness.  Musculoskeletal:        General: No swelling.     Cervical back: Neck supple.     Right lower leg: No edema.     Left lower leg: No edema.     Comments: Erythema mild swelling around the first toes bilaterally.  There is mild erythema over the distal phalanx on either side but no redness tracking up the foot.  2+ DP and PT pulse.  Normal capillary refill.  No gross purulence.  No crepitus.  Full range of motion without pain.  Skin:    General: Skin is warm and dry.     Capillary Refill: Capillary refill takes less than 2 seconds.  Neurological:     Mental Status: She is alert.  Psychiatric:        Mood and Affect: Mood normal.     ED Results / Procedures / Treatments   Labs (all labs ordered are listed, but only abnormal results are displayed) Labs Reviewed - No data to display  EKG None  Radiology No results found.  Procedures Procedures    Medications Ordered in ED Medications - No data to display  ED Course/ Medical Decision Making/ A&P Clinical Course as of 10/06/23 1903  Wynelle Link Oct 06, 2023  1151 [place]: betadine soaks BID, doxy, call office Monday [JD]    Clinical Course User Index [JD] Laurence Spates, MD                                 Medical Decision Making Risk Prescription drug management.   Medical Decision Making:   [name] is a 62 y.o. female who presented to the ED today with concern for infection after recent ingrown toenail removal.  Patient afebrile, well-appearing.  No signs of systemic illness or sepsis.  On exam patient has some erythema and possible purulence at the site of the procedure.  Consistent with cellulitis, I do not see any signs of drainable abscess.  Low suspicion for necrotizing infection or osteomyelitis.  I talked with Dr. Jamse Arn with podiatry who recommends Betadine soaks at home, antibiotics and follow-up this week in office.  I gave a prescription for  doxycycline to the patient and discussed local wound care, Betadine soaks, and calling podiatry tomorrow to schedule close follow-up.  Strict return precautions given for fever, worsening redness, or other new concerning symptoms.   Patient placed on continuous vitals and telemetry monitoring while in ED which was reviewed periodically.  Reviewed and confirmed nursing documentation for past medical history, family history, social history.  Patient's presentation is most consistent with acute, uncomplicated illness.  Final Clinical Impression(s) / ED Diagnoses Final diagnoses:  Cellulitis of toe, unspecified laterality    Rx / DC Orders ED Discharge Orders          Ordered    doxycycline (VIBRAMYCIN) 100 MG capsule  2 times daily,   Status:  Discontinued        10/06/23 1154    doxycycline (VIBRAMYCIN) 100 MG capsule  2 times daily        10/06/23 1214              Laurence Spates, MD 10/06/23 [phone]

## 2023-10-06 NOTE — ED Triage Notes (Signed)
Ingrown toenails removed from bilateral big toes surgically on Thursday . Pt presents with red/swelling, draining pus. No fevers

## 2023-10-07 ENCOUNTER — Encounter: Payer: Self-pay | Admitting: Podiatry

## 2023-10-07 ENCOUNTER — Telehealth: Payer: Self-pay | Admitting: Podiatry

## 2023-10-07 ENCOUNTER — Ambulatory Visit (INDEPENDENT_AMBULATORY_CARE_PROVIDER_SITE_OTHER): Payer: BC Managed Care – PPO | Admitting: Podiatry

## 2023-10-07 DIAGNOSIS — L600 Ingrowing nail: Secondary | ICD-10-CM

## 2023-10-07 MED ORDER — MUPIROCIN 2 % EX OINT: 1.0000 | TOPICAL_OINTMENT | Freq: Two times a day (BID) | CUTANEOUS | 2 refills | Status: DC

## 2023-10-07 NOTE — Patient Instructions (Signed)

## 2023-10-09 ENCOUNTER — Encounter: Payer: Self-pay | Admitting: Podiatry

## 2023-10-09 ENCOUNTER — Other Ambulatory Visit: Payer: Self-pay | Admitting: Podiatry

## 2023-10-09 MED ORDER — CLINDAMYCIN HCL 300 MG PO CAPS: 300.0000 mg | ORAL_CAPSULE | Freq: Three times a day (TID) | ORAL | 0 refills | Status: DC

## 2023-10-13 NOTE — Progress Notes (Signed)
Subjective: Chief Complaint  Patient presents with   Nail Problem    PATIENT STATES SHE ER AND THEY GAVE HER SOME INTRUCTIONS ON WHAT TO DO AND ALSO TO BE SEEN BY FOOT DOCTOR.   62 year old female presents the office today for concerns of infection to her hallux toenails.  She recently underwent partial nail avulsions for ingrown toenails with Dr. Charlsie Merles.  Over the weekend she did go to the ER for this as well.  She switched to soaking in Betadine.  Doxycycline has been ordered and she just recently started this.   Objective: AAO x3, NAD DP/PT pulses palpable bilaterally, CRT less than 3 seconds Status post partial nail avulsions.  Granulation tissue is noted along the procedure sites.  There is localized edema and erythema.  There is no purulence.  No ascending cellulitis.  No fluctuation or crepitation. No pain with calf compression, swelling, warmth, erythema  Assessment: Status post partial nail avulsions with localized erythema  Plan: -All treatment options discussed with the patient including all alternatives, risks, complications.  -I cleaned the area.  Recommend he switch that he is in Epsom salts soaks twice a day covered with antibiotic ointment and a bandage.  Prescribed mupirocin.  She will continue doxycycline for now since she just recently started this but if no improvement will switch to Bactrim most likely.  -Patient encouraged to call the office with any questions, concerns, change in symptoms.   Return in about 2 weeks (around 10/21/2023), or if symptoms worsen or fail to improve, for nail check.  Vivi Barrack DPM

## 2023-10-21 ENCOUNTER — Emergency Department (HOSPITAL_COMMUNITY)
Admission: EM | Admit: 2023-10-21 | Discharge: 2023-10-21 | Disposition: A | Discharge status: Home / Self Care | Payer: BC Managed Care – PPO | Attending: Emergency Medicine | Admitting: Emergency Medicine

## 2023-10-21 ENCOUNTER — Other Ambulatory Visit: Payer: Self-pay

## 2023-10-21 ENCOUNTER — Emergency Department (HOSPITAL_COMMUNITY): Payer: BC Managed Care – PPO

## 2023-10-21 DIAGNOSIS — R1310 Dysphagia, unspecified: Secondary | ICD-10-CM | POA: Diagnosis not present

## 2023-10-21 DIAGNOSIS — R479 Unspecified speech disturbances: Secondary | ICD-10-CM | POA: Diagnosis present

## 2023-10-21 DIAGNOSIS — R2990 Unspecified symptoms and signs involving the nervous system: Secondary | ICD-10-CM | POA: Diagnosis not present

## 2023-10-21 DIAGNOSIS — F8081 Childhood onset fluency disorder: Secondary | ICD-10-CM | POA: Diagnosis not present

## 2023-10-21 LAB — DIFFERENTIAL
Abs Immature Granulocytes: 0.02 10*3/uL (ref 0.00–0.07)
Basophils Absolute: 0 10*3/uL (ref 0.0–0.1)
Basophils Relative: 1 %
Eosinophils Absolute: 0 10*3/uL (ref 0.0–0.5)
Eosinophils Relative: 1 %
Immature Granulocytes: 0 %
Lymphocytes Relative: 25 %
Lymphs Abs: 1.2 10*3/uL (ref 0.7–4.0)
Monocytes Absolute: 0.6 10*3/uL (ref 0.1–1.0)
Monocytes Relative: 13 %
Neutro Abs: 2.8 10*3/uL (ref 1.7–7.7)
Neutrophils Relative %: 60 %

## 2023-10-21 LAB — I-STAT CHEM 8, ED
BUN: 22 mg/dL (ref 8–23)
Calcium, Ion: 1.1 mmol/L — ABNORMAL LOW (ref 1.15–1.40)
Chloride: 99 mmol/L (ref 98–111)
Creatinine, Ser: 1.2 mg/dL — ABNORMAL HIGH (ref 0.44–1.00)
Glucose, Bld: 93 mg/dL (ref 70–99)
HCT: 35 % — ABNORMAL LOW (ref 36.0–46.0)
Hemoglobin: 11.9 g/dL — ABNORMAL LOW (ref 12.0–15.0)
Potassium: 2.8 mmol/L — ABNORMAL LOW (ref 3.5–5.1)
Sodium: 140 mmol/L (ref 135–145)
TCO2: 26 mmol/L (ref 22–32)

## 2023-10-21 LAB — COMPREHENSIVE METABOLIC PANEL
ALT: 122 U/L — ABNORMAL HIGH (ref 0–44)
AST: 107 U/L — ABNORMAL HIGH (ref 15–41)
Albumin: 3.8 g/dL (ref 3.5–5.0)
Alkaline Phosphatase: 73 U/L (ref 38–126)
Anion gap: 15 (ref 5–15)
BUN: 19 mg/dL (ref 8–23)
CO2: 25 mmol/L (ref 22–32)
Calcium: 9.5 mg/dL (ref 8.9–10.3)
Chloride: 99 mmol/L (ref 98–111)
Creatinine, Ser: 1.27 mg/dL — ABNORMAL HIGH (ref 0.44–1.00)
GFR, Estimated: 48 mL/min — ABNORMAL LOW (ref >60)
Glucose, Bld: 94 mg/dL (ref 70–99)
Potassium: 2.8 mmol/L — ABNORMAL LOW (ref 3.5–5.1)
Sodium: 139 mmol/L (ref 135–145)
Total Bilirubin: 0.8 mg/dL (ref 0.3–1.2)
Total Protein: 6.8 g/dL (ref 6.5–8.1)

## 2023-10-21 LAB — PROTIME-INR
INR: 1 (ref 0.8–1.2)
Prothrombin Time: 12.9 s (ref 11.4–15.2)

## 2023-10-21 LAB — ETHANOL: Alcohol, Ethyl (B): <10 mg/dL (ref <10)

## 2023-10-21 LAB — CBC
HCT: 35.2 % — ABNORMAL LOW (ref 36.0–46.0)
Hemoglobin: 11.4 g/dL — ABNORMAL LOW (ref 12.0–15.0)
MCH: 33.9 pg (ref 26.0–34.0)
MCHC: 32.4 g/dL (ref 30.0–36.0)
MCV: 104.8 fL — ABNORMAL HIGH (ref 80.0–100.0)
Platelets: 172 10*3/uL (ref 150–400)
RBC: 3.36 MIL/uL — ABNORMAL LOW (ref 3.87–5.11)
RDW: 13.3 % (ref 11.5–15.5)
WBC: 4.7 10*3/uL (ref 4.0–10.5)
nRBC: 0 % (ref 0.0–0.2)

## 2023-10-21 LAB — CBG MONITORING, ED: Glucose-Capillary: 87 mg/dL (ref 70–99)

## 2023-10-21 LAB — APTT: aPTT: 28 s (ref 24–36)

## 2023-10-21 MED ORDER — MAGNESIUM OXIDE -MG SUPPLEMENT 400 (240 MG) MG PO TABS
800.0000 mg | ORAL_TABLET | Freq: Once | ORAL | Status: AC
  Administered 2023-10-21: 800 mg via ORAL
  Filled 2023-10-21: qty 2

## 2023-10-21 MED ORDER — DIPHENHYDRAMINE HCL 50 MG/ML IJ SOLN: 25.0000 mg | Freq: Once | INTRAMUSCULAR | Status: DC

## 2023-10-21 MED ORDER — POTASSIUM CHLORIDE CRYS ER 20 MEQ PO TBCR
40.0000 meq | EXTENDED_RELEASE_TABLET | ORAL | Status: AC
  Administered 2023-10-21 (×2): 40 meq via ORAL
  Filled 2023-10-21 (×2): qty 2

## 2023-10-21 MED ORDER — PROCHLORPERAZINE EDISYLATE 10 MG/2ML IJ SOLN: 10.0000 mg | Freq: Once | INTRAMUSCULAR | Status: DC

## 2023-10-21 NOTE — ED Triage Notes (Signed)
Patient arrives via [facility] for stroke like symptoms following toenail removal at outpatient orthopaedic office. Per EMS, patient endorses difficulty swallowing, headache, tongue numbness, difficulty speaking, and shortness of breath. Patient had multiple lidocaine injections at orthopaedic office with no previous reactions. Patient endorses symptoms 30 minutes after procedure, approximately 1700. Upon EMS assessment, patient has difficulty articulating words and slightly decreased left hand grip. Patient is alert and oriented x4. Patient is not on blood thinners, denies recent falls. CBG 151. 18 G LAC.   EMS vitals BP 202/120, rechecked BP 170/108, HR 98, O2 98 on room air.

## 2023-10-21 NOTE — ED Provider Notes (Signed)
[place] EMERGENCY DEPARTMENT AT [facility] Provider Note   CSN: [id number] Arrival date & time: 10/21/23  1806  An emergency department physician performed an initial assessment on this suspected stroke patient at 32.  History  Chief Complaint  Patient presents with   Code Stroke    [name] is a 62 y.o. female.  62 yo F with a cc of acute on symptoms while at the podiatrist office reportedly.  Had tingling of the mouth and difficulty with swallowing.  Arrived as a code stroke.  Taken urgently back to CT.  Level 5 caveat acuity condition.        Home Medications Prior to Admission medications   Medication Sig Start Date End Date Taking? Authorizing Provider  albuterol (PROVENTIL) (2.5 MG/3ML) 0.083% nebulizer solution Take 3 mLs (2.5 mg total) by nebulization 2 (two) times daily as needed for wheezing or shortness of breath. 01/18/19  Yes Darlin Drop, DO  ALPRAZolam (XANAX) 0.5 MG tablet Take 0.5 mg by mouth daily as needed for anxiety. 06/11/18  Yes [provider]  atorvastatin (LIPITOR) 40 MG tablet Take 40 mg by mouth daily. 04/14/21  Yes [provider]  buPROPion (WELLBUTRIN XL) 300 MG 24 hr tablet Take 300 mg by mouth every morning.   Yes [provider]  ezetimibe (ZETIA) 10 MG tablet Take 1 tablet (10 mg total) by mouth daily. 08/28/18  Yes McCue, Shanda Bumps, NP  losartan (COZAAR) 25 MG tablet Take 25 mg by mouth daily.   Yes [provider]  metoprolol succinate (TOPROL-XL) 25 MG 24 hr tablet Take 25 mg by mouth daily.   Yes [provider]  metoprolol tartrate (LOPRESSOR) 25 MG tablet TAKE 1 TABLET BY MOUTH AS NEEDED FOR PALPITATIONS 07/03/21  Yes Crenshaw, Madolyn Frieze, MD  mupirocin ointment (BACTROBAN) 2 % Apply 1 Application topically 2 (two) times daily. 10/07/23  Yes Vivi Barrack, DPM  rosuvastatin (CRESTOR) 40 MG tablet Take 40 mg by mouth daily. 05/30/23  Yes [provider]  zolpidem  (AMBIEN) 5 MG tablet Take 5 mg by mouth at bedtime as needed for sleep.   Yes [provider]  clindamycin (CLEOCIN) 300 MG capsule Take 1 capsule (300 mg total) by mouth 3 (three) times daily. Patient not taking: Reported on 10/21/2023 10/09/23   Vivi Barrack, DPM  fluticasone [facility]) 50 MCG/ACT nasal spray Place 2 sprays into both nostrils daily as needed for rhinitis. Patient not taking: Reported on 10/07/2023 04/07/21   [provider]      Allergies    Ancef [cefazolin], Ceftin [cefuroxime], Penicillins, Gabapentin, and Other    Review of Systems   Review of Systems  Physical Exam Updated Vital Signs BP (!) 137/96   Pulse 89   Temp 98.5 F (36.9 C) (Oral)   Resp 17   Ht 5\' 4"  (1.626 m)   SpO2 97%   BMI 22.78 kg/m  Physical Exam Vitals and nursing note reviewed.  Constitutional:      General: She is not in acute distress.    Appearance: She is well-developed. She is not diaphoretic.  HENT:     Head: Normocephalic and atraumatic.  Eyes:     Pupils: Pupils are equal, round, and reactive to light.  Cardiovascular:     Rate and Rhythm: Normal rate and regular rhythm.     Heart sounds: No murmur heard.    No friction rub. No gallop.  Pulmonary:     Effort: Pulmonary  effort is normal.     Breath sounds: No wheezing or rales.  Abdominal:     General: There is no distension.     Palpations: Abdomen is soft.     Tenderness: There is no abdominal tenderness.  Musculoskeletal:        General: No tenderness.     Cervical back: Normal range of motion and neck supple.  Skin:    General: Skin is warm and dry.  Neurological:     Mental Status: She is alert and oriented to person, place, and time.  Psychiatric:        Behavior: Behavior normal.     ED Results / Procedures / Treatments   Labs (all labs ordered are listed, but only abnormal results are displayed) Labs Reviewed  CBC - Abnormal; Notable for the following components:      Result  Value   RBC 3.36 (*)    Hemoglobin 11.4 (*)    HCT 35.2 (*)    MCV 104.8 (*)    All other components within normal limits  COMPREHENSIVE METABOLIC PANEL - Abnormal; Notable for the following components:   Potassium 2.8 (*)    Creatinine, Ser 1.27 (*)    AST 107 (*)    ALT 122 (*)    GFR, Estimated 48 (*)    All other components within normal limits  I-STAT CHEM 8, ED - Abnormal; Notable for the following components:   Potassium 2.8 (*)    Creatinine, Ser 1.20 (*)    Calcium, Ion 1.10 (*)    Hemoglobin 11.9 (*)    HCT 35.0 (*)    All other components within normal limits  ETHANOL  PROTIME-INR  APTT  DIFFERENTIAL  RAPID URINE DRUG SCREEN, HOSP PERFORMED  URINALYSIS, ROUTINE W REFLEX MICROSCOPIC  CBG MONITORING, ED    EKG EKG Interpretation Date/Time:  Monday October 21 2023 19:04:35 EDT Ventricular Rate:  83 PR Interval:  181 QRS Duration:  95 QT Interval:  440 QTC Calculation: 518 R Axis:   62  Text Interpretation: Sinus rhythm Anteroseptal infarct, age indeterminate Prolonged QT interval No significant change since last tracing Confirmed by Melene Plan [phone]) on 10/21/2023 7:09:00 PM  Radiology MR BRAIN WO CONTRAST  Result Date: 10/21/2023 CLINICAL DATA:  Acute neurologic deficit EXAM: MRI HEAD WITHOUT CONTRAST TECHNIQUE: Multiplanar, multiecho pulse sequences of the brain and surrounding structures were obtained without intravenous contrast. COMPARISON:  None Available. FINDINGS: Brain: No acute infarct, mass effect or extra-axial collection. No acute or chronic hemorrhage. Normal white matter signal, parenchymal volume and CSF spaces. The midline structures are normal. Vascular: Normal flow voids. Skull and upper cervical spine: Normal calvarium and skull base. Visualized upper cervical spine and soft tissues are normal. Sinuses/Orbits:No paranasal sinus fluid levels or advanced mucosal thickening. No mastoid or middle ear effusion. Normal orbits. IMPRESSION: Normal  brain MRI. Electronically Signed   By: Deatra Robinson M.D.   On: 10/21/2023 21:22   CT HEAD CODE STROKE WO CONTRAST  Result Date: 10/21/2023 CLINICAL DATA:  Code stroke.  Neuro deficit, acute, stroke suspected EXAM: CT HEAD WITHOUT CONTRAST TECHNIQUE: Contiguous axial images were obtained from the base of the skull through the vertex without intravenous contrast. RADIATION DOSE REDUCTION: This exam was performed according to the departmental dose-optimization program which includes automated exposure control, adjustment of the mA and/or kV according to patient size and/or use of iterative reconstruction technique. COMPARISON:  CT head April 09, 2020. FINDINGS: Brain: No evidence of acute infarction, hemorrhage,  hydrocephalus, extra-axial collection or mass lesion/mass effect. Vascular: No hyperdense vessel identified. Calcific atherosclerosis. Skull: No acute fracture. Sinuses/Orbits: No acute finding. Other: No mastoid effusions. ASPECTS [facility] Stroke Program Early CT Score) Total score (0-10 with 10 being normal): 10. IMPRESSION: 1. No evidence of acute intracranial abnormality. 2. ASPECTS is 10. Code stroke imaging results were communicated on 10/21/2023 at 6:20 pm to provider Bhagat via secure text paging. Electronically Signed   By: Feliberto Harts M.D.   On: 10/21/2023 18:21    Procedures Procedures    Medications Ordered in ED Medications  potassium chloride SA (KLOR-CON M) CR tablet 40 mEq (40 mEq Oral Given 10/21/23 2105)  magnesium oxide (MAG-OX) tablet 800 mg (800 mg Oral Given 10/21/23 2105)    ED Course/ Medical Decision Making/ A&P                                 Medical Decision Making Amount and/or Complexity of Data Reviewed Labs: ordered. Radiology: ordered.   62 yo F with a chief complaints of acute onset stroke symptoms.  She was brought rapidly back to CT.  CT without intracranial hemorrhage.  Taken back to MRI.  Thought to be unlikely stroke by neurology.   Recommended waiting for formal MRI read.  Recommended headache cocktail.  I reassessed the patient and her symptoms have completely resolved.  Will hold off on a headache cocktail after discussion with her.  She is hypokalemic though not at a level I would expect to cause symptoms.  MRI is resulted and is negative for stroke.  Will discharge the patient home per plan.  Given neurology follow-up.  9:33 PM:  I have discussed the diagnosis/risks/treatment options with the patient.  Evaluation and diagnostic testing in the emergency department does not suggest an emergent condition requiring admission or immediate intervention beyond what has been performed at this time.  They will follow up with Neuro, PCP. We also discussed returning to the ED immediately if new or worsening sx occur. We discussed the sx which are most concerning (e.g., sudden worsening pain, fever, inability to tolerate by mouth) that necessitate immediate return. Medications administered to the patient during their visit and any new prescriptions provided to the patient are listed below.  Medications given during this visit Medications  potassium chloride SA (KLOR-CON M) CR tablet 40 mEq (40 mEq Oral Given 10/21/23 2105)  magnesium oxide (MAG-OX) tablet 800 mg (800 mg Oral Given 10/21/23 2105)     The patient appears reasonably screen and/or stabilized for discharge and I doubt any other medical condition or other [facility] requiring further screening, evaluation, or treatment in the ED at this time prior to discharge.          Final Clinical Impression(s) / ED Diagnoses Final diagnoses:  Difficulty with speech  Dysphagia, unspecified type    Rx / DC Orders ED Discharge Orders          Ordered    Ambulatory referral to Neurology       Comments: Transient difficulty with speech and swallowing   10/21/23 2132              Melene Plan, DO 10/21/23 2133

## 2023-10-21 NOTE — ED Notes (Signed)
Patient transported to MRI 

## 2023-10-21 NOTE — Code Documentation (Signed)
Stroke Response Nurse Documentation Code Documentation  [name] is a 62 y.o. female arriving to [facility]  via [facility] on 10/28 with past medical hx of htn, hld. On No antithrombotic. Code stroke was activated by EMS.   Patient from ortho office where she was LKW at 1652 and now complaining of headache, numb tongue, and difficulty speaking and swallowing.    Stroke team at the bedside on patient arrival. Labs drawn and patient cleared for CT by EDP. Patient to CT with team. NIHSS 0,  The following imaging was completed:  CT Head and MRI. Patient is not a candidate for IV Thrombolytic due to no stroke on MRI per MD. Patient is not a candidate for IR due to no LVO suspected per MD.   Care Plan: cancel code stroke.   Bedside handoff with ED RN Dahlia Client and [place].    Pearlie Oyster  Stroke Response RN

## 2023-10-21 NOTE — Discharge Instructions (Signed)
Your MRI did not show a stroke.  Please follow-up with a neurologist as an outpatient.  I placed an order that they should call you to try and set up an appointment.  Please return for recurrent or persistent symptoms

## 2023-10-21 NOTE — ED Notes (Signed)
Patient transported to CT 

## 2023-10-21 NOTE — Consult Note (Signed)
NEUROLOGY CONSULT NOTE   Date of service: October 21, 2023 [name] MRN:  [id number] DOB:  22-Aug-1961 Chief Complaint: "It is hard to swallow" Requesting Provider: Melene Plan, DO  History of Present Illness  [name] is a 62 y.o. female  has a past medical history of Adenomatous polyps, Anemia, Anxiety, Asthma, Blood transfusion without reported diagnosis (01/1999), Bulging discs, Endometriosis, History of atrial fibrillation, History of COVID-19, respiratory failure, Hyperlipidemia, Hypertension, PONV (postoperative nausea and vomiting), PVC (premature ventricular contraction), Stroke (HCC), and Urinary incontinence. who presents with  acute onset of tongue numbness, difficulty swallowing, and reports of difficulty speaking prior to hospital arrival.  Patient was at an outpatient appointment this afternoon for bilateral great toenail removal and received a lidocaine injection during the procedure.  Approximately 30 minutes following the procedure (at approximately 17:00), the patient began to complain of numbness of her tongue, headache, difficulty speaking, and difficulty swallowing for which EMS was called.  Of note, patient presented in 2019 with left-sided weakness and numbness and received tPA for presumed stroke without residual deficit and without infarct identified on neuroimaging; she continued to have some left-sided weakness by the time of discharge and continued to have some dysarthria/dysphagia at time of outpatient follow-up but reports that this also eventually resolved.   LKW: 16:52 Modified rankin score: 0-Completely asymptomatic and back to baseline post- stroke IV Thrombolysis:  No, no stroke on imaging, neurologic exam with low suspicion for acute stroke.  EVT:  No, presentation is not consistent with an LVO   ROS  Comprehensive ROS performed and pertinent positives documented in HPI  Past History   Past Medical History:  Diagnosis Date    Adenomatous polyps    Anemia    Anxiety    Asthma    Blood transfusion without reported diagnosis 01/1999   following hysterectomy   Bulging discs    Endometriosis    History of atrial fibrillation    History of COVID-19    12/2018, 10/2020   Hx of respiratory failure    Hyperlipidemia    Hypertension    PONV (postoperative nausea and vomiting)    PVC (premature ventricular contraction)    Stroke [facility])    Urinary incontinence    Past Surgical History:  Procedure Laterality Date   5 back surgeries     ABDOMINAL HYSTERECTOMY  2001   TAH/RSO   AUGMENTATION MAMMAPLASTY Bilateral 2013   saline. pre pectoral   BLADDER SUSPENSION  2003   LSO and rectocele repair at [facility]   BREAST SURGERY     breast augmentation--saline implants   EPIDURAL BLOCK INJECTION     HERNIA REPAIR     I & D EXTREMITY Right 06/10/2021   Procedure: IRRIGATION AND DEBRIDEMENT ACHILLES;  Surgeon: Teryl Lucy, MD;  Location: [facility] OR;  Service: Orthopedics;  Laterality: Right;   LUMBAR FUSION  2010   Dr. Channing Mutters   REDUCTION MAMMAPLASTY Bilateral    REPLACEMENT TOTAL HIP W/  RESURFACING IMPLANTS  10/2021   SPINAL FUSION  2018   Dr. Danielle Dess   SPINAL FUSION  04/06/2020   SPINE SURGERY     Family History: Family History  Problem Relation Age of Onset   Hypertension Mother    Stroke Mother    Hypertension Father    Cancer Father    Dementia Father    Hyperlipidemia Sister    Hypertension Sister    Hyperlipidemia Brother    Hypertension Brother    Hypertension Brother  Hypertension Brother    Cancer Maternal Uncle 24       colon ca   Stroke Maternal Uncle    Breast cancer Maternal Grandmother    Cancer Maternal Grandmother 59       colon ca   Stroke Maternal Aunt    Stroke Paternal Grandfather    Social History  reports that she has quit smoking. Her smoking use included cigarettes. She has never used smokeless tobacco. She reports current alcohol use of about 6.0 - 8.0 standard drinks of alcohol  per week. She reports that she does not currently use drugs.  Allergies  Allergen Reactions   Ancef [Cefazolin] Hives    Cause rash shortness of breath and vomiting    Ceftin [Cefuroxime] Nausea And Vomiting   Penicillins Hives and Other (See Comments)    Has patient had a PCN reaction causing immediate rash, facial/tongue/throat swelling, SOB or lightheadedness with hypotension: Yes Has patient had a PCN reaction causing severe rash involving mucus membranes or skin necrosis:Yes Has patient had a PCN reaction that required hospitalization:No Has patient had a PCN reaction occurring within the last 10 years: No If all of the above answers are "NO", then may proceed with Cephalosporin use.    Gabapentin    Other Other (See Comments)    Medications  No current facility-administered medications for this encounter.  Current Outpatient Medications:    acetaminophen (TYLENOL) 325 MG tablet, Take 2 tablets (650 mg total) by mouth every 6 (six) hours as needed., Disp: 60 tablet, Rfl: 1   albuterol (PROVENTIL) (2.5 MG/3ML) 0.083% nebulizer solution, Take 3 mLs (2.5 mg total) by nebulization 2 (two) times daily as needed for wheezing or shortness of breath., Disp: 75 mL, Rfl: 0   ALPRAZolam (XANAX) 0.5 MG tablet, Take 0.5 mg by mouth daily as needed for anxiety. for anxiety, Disp: , Rfl: 1   atorvastatin (LIPITOR) 40 MG tablet, Take 40 mg by mouth daily. (Patient not taking: Reported on 10/07/2023), Disp: , Rfl:    benzonatate (TESSALON) 100 MG capsule, Take 1 capsule (100 mg total) by mouth every 8 (eight) hours. (Patient not taking: Reported on 10/07/2023), Disp: 21 capsule, Rfl: 0   buPROPion (WELLBUTRIN XL) 300 MG 24 hr tablet, Take 300 mg by mouth every morning., Disp: , Rfl:    buPROPion (ZYBAN) 150 MG 12 hr tablet, Take 150 mg by mouth daily., Disp: , Rfl:    clindamycin (CLEOCIN) 300 MG capsule, Take 1 capsule (300 mg total) by mouth 3 (three) times daily., Disp: 21 capsule, Rfl: 0    ezetimibe (ZETIA) 10 MG tablet, Take 1 tablet (10 mg total) by mouth daily., Disp: 30 tablet, Rfl: 4   fluticasone (FLONASE) 50 MCG/ACT nasal spray, Place 2 sprays into both nostrils daily as needed for rhinitis. (Patient not taking: Reported on 10/07/2023), Disp: , Rfl:    losartan (COZAAR) 100 MG tablet, Take 100 mg by mouth daily.  (Patient not taking: Reported on 10/07/2023), Disp: , Rfl:    metoprolol succinate (TOPROL-XL) 25 MG 24 hr tablet, Take 25 mg by mouth daily., Disp: , Rfl:    metoprolol tartrate (LOPRESSOR) 25 MG tablet, TAKE 1 TABLET BY MOUTH AS NEEDED FOR PALPITATIONS, Disp: 30 tablet, Rfl: 0   mupirocin ointment (BACTROBAN) 2 %, Apply 1 Application topically 2 (two) times daily., Disp: 30 g, Rfl: 2   oxyCODONE (ROXICODONE) 5 MG immediate release tablet, Take 1 tablet (5 mg total) by mouth every 6 (six) hours as needed for  severe pain. (Patient not taking: Reported on 10/07/2023), Disp: 5 tablet, Rfl: 0   rosuvastatin (CRESTOR) 40 MG tablet, Take 40 mg by mouth at bedtime., Disp: , Rfl:    triamterene-hydrochlorothiazide (DYAZIDE) 37.5-25 MG capsule, Take 1 capsule by mouth daily. (Patient not taking: Reported on 10/07/2023), Disp: , Rfl:    zolpidem (AMBIEN) 10 MG tablet, Take 5 mg by mouth at bedtime as needed for sleep., Disp: , Rfl:   Vitals   Vitals:   10/21/23 1821 10/21/23 1855  BP: (!) 158/76   Height:  5\' 4"  (1.626 m)    Body mass index is 22.78 kg/m.  Physical Exam   Constitutional: Appears well-developed and well-nourished.  Psych: Patient appears anxious on exam but is cooperative throughout Eyes: No scleral injection.  HENT: No OP obstruction.  Head: Normocephalic and atraumatic Cardiovascular: Normal rate and regular rhythm.  Respiratory: Effort normal, non-labored breathing on room air  GI: Soft.  No distension. There is no tenderness. Skin: WDI.   Neurologic Examination   Mental Status: Patient is awake, alert, oriented to person, place, month,  year, and situation. Patient is able to give a clear and coherent history. No signs of aphasia or neglect.  Speech is mildly stuttering and at times delayed latency. Naming, repetition, and comprehension are otherwise intact Cranial Nerves: II: Visual Fields are full. Pupils are equal, round, and reactive to light.   III,IV, VI: EOMI without ptosis or diploplia.  V: Facial sensation is intact and symmetric to light touch VII: Facial movement is symmetric resting and with movement VIII: Hearing is intact to voice X: Palate elevates symmetrically XI: Shoulder shrug is symmetric. XII: Tongue protrudes midline   Motor: Tone is normal. Bulk is normal.  No drift in bilateral upper or lower extremities Patient does have shivering throughout but she complains that she is very cold.  Sensory: Sensation is symmetric to light touch and temperature in the arms and legs. No extinction to DSS present.  Cerebellar: FNF and HKS are intact without evidence of ataxia on exam Gait: Deferred in the acute setting   Labs    Basic Metabolic Panel: Recent Labs  Lab 10/21/23 1808 10/21/23 1813  NA 139 140  K 2.8* 2.8*  CL 99 99  CO2 25  --   GLUCOSE 94 93  BUN 19 22  CREATININE 1.27* 1.20*  CALCIUM 9.5  --     CBC: Recent Labs  Lab 10/21/23 1808 10/21/23 1813  WBC 4.7  --   NEUTROABS 2.8  --   HGB 11.4* 11.9*  HCT 35.2* 35.0*  MCV 104.8*  --   PLT 172  --     Coagulation Studies: Recent Labs    10/21/23 1808  LABPROT 12.9  INR 1.0      Lipid Panel:  Lab Results  Component Value Date   LDLCALC 82 06/19/2018   HgbA1c:  Lab Results  Component Value Date   HGBA1C 5.5 01/17/2019   Urine Drug Screen:     Component Value Date/Time   LABOPIA NONE DETECTED 04/09/2020 1343   COCAINSCRNUR NONE DETECTED 04/09/2020 1343   LABBENZ POSITIVE (A) 04/09/2020 1343   AMPHETMU NONE DETECTED 04/09/2020 1343   THCU NONE DETECTED 04/09/2020 1343   LABBARB NONE DETECTED 04/09/2020 1343     Alcohol Level     Component Value Date/Time   ETH <10 04/09/2020 1343   INR  Lab Results  Component Value Date   INR 0.91 06/18/2018   APTT  Lab Results  Component Value Date   APTT 27 06/18/2018   AED levels: No results found for: "PHENYTOIN", "ZONISAMIDE", "LAMOTRIGINE", "LEVETIRACETA"   CT Head without contrast(Personally reviewed): 1. No evidence of acute intracranial abnormality. 2. ASPECTS is 10.  MRI Brain(Personally reviewed): No evidence of acute infarction on personal review, formal impression pending  Impression   CHRISTIONA KRAM is a 62 y.o. female with a medical history significant for anxiety, hyperlipidemia, hypertension, presumed stroke presentation 2019 with left-sided weakness and numbness s/p tPA without residual deficit or imaging evidence of ischemia, and urinary incontinence who presented to the ED 10/21/23 via EMS from an outpatient appointment for bilateral great toenail removal.  Patient received a lidocaine injection prior to toenail removal approximately 30 minutes after the procedure, patient complaining of acute tongue numbness, difficulties with, and difficulty swallowing.  On arrival, patient complained "it is hard to swallow" and was shivering with complaints of feeling cold but no focal neurologic deficits were identified.  Differential diagnoses for presentation include complex migraine headache versus functional neurologic disorder.   Recommendations  - MRI was obtained on an emergent basis to confirm no acute intracranial process leading to her speech difficulty - CTA not obtained emergently given risks of contrast and exam overall inconsistent with LVO - Headache cocktail per ED - Please await formal radiology report to confirm negative MRI.  If imaging is reassuring she may have further follow-up on an outpatient basis with neurology ______________________________________________________________________  Dema Severin,  AGACNP-BC [facility] Pager: [phone]  Attending Neurologist's note:  I personally saw this patient, gathering history, performing a full neurologic examination, reviewing relevant labs, personally reviewing relevant imaging including head CT and MRI brain, and formulated the assessment and plan, adding the note above for completeness and clarity to accurately reflect my thoughts  CRITICAL CARE Performed by: Gordy Councilman   Total critical care time: 35 minutes  Critical care time was exclusive of separately billable procedures and treating other patients.  Critical care was necessary to treat or prevent imminent or life-threatening deterioration; emergent evaluation for consideration of thrombolytic or thrombectomy.  Critical care was time spent personally by me on the following activities: development of treatment plan with patient and/or surrogate as well as nursing, discussions with consultants, evaluation of patient's response to treatment, examination of patient, obtaining history from patient or surrogate, ordering and performing treatments and interventions, ordering and review of laboratory studies, ordering and review of radiographic studies, pulse oximetry and re-evaluation of patient's condition.

## 2023-10-24 ENCOUNTER — Institutional Professional Consult (permissible substitution): Payer: BC Managed Care – PPO | Admitting: Neurology

## 2023-10-24 ENCOUNTER — Encounter: Payer: Self-pay | Admitting: Neurology

## 2023-11-28 ENCOUNTER — Ambulatory Visit (INDEPENDENT_AMBULATORY_CARE_PROVIDER_SITE_OTHER): Payer: BC Managed Care – PPO | Admitting: Podiatry

## 2023-11-28 ENCOUNTER — Encounter: Payer: Self-pay | Admitting: Podiatry

## 2023-11-28 VITALS — Ht 64.0 in | Wt 132.0 lb

## 2023-11-28 DIAGNOSIS — L600 Ingrowing nail: Secondary | ICD-10-CM

## 2023-11-28 NOTE — Progress Notes (Signed)
Subjective:   Patient ID: [name], female   DOB: 62 y.o.   MRN: [id number]   HPI Patient presents stating that she has had trouble with the nails and ultimately went to an orthopedic doctor had the nails removed and they seem better now but she wants this checked and she did develop some form of possible toxicity to the anesthetic from a high dosage used at the orthopedic office   ROS      Objective:  Physical Exam  Neurovascular status intact hallux nailbeds currently look good very mild redness left over right localized crusted tissue that will hopefully form normal nail growth but will take approximate 6 months with no other current pathology     Assessment:  Probability that she may have had some kind of a reaction but I am doubtful of a toxic reaction given the fact this is not generally an intravascular type injection.  Nailbeds look okay but may not grow out normally but will not know for extended period of time     Plan:  Reviewed this at great length and at this point I dispensed silicone cushions for the toes so she can get back into different shoes and eventually she should be able to wear everything.  Discussed this all with her and patient will be seen back as needed

## 2024-04-21 ENCOUNTER — Other Ambulatory Visit: Payer: Self-pay | Admitting: Obstetrics and Gynecology

## 2024-04-21 DIAGNOSIS — Z1231 Encounter for screening mammogram for malignant neoplasm of breast: Secondary | ICD-10-CM

## 2024-05-01 ENCOUNTER — Ambulatory Visit
Admission: RE | Admit: 2024-05-01 | Discharge: 2024-05-01 | Disposition: A | Discharge status: Home / Self Care | Source: Ambulatory Visit | Attending: Obstetrics and Gynecology | Admitting: Obstetrics and Gynecology

## 2024-05-01 DIAGNOSIS — Z1231 Encounter for screening mammogram for malignant neoplasm of breast: Secondary | ICD-10-CM

## 2024-05-05 ENCOUNTER — Ambulatory Visit: Payer: Self-pay | Admitting: Obstetrics and Gynecology

## 2024-05-05 ENCOUNTER — Ambulatory Visit (INDEPENDENT_AMBULATORY_CARE_PROVIDER_SITE_OTHER): Payer: Self-pay | Admitting: Family Medicine

## 2024-05-05 ENCOUNTER — Encounter: Payer: Self-pay | Admitting: Family Medicine

## 2024-05-05 DIAGNOSIS — S76302A Unspecified injury of muscle, fascia and tendon of the posterior muscle group at thigh level, left thigh, initial encounter: Secondary | ICD-10-CM

## 2024-05-05 NOTE — Progress Notes (Signed)
 FOLLOW-UP VISIT:  Shockwave Therapy Procedure   NAME:  [name] DOB:  Sep 03, 1961 MR#: [id number] DATE OF VISIT:  05/05/2024  Encounter:   [name] comes in for initiation of Lt proximal hamstring  Radial Extracorporeal Shockwave therapy (ESWT) treatment to chronic Lt hamstring pain Referred by Dr Constancia Delton at [facility] Pain started following fall after THA with Dr Carry Clapper over 2 years ago No improvement with PT, (2) PRP injections, (2) cortisone injections No prior shockwave  MRI Lt hip 02/22/23 showing: IMPRESSION: 1. Mild marrow edema in the posterior left acetabulum adjacent to the acetabular component which may reflect mild stress reaction versus poor fat saturation. No acute fracture. 2. Mild-moderate osteoarthritis of the right hip. 3. 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 4. 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    ESWT Procedure Note: Treatment #1  Diagnosis: Lt proximal hamstring pain  Procedure Details  Consent for the procedure was obtained. Time out was performed. The anatomical landmarks and target areas for the procedure were identified.   PROCEDURE: ESWT was discussed with the patient in detail.  The risk and benefits were explained.  The point of maximal tenderness was identified and the oscillator probe was applied with moderate pressure. Treatment adjusted as mediated by patient feedback/pain.  Lt ischial tuberosity and surrounding tissue was targeted for ESWT  Preset: Post-muscle injury Power level: 80 MJ Frequency: 10 Hz Impulses: 2000 Head size: large   Complications:  None; patient tolerated the procedure well.  ASSESSMENT/PLAN 1. Left hamstring injury, initial encounter Chronic, high-grade tear of left hamstring origin s/p fall 2 years ago, limited improvement with prior PRP x 2 and cortisone injection x 2  Plan:   ESWT completed  today as noted above Return in 1 week for ESWT procedure - anticipate total of 4-6 sessions depending on her progress Procedure aftercare reviewed Recommended avoiding strenuous activities and using OTC analgesia prn.

## 2024-05-05 NOTE — Patient Instructions (Signed)

## 2024-05-14 ENCOUNTER — Ambulatory Visit: Payer: Self-pay | Admitting: Family Medicine

## 2024-05-19 ENCOUNTER — Encounter: Payer: Self-pay | Admitting: Family Medicine

## 2024-05-19 ENCOUNTER — Ambulatory Visit: Admitting: Family Medicine

## 2024-05-19 ENCOUNTER — Ambulatory Visit (INDEPENDENT_AMBULATORY_CARE_PROVIDER_SITE_OTHER): Payer: Self-pay | Admitting: Family Medicine

## 2024-05-19 DIAGNOSIS — S76302D Unspecified injury of muscle, fascia and tendon of the posterior muscle group at thigh level, left thigh, subsequent encounter: Secondary | ICD-10-CM

## 2024-05-19 NOTE — Patient Instructions (Signed)

## 2024-05-19 NOTE — Progress Notes (Signed)
 FOLLOW-UP VISIT:  Shockwave Therapy Procedure   NAME:  [name] DOB:  [birth year] MR#: [id number] DATE OF VISIT:  05/19/2024  Encounter:   [name] comes in for a follow up evaluation and 2nd Radial Extracorporeal Shockwave therapy (ESWT) treatment to the  Lt proximal hamstring.  [name] reports minimal interval change in symptoms after 1st treatment - felt great for several hours after, then had PT session - symptoms returned to baseline the rest of the time She has been active with ongoing PT twice a week - has PT session this afternoon    ESWT Procedure Note: Treatment #2  Diagnosis: Lt proximal hamstring pain   Procedure Details  Consent for the procedure was obtained. Time out was performed. The anatomical landmarks and target areas for the procedure were identified.   PROCEDURE: ESWT was discussed with the patient in detail.  The risk and benefits were explained.  The point of maximal tenderness was identified and the oscillator probe was applied with moderate pressure. Treatment adjusted as mediated by patient feedback/pain.  Lt ischial tuberosity and surrounding tissue  was targeted for ESWT  Preset: Post-muscle injury Power level: 90 MJ Frequency: 10 Hz Impulses: 2500 Head size: large   Complications:  None; patient tolerated the procedure well.  ASSESSMENT/PLAN: 1. Left hamstring injury, subsequent encounter   Plan:   ESWT completed today as noted above Return in 1 week for ESWT procedure #3 as scheduled Procedure aftercare reviewed Recommended avoiding strenuous activities and using OTC analgesia prn.

## 2024-05-22 ENCOUNTER — Emergency Department (HOSPITAL_BASED_OUTPATIENT_CLINIC_OR_DEPARTMENT_OTHER)

## 2024-05-22 ENCOUNTER — Other Ambulatory Visit: Payer: Self-pay

## 2024-05-22 ENCOUNTER — Emergency Department (HOSPITAL_BASED_OUTPATIENT_CLINIC_OR_DEPARTMENT_OTHER)
Admission: EM | Admit: 2024-05-22 | Discharge: 2024-05-22 | Disposition: A | Discharge status: Home / Self Care | Attending: Emergency Medicine | Admitting: Emergency Medicine

## 2024-05-22 DIAGNOSIS — R1013 Epigastric pain: Secondary | ICD-10-CM | POA: Insufficient documentation

## 2024-05-22 DIAGNOSIS — R109 Unspecified abdominal pain: Secondary | ICD-10-CM

## 2024-05-22 LAB — COMPREHENSIVE METABOLIC PANEL WITH GFR
ALT: 89 U/L — ABNORMAL HIGH (ref 0–44)
AST: 67 U/L — ABNORMAL HIGH (ref 15–41)
Albumin: 4.7 g/dL (ref 3.5–5.0)
Alkaline Phosphatase: 91 U/L (ref 38–126)
Anion gap: 17 — ABNORMAL HIGH (ref 5–15)
BUN: 38 mg/dL — ABNORMAL HIGH (ref 8–23)
CO2: 25 mmol/L (ref 22–32)
Calcium: 10.4 mg/dL — ABNORMAL HIGH (ref 8.9–10.3)
Chloride: 95 mmol/L — ABNORMAL LOW (ref 98–111)
Creatinine, Ser: 1.05 mg/dL — ABNORMAL HIGH (ref 0.44–1.00)
GFR, Estimated: 59 mL/min — ABNORMAL LOW (ref >60)
Glucose, Bld: 86 mg/dL (ref 70–99)
Potassium: 3.9 mmol/L (ref 3.5–5.1)
Sodium: 137 mmol/L (ref 135–145)
Total Bilirubin: 0.6 mg/dL (ref 0.0–1.2)
Total Protein: 8.1 g/dL (ref 6.5–8.1)

## 2024-05-22 LAB — CBC WITH DIFFERENTIAL/PLATELET
Abs Immature Granulocytes: 0.02 10*3/uL (ref 0.00–0.07)
Basophils Absolute: 0 10*3/uL (ref 0.0–0.1)
Basophils Relative: 1 %
Eosinophils Absolute: 0.1 10*3/uL (ref 0.0–0.5)
Eosinophils Relative: 1 %
HCT: 36.7 % (ref 36.0–46.0)
Hemoglobin: 12.2 g/dL (ref 12.0–15.0)
Immature Granulocytes: 0 %
Lymphocytes Relative: 19 %
Lymphs Abs: 1.2 10*3/uL (ref 0.7–4.0)
MCH: 34.7 pg — ABNORMAL HIGH (ref 26.0–34.0)
MCHC: 33.2 g/dL (ref 30.0–36.0)
MCV: 104.3 fL — ABNORMAL HIGH (ref 80.0–100.0)
Monocytes Absolute: 0.6 10*3/uL (ref 0.1–1.0)
Monocytes Relative: 10 %
Neutro Abs: 4.3 10*3/uL (ref 1.7–7.7)
Neutrophils Relative %: 69 %
Platelets: 190 10*3/uL (ref 150–400)
RBC: 3.52 MIL/uL — ABNORMAL LOW (ref 3.87–5.11)
RDW: 12.3 % (ref 11.5–15.5)
WBC: 6.2 10*3/uL (ref 4.0–10.5)
nRBC: 0 % (ref 0.0–0.2)

## 2024-05-22 LAB — TROPONIN T, HIGH SENSITIVITY: Troponin T High Sensitivity: <15 ng/L (ref <19)

## 2024-05-22 LAB — LIPASE, BLOOD: Lipase: 279 U/L — ABNORMAL HIGH (ref 11–51)

## 2024-05-22 MED ORDER — ONDANSETRON 8 MG PO TBDP: 8.0000 mg | ORAL_TABLET | Freq: Three times a day (TID) | ORAL | 0 refills | Status: DC | PRN

## 2024-05-22 MED ORDER — IOHEXOL 300 MG/ML  SOLN
100.0000 mL | Freq: Once | INTRAMUSCULAR | Status: AC | PRN
  Administered 2024-05-22: 100 mL via INTRAVENOUS

## 2024-05-22 MED ORDER — LABETALOL HCL 5 MG/ML IV SOLN
10.0000 mg | Freq: Once | INTRAVENOUS | Status: AC
  Administered 2024-05-22: 10 mg via INTRAVENOUS
  Filled 2024-05-22: qty 4

## 2024-05-22 NOTE — Discharge Instructions (Addendum)
 Use a clear liquid diet for the next 2 days.  Return to the hospital if you develop severe abdominal pain, fever, vomiting.

## 2024-05-22 NOTE — ED Provider Notes (Signed)
 [place] EMERGENCY DEPARTMENT AT [facility] Provider Note   CSN: [id number] Arrival date & time: 05/22/24  1009     History  No chief complaint on file.   [name] is a 63 y.o. female.  63 year old female who presents with epigastric pain which began today at work.  Describes it as a muscle type of contraction went across her chest.  States she was diaphoretic when she had this.  Denies any shortness of breath.  No nausea or vomiting.  Pain did not radiate to her back.  Her blood pressure was elevated and she took an extra dose of her losartan .  States that the discomfort lasted for about 40 minutes and has since resolved.  Endorses a mild headache without visual changes.  Denies any focal neurological weakness.       Home Medications Prior to Admission medications   Medication Sig Start Date End Date Taking? Authorizing Provider  albuterol  (PROVENTIL ) (2.5 MG/3ML) 0.083% nebulizer solution Take 3 mLs (2.5 mg total) by nebulization 2 (two) times daily as needed for wheezing or shortness of breath. 01/18/19   Bary Boss, DO  ALPRAZolam  (XANAX ) 0.5 MG tablet Take 0.5 mg by mouth daily as needed for anxiety. 06/11/18   [provider]  atorvastatin (LIPITOR) 40 MG tablet Take 40 mg by mouth daily. 04/14/21   [provider]  buPROPion  (WELLBUTRIN  XL) 300 MG 24 hr tablet Take 300 mg by mouth every morning.    [provider]  clindamycin  (CLEOCIN ) 300 MG capsule Take 1 capsule (300 mg total) by mouth 3 (three) times daily. 10/09/23   Charity Conch, DPM  ezetimibe  (ZETIA ) 10 MG tablet Take 1 tablet (10 mg total) by mouth daily. 08/28/18   Johny Nap, NP  fluticasone (FLONASE) 50 MCG/ACT nasal spray Place 2 sprays into both nostrils daily as needed for rhinitis. 04/07/21   [provider]  losartan  (COZAAR ) 25 MG tablet Take 25 mg by mouth daily.    [provider]  metoprolol  succinate (TOPROL -XL) 25 MG 24 hr tablet  Take 25 mg by mouth daily.    [provider]  metoprolol  tartrate (LOPRESSOR ) 25 MG tablet TAKE 1 TABLET BY MOUTH AS NEEDED FOR PALPITATIONS 07/03/21   Lenise Quince, MD  mupirocin  ointment (BACTROBAN ) 2 % Apply 1 Application topically 2 (two) times daily. 10/07/23   Charity Conch, DPM  rosuvastatin  (CRESTOR ) 40 MG tablet Take 40 mg by mouth daily. 05/30/23   [provider]  zolpidem  (AMBIEN ) 5 MG tablet Take 5 mg by mouth at bedtime as needed for sleep.    [provider]      Allergies    Ancef  [cefazolin ], Ceftin [cefuroxime], Penicillins, Gabapentin , and Other    Review of Systems   Review of Systems  All other systems reviewed and are negative.   Physical Exam Updated Vital Signs There were no vitals taken for this visit. Physical Exam Vitals and nursing note reviewed.  Constitutional:      General: She is not in acute distress.    Appearance: Normal appearance. She is well-developed. She is not toxic-appearing.  HENT:     Head: Normocephalic and atraumatic.  Eyes:     General: Lids are normal.     Conjunctiva/sclera: Conjunctivae normal.     Pupils: Pupils are equal, round, and reactive to light.  Neck:     Thyroid : No thyroid  mass.     Trachea: No tracheal deviation.  Cardiovascular:  Rate and Rhythm: Normal rate and regular rhythm.     Heart sounds: Normal heart sounds. No murmur heard.    No gallop.  Pulmonary:     Effort: Pulmonary effort is normal. No respiratory distress.     Breath sounds: Normal breath sounds. No stridor. No decreased breath sounds, wheezing, rhonchi or rales.  Abdominal:     General: There is no distension.     Palpations: Abdomen is soft.     Tenderness: There is no abdominal tenderness. There is no rebound.  Musculoskeletal:        General: No tenderness. Normal range of motion.     Cervical back: Normal range of motion and neck supple.  Skin:    General: Skin is warm and dry.     Findings: No  abrasion or rash.  Neurological:     Mental Status: She is alert and oriented to person, place, and time. Mental status is at baseline.     GCS: GCS eye subscore is 4. GCS verbal subscore is 5. GCS motor subscore is 6.     Cranial Nerves: No cranial nerve deficit.     Sensory: No sensory deficit.     Motor: Motor function is intact.  Psychiatric:        Attention and Perception: Attention normal.        Speech: Speech normal.        Behavior: Behavior normal.     ED Results / Procedures / Treatments   Labs (all labs ordered are listed, but only abnormal results are displayed) Labs Reviewed  CBC WITH DIFFERENTIAL/PLATELET  COMPREHENSIVE METABOLIC PANEL WITH GFR  LIPASE, BLOOD  TROPONIN T, HIGH SENSITIVITY    EKG EKG Interpretation Date/Time:  Friday May 22 2024 10:22:19 EDT Ventricular Rate:  74 PR Interval:  159 QRS Duration:  91 QT Interval:  422 QTC Calculation: 469 R Axis:   82  Text Interpretation: Sinus rhythm Anterior infarct, old Confirmed by Lind Repine ([id number]) on 05/22/2024 10:29:55 AM  Radiology No results found.  Procedures Procedures    Medications Ordered in ED Medications  labetalol (NORMODYNE) injection 10 mg (has no administration in time range)    ED Course/ Medical Decision Making/ A&P                                 Medical Decision Making Amount and/or Complexity of Data Reviewed Labs: ordered. Radiology: ordered.  Risk Prescription drug management.   Patient's EKG shows normal sinus rhythm.  No ischemic changes noted.  Labs significant for elevated lipase of 279 along with mild elevation of patient's transaminases.  She did have some epigastric discomfort which brought her in.  Concern for possible pancreatitis and abdominal CT performed which showed no acute abnormality.  Patient's blood pressure treated with Lopressor  and has improved.  Will prescribe patient have liquid diet for the next 2 days.  Return precautions given patient  will follow-up with her doctor        Final Clinical Impression(s) / ED Diagnoses Final diagnoses:  None    Rx / DC Orders ED Discharge Orders     None         Lind Repine, MD 05/22/24 1448

## 2024-05-22 NOTE — ED Triage Notes (Addendum)
 In for eval of headache, lightheadedness, elevated BP, and tightness in chest today. BP 220/110. Compliant with HTN medications, took losartan  75mg , propanolol 80 mg and 0.5 mg xanax  today.

## 2024-05-22 NOTE — ED Notes (Signed)
 Prev stroke - left arm/leg weakness normal... No new obvious different symptoms.Becky AasAaron Schroeder

## 2024-05-22 NOTE — ED Notes (Signed)
 DC paperwork given and verbally understood.

## 2024-05-22 NOTE — ED Notes (Signed)
 Manual BP taken and documented in patient chart for left and right arm with small adult cuff. Regular adult cuff was not suitable for PT size.

## 2024-05-28 ENCOUNTER — Ambulatory Visit (INDEPENDENT_AMBULATORY_CARE_PROVIDER_SITE_OTHER): Payer: Self-pay | Admitting: Family Medicine

## 2024-05-28 ENCOUNTER — Encounter: Payer: Self-pay | Admitting: Family Medicine

## 2024-05-28 DIAGNOSIS — S76302D Unspecified injury of muscle, fascia and tendon of the posterior muscle group at thigh level, left thigh, subsequent encounter: Secondary | ICD-10-CM

## 2024-05-28 NOTE — Patient Instructions (Signed)

## 2024-05-28 NOTE — Progress Notes (Signed)
 FOLLOW-UP VISIT:  Shockwave Therapy Procedure   NAME:  [name] DOB:  04/01/1961 MR#: [id number] DATE OF VISIT:  05/28/2024  Encounter:   [name] comes in for a follow up evaluation and 3rd Radial Extracorporeal Shockwave therapy (ESWT) treatment to the  Lt proximal hamstring.  [name] reports some interval change in symptoms - after 2nd treatment felt a lot better later in the day.  The first time she has started to feel even a short period of relief in some time Still doing PT twice a week - has session at 2pm today  ESWT Procedure Note: Treatment #3  Diagnosis: Lt proximal hamstring pain   Procedure Details  Consent for the procedure was obtained. Time out was performed. The anatomical landmarks and target areas for the procedure were identified.   PROCEDURE: ESWT was discussed with the patient in detail.  The risk and benefits were explained.  The point of maximal tenderness was identified and the oscillator probe was applied with moderate pressure. Treatment adjusted as mediated by patient feedback/pain.  Lt ischial tuberosity and surrounding tissue  was targeted for ESWT  Preset: post-muscle injury Power level: 120 MJ Frequency: 10 Hz Impulses: 3000 Head size: large   Complications:  None; patient tolerated the procedure well.  ASSESSMENT/PLAN: 1. Left hamstring injury, subsequent encounter  Plan:   ESWT completed today as noted above.  She noted felt really good after treatment today Return in 1 week for ESWT procedure #4 Procedure aftercare reviewed Recommended avoiding strenuous activities and using OTC analgesia prn.

## 2024-06-02 ENCOUNTER — Ambulatory Visit (INDEPENDENT_AMBULATORY_CARE_PROVIDER_SITE_OTHER): Payer: Self-pay | Admitting: Family Medicine

## 2024-06-02 ENCOUNTER — Encounter: Payer: Self-pay | Admitting: Family Medicine

## 2024-06-02 DIAGNOSIS — S76302D Unspecified injury of muscle, fascia and tendon of the posterior muscle group at thigh level, left thigh, subsequent encounter: Secondary | ICD-10-CM

## 2024-06-02 NOTE — Progress Notes (Signed)
 FOLLOW-UP VISIT:  Shockwave Therapy Procedure   NAME:  [name] DOB:  [birth year] MR#: [id number] DATE OF VISIT:  06/02/2024  Encounter:   [name] comes in for a follow up evaluation and 4th Radial Extracorporeal Shockwave therapy (ESWT) treatment to the  left proximal hamstring.  [name] reports has been helping - relief from last session wore off after about 3-4 days, but getting longer relief after treatments - able to do more in PT Has continued with PT   ESWT Procedure Note: Treatment #4  Diagnosis: Lt proximal hamstring pain  Procedure Details  Consent for the procedure was obtained. Time out was performed. The anatomical landmarks and target areas for the procedure were identified.   PROCEDURE: ESWT was discussed with the patient in detail.  The risk and benefits were explained.  The point of maximal tenderness was identified and the oscillator probe was applied with moderate pressure. Treatment adjusted as mediated by patient feedback/pain.  Lt ischial tuberosity and surrounding tissue  was targeted for ESWT  Preset: post-muscle injury Power level: 120 MJ Frequency: 12 Hz Impulses: 3000 Head size: large   Complications:  None; patient tolerated the procedure well.  ASSESSMENT/PLAN: 1. Left hamstring injury, subsequent encounter  Plan:   ESWT completed today as noted above Return in about 1 week for ESWT procedure #5 -- due to work will likely need to do next session in 10-14 days Procedure aftercare reviewed Recommended avoiding strenuous activities and using OTC analgesia prn.

## 2024-06-02 NOTE — Patient Instructions (Signed)

## 2024-06-11 ENCOUNTER — Ambulatory Visit: Payer: Self-pay | Admitting: Family Medicine

## 2024-06-11 ENCOUNTER — Encounter: Payer: Self-pay | Admitting: Family Medicine

## 2024-06-11 ENCOUNTER — Ambulatory Visit (INDEPENDENT_AMBULATORY_CARE_PROVIDER_SITE_OTHER): Payer: Self-pay | Admitting: Family Medicine

## 2024-06-11 DIAGNOSIS — S76302D Unspecified injury of muscle, fascia and tendon of the posterior muscle group at thigh level, left thigh, subsequent encounter: Secondary | ICD-10-CM

## 2024-06-11 NOTE — Progress Notes (Signed)
 FOLLOW-UP VISIT:  Shockwave Therapy Procedure   NAME:  [name] DOB:  [birth year] MR#: [id number] DATE OF VISIT:  06/11/2024  Encounter:   [name] comes in for a follow up evaluation and 5th Radial Extracorporeal Shockwave therapy (ESWT) treatment to the  Lt proximal hamstring.  [name] reports treatments still giving some relief - had a hard week at work, so having increased discomfort today - each session still lasting 3-4 days Has continued with PT - has session later today   ESWT Procedure Note: Treatment #5  Diagnosis: Lt proximal hamstring pain   Procedure Details  Consent for the procedure was obtained. Time out was performed. The anatomical landmarks and target areas for the procedure were identified.   PROCEDURE: ESWT was discussed with the patient in detail.  The risk and benefits were explained.  The point of maximal tenderness was identified and the oscillator probe was applied with moderate pressure. Treatment adjusted as mediated by patient feedback/pain.  Lt ischial tuberosity and surrounding tissue  was targeted for ESWT  Preset: post muscle injury Power level: 120 MJ Frequency: 14 Hz Impulses: 3000 Head size: large   Complications:  None; patient tolerated the procedure well.  ASSESSMENT/PLAN 1. Left hamstring injury, subsequent encounter  Plan:   ESWT completed today as noted above Return in 1 week for ESWT procedure #6.  After that treatment consider spacing out treatments every 2 weeks for total of 8 sessions.  Procedure aftercare reviewed Recommended avoiding strenuous activities and using OTC analgesia prn.  Ok to try easing into Pickle ball.

## 2024-06-11 NOTE — Patient Instructions (Signed)

## 2024-06-18 ENCOUNTER — Ambulatory Visit: Admitting: Family Medicine

## 2024-06-24 ENCOUNTER — Ambulatory Visit: Payer: Self-pay | Admitting: Family Medicine

## 2024-09-12 DIAGNOSIS — G5601 Carpal tunnel syndrome, right upper limb: Secondary | ICD-10-CM | POA: Insufficient documentation

## 2024-09-14 ENCOUNTER — Other Ambulatory Visit: Payer: Self-pay | Admitting: Family Medicine

## 2024-09-14 DIAGNOSIS — R471 Dysarthria and anarthria: Secondary | ICD-10-CM

## 2024-09-14 DIAGNOSIS — R413 Other amnesia: Secondary | ICD-10-CM

## 2024-09-14 DIAGNOSIS — R4702 Dysphasia: Secondary | ICD-10-CM

## 2024-09-15 NOTE — Progress Notes (Signed)
Clinical support visit.

## 2024-09-22 ENCOUNTER — Ambulatory Visit: Payer: Self-pay | Admitting: Family Medicine

## 2024-09-26 ENCOUNTER — Encounter (HOSPITAL_BASED_OUTPATIENT_CLINIC_OR_DEPARTMENT_OTHER): Payer: Self-pay

## 2024-09-26 ENCOUNTER — Emergency Department (HOSPITAL_BASED_OUTPATIENT_CLINIC_OR_DEPARTMENT_OTHER)
Admission: EM | Admit: 2024-09-26 | Discharge: 2024-09-26 | Disposition: A | Discharge status: Home / Self Care | Attending: Emergency Medicine | Admitting: Emergency Medicine

## 2024-09-26 ENCOUNTER — Other Ambulatory Visit: Payer: Self-pay

## 2024-09-26 DIAGNOSIS — R7989 Other specified abnormal findings of blood chemistry: Secondary | ICD-10-CM

## 2024-09-26 DIAGNOSIS — M549 Dorsalgia, unspecified: Secondary | ICD-10-CM | POA: Diagnosis not present

## 2024-09-26 DIAGNOSIS — E860 Dehydration: Secondary | ICD-10-CM | POA: Insufficient documentation

## 2024-09-26 DIAGNOSIS — R748 Abnormal levels of other serum enzymes: Secondary | ICD-10-CM | POA: Diagnosis not present

## 2024-09-26 DIAGNOSIS — R109 Unspecified abdominal pain: Secondary | ICD-10-CM | POA: Insufficient documentation

## 2024-09-26 DIAGNOSIS — Z79899 Other long term (current) drug therapy: Secondary | ICD-10-CM | POA: Diagnosis not present

## 2024-09-26 DIAGNOSIS — I10 Essential (primary) hypertension: Secondary | ICD-10-CM | POA: Diagnosis not present

## 2024-09-26 LAB — URINALYSIS, ROUTINE W REFLEX MICROSCOPIC
Bilirubin Urine: NEGATIVE
Glucose, UA: NEGATIVE mg/dL
Hgb urine dipstick: NEGATIVE
Ketones, ur: NEGATIVE mg/dL
Leukocytes,Ua: NEGATIVE
Nitrite: NEGATIVE
Protein, ur: NEGATIVE mg/dL
Specific Gravity, Urine: 1.009 (ref 1.005–1.030)
pH: 6 (ref 5.0–8.0)

## 2024-09-26 LAB — CBC
HCT: 41.2 % (ref 36.0–46.0)
Hemoglobin: 13.7 g/dL (ref 12.0–15.0)
MCH: 34.3 pg — ABNORMAL HIGH (ref 26.0–34.0)
MCHC: 33.3 g/dL (ref 30.0–36.0)
MCV: 103 fL — ABNORMAL HIGH (ref 80.0–100.0)
Platelets: 207 K/uL (ref 150–400)
RBC: 4 MIL/uL (ref 3.87–5.11)
RDW: 12.5 % (ref 11.5–15.5)
WBC: 8.8 K/uL (ref 4.0–10.5)
nRBC: 0 % (ref 0.0–0.2)

## 2024-09-26 LAB — COMPREHENSIVE METABOLIC PANEL WITH GFR
ALT: 25 U/L (ref 0–44)
AST: 32 U/L (ref 15–41)
Albumin: 4.6 g/dL (ref 3.5–5.0)
Alkaline Phosphatase: 68 U/L (ref 38–126)
Anion gap: 11 (ref 5–15)
BUN: 43 mg/dL — ABNORMAL HIGH (ref 8–23)
CO2: 31 mmol/L (ref 22–32)
Calcium: 10.3 mg/dL (ref 8.9–10.3)
Chloride: 92 mmol/L — ABNORMAL LOW (ref 98–111)
Creatinine, Ser: 1.42 mg/dL — ABNORMAL HIGH (ref 0.44–1.00)
GFR, Estimated: 41 mL/min — ABNORMAL LOW (ref >60)
Glucose, Bld: 100 mg/dL — ABNORMAL HIGH (ref 70–99)
Potassium: 3.4 mmol/L — ABNORMAL LOW (ref 3.5–5.1)
Sodium: 133 mmol/L — ABNORMAL LOW (ref 135–145)
Total Bilirubin: 0.5 mg/dL (ref 0.0–1.2)
Total Protein: 7.9 g/dL (ref 6.5–8.1)

## 2024-09-26 LAB — LIPASE, BLOOD: Lipase: 60 U/L — ABNORMAL HIGH (ref 11–51)

## 2024-09-26 MED ORDER — SODIUM CHLORIDE 0.9 % IV BOLUS
1000.0000 mL | Freq: Once | INTRAVENOUS | Status: AC
  Administered 2024-09-26: 1000 mL via INTRAVENOUS

## 2024-09-26 NOTE — Discharge Instructions (Signed)
 Your lipase was mildly elevated in the emergency department today, however this has improved significantly from last time your lipase was collected.  You were also found to have a mild elevation in your creatinine, this likely correlates with dehydration.  Please ensure that you are drinking adequate fluids.  Follow-up with your primary care provider in regard to your lipase/creatinine for a recheck of these labs.  Return to the emergency department if your symptoms worsen.

## 2024-09-26 NOTE — ED Triage Notes (Addendum)
 Arrives ambulatory to the ED with complaints of abdominal pressure, back pain, and concerns about her lipase levels. She also reports feeling off balance and she had a fall one week ago.   Reports that she is on weight loss injections

## 2024-09-26 NOTE — ED Provider Notes (Signed)
 [place] EMERGENCY DEPARTMENT AT [facility] Provider Note   CSN: [id number] Arrival date & time: 09/26/24  1652     Patient presents with: Back Pain, Abdominal Pain, and Abnormal Lab   [name] is a 63 y.o. female.  {Add pertinent medical, surgical, social history, OB history to HPI:474} 63 year old female presenting with multiple complaints.  Patient describes several weeks of difficulty with short-term memory loss, word finding, and at times balance.  She describes that sometimes when she is standing still if she has to close her eyes she feels like she may fall.  She has discussed these issues with her PCP who has scheduled a CT scan for her this week.  She also endorses several days of more consistent upper abdominal cramping with occasional right sided flank pain, denies abdominal pain/nausea/vomiting/diarrhea/fever/dysuria.  She is concerned because she was found to have an elevated lipase level in the past as well as elevated LFTs, at the time it was believed that her lipase was elevated secondary to GLP-1 medication and that her LFT elevation was attributed to statins, she has not taken her GLP-1 in 10 days because it did not make me feel good, she is still taking statins because her LFTs normalized after a period of the medication being discontinued and restarted by her PCP.  History of stroke 4 to 5 years ago, she is not on blood thinners.   Back Pain Associated symptoms: abdominal pain   Abdominal Pain Abnormal Lab      Prior to Admission medications   Medication Sig Start Date End Date Taking? Authorizing Provider  albuterol  (PROVENTIL ) (2.5 MG/3ML) 0.083% nebulizer solution Take 3 mLs (2.5 mg total) by nebulization 2 (two) times daily as needed for wheezing or shortness of breath. 01/18/19   Shona Terry SAILOR, DO  ALPRAZolam  (XANAX ) 0.5 MG tablet Take 0.5 mg by mouth daily as needed for anxiety. 06/11/18   [provider]  atorvastatin  (LIPITOR) 40 MG tablet Take 40 mg by mouth daily. 04/14/21   [provider]  buPROPion  (WELLBUTRIN  XL) 300 MG 24 hr tablet Take 300 mg by mouth every morning.    [provider]  clindamycin  (CLEOCIN ) 300 MG capsule Take 1 capsule (300 mg total) by mouth 3 (three) times daily. 10/09/23   Gershon Donnice SAUNDERS, DPM  ezetimibe  (ZETIA ) 10 MG tablet Take 1 tablet (10 mg total) by mouth daily. 08/28/18   Whitfield Raisin, NP  fluticasone (FLONASE) 50 MCG/ACT nasal spray Place 2 sprays into both nostrils daily as needed for rhinitis. 04/07/21   [provider]  losartan  (COZAAR ) 25 MG tablet Take 25 mg by mouth daily.    [provider]  metoprolol  succinate (TOPROL -XL) 25 MG 24 hr tablet Take 25 mg by mouth daily.    [provider]  metoprolol  tartrate (LOPRESSOR ) 25 MG tablet TAKE 1 TABLET BY MOUTH AS NEEDED FOR PALPITATIONS 07/03/21   Pietro Redell RAMAN, MD  mupirocin  ointment (BACTROBAN ) 2 % Apply 1 Application topically 2 (two) times daily. 10/07/23   Gershon Donnice SAUNDERS, DPM  ondansetron  (ZOFRAN -ODT) 8 MG disintegrating tablet Take 1 tablet (8 mg total) by mouth every 8 (eight) hours as needed for nausea or vomiting. 05/22/24   Dasie Faden, MD  rosuvastatin  (CRESTOR ) 40 MG tablet Take 40 mg by mouth daily. 05/30/23   [provider]  zolpidem  (AMBIEN ) 5 MG tablet Take 5 mg by mouth at bedtime as needed for sleep.    [provider]  Allergies: Ancef  [cefazolin ], Ceftin [cefuroxime], Penicillins, Gabapentin , and Other    Review of Systems  Gastrointestinal:  Positive for abdominal pain.  Musculoskeletal:  Positive for back pain.    Updated Vital Signs  Vitals:   09/26/24 1702 09/26/24 1702  BP: 129/87   Pulse: 73   Resp: 20   Temp: 98.2 F (36.8 C)   TempSrc: Oral   SpO2: 100%   Weight:  56.2 kg  Height:  5' 4 (1.626 m)     Physical Exam Vitals and nursing note reviewed.  HENT:     Head: Normocephalic.  Eyes:      Extraocular Movements: Extraocular movements intact.  Cardiovascular:     Rate and Rhythm: Normal rate and regular rhythm.     Heart sounds: Normal heart sounds.  Pulmonary:     Effort: Pulmonary effort is normal.     Breath sounds: Normal breath sounds.  Abdominal:     Palpations: Abdomen is soft.     Tenderness: There is no abdominal tenderness. There is no right CVA tenderness, left CVA tenderness or guarding.  Musculoskeletal:     Cervical back: Normal range of motion.     Comments: Moves all extremities spontaneously without difficulty  Skin:    General: Skin is warm and dry.  Neurological:     General: No focal deficit present.     Mental Status: She is alert and oriented to person, place, and time.     Cranial Nerves: No cranial nerve deficit.     Comments: Facial expressions are symmetric and intact without evidence of facial droop Negative pronator drift Normal cerebellar testing without ataxia, including rapid alternating movements and finger-to-nose Ambulates on her own without difficulty     (all labs ordered are listed, but only abnormal results are displayed) Labs Reviewed  CBC - Abnormal; Notable for the following components:      Result Value   MCV 103.0 (*)    MCH 34.3 (*)    All other components within normal limits  URINALYSIS, ROUTINE W REFLEX MICROSCOPIC - Abnormal; Notable for the following components:   Color, Urine COLORLESS (*)    All other components within normal limits  LIPASE, BLOOD  COMPREHENSIVE METABOLIC PANEL WITH GFR    EKG: None  Radiology: No results found.  {Document cardiac monitor, telemetry assessment procedure when appropriate:32947} Procedures   Medications Ordered in the ED - No data to display    {Click here for ABCD2, HEART and other calculators REFRESH Note before signing:1}                              Medical Decision Making This patient presents to the ED for concern of ***, this involves an extensive number of  treatment options, and is a complaint that carries with it a high risk of complications and morbidity.  The differential diagnosis includes ***   Co morbidities that complicate the patient evaluation  ***   Additional history obtained:  Additional history obtained from *** External records from outside source obtained and reviewed including ***   Lab Tests:  I Ordered, and personally interpreted labs.  The pertinent results include: CBC without leukocytosis, stable from previous.  CMP notable for borderline hyponatremia/hypokalemia with sodium of 133 and potassium of 3.4, creatinine is elevated at 1.42 with patient's most recent baseline of 1.05 from 4 months ago, normal LFTs.  Urinalysis within normal limits.  Lipase mildly elevated at 60,  this has trended down from most recent results of 279 from 4 months ago.   Imaging Studies ordered:  I ordered imaging studies including ***  I independently visualized and interpreted imaging which showed *** I agree with the radiologist interpretation   Cardiac Monitoring: / EKG:  The patient was maintained on a cardiac monitor.  I personally viewed and interpreted the cardiac monitored which showed an underlying rhythm of: ***   Consultations Obtained:  I requested consultation with the ***,  and discussed lab and imaging findings as well as pertinent plan - they recommend: ***   Problem List / ED Course / Critical interventions / Medication management  *** I ordered medication including ***  for ***  Reevaluation of the patient after these medicines showed that the patient {resolved/improved/worsened:23923::improved} I have reviewed the patients home medicines and have made adjustments as needed   Social Determinants of Health:  ***   Test / Admission - Considered:  Physical exam notable as above.  Labs notable for mild elevation in lipase, however this has significantly improved as compared to last time she was evaluated  in the emergency department.  Creatinine is mildly above baseline, IV fluid bolus given, patient admits that she has only had 1 cup of coffee to drink today, I suspect that this elevation in her creatinine is secondary to dehydration due to poor p.o. intake, as she has not had any episodes of vomiting/diarrhea lately to explain this.  I discussed these findings in depth with the patient and recommend that she follow-up with her PCP in regard to her abnormal lab results, she voiced understanding and is in agreement this plan.  Return precautions discussed.  She is appropriate for discharge at this time.    Amount and/or Complexity of Data Reviewed Labs: ordered.   ***  {Document critical care time when appropriate  Document review of labs and clinical decision tools ie CHADS2VASC2, etc  Document your independent review of radiology images and any outside records  Document your discussion with family members, caretakers and with consultants  Document social determinants of health affecting pt's care  Document your decision making why or why not admission, treatments were needed:32947:::1}   Final diagnoses:  None    ED Discharge Orders     None

## 2024-09-29 ENCOUNTER — Emergency Department (HOSPITAL_COMMUNITY)

## 2024-09-29 ENCOUNTER — Encounter (HOSPITAL_COMMUNITY): Payer: Self-pay

## 2024-09-29 ENCOUNTER — Observation Stay (HOSPITAL_COMMUNITY)
Admission: EM | Admit: 2024-09-29 | Discharge: 2024-09-30 | Disposition: A | Discharge status: Home / Self Care | Attending: Internal Medicine | Admitting: Internal Medicine

## 2024-09-29 ENCOUNTER — Other Ambulatory Visit: Payer: Self-pay

## 2024-09-29 DIAGNOSIS — E785 Hyperlipidemia, unspecified: Secondary | ICD-10-CM | POA: Insufficient documentation

## 2024-09-29 DIAGNOSIS — N1831 Chronic kidney disease, stage 3a: Secondary | ICD-10-CM | POA: Insufficient documentation

## 2024-09-29 DIAGNOSIS — U071 COVID-19: Secondary | ICD-10-CM | POA: Diagnosis present

## 2024-09-29 DIAGNOSIS — S92341A Displaced fracture of fourth metatarsal bone, right foot, initial encounter for closed fracture: Secondary | ICD-10-CM | POA: Diagnosis not present

## 2024-09-29 DIAGNOSIS — S0003XA Contusion of scalp, initial encounter: Secondary | ICD-10-CM | POA: Insufficient documentation

## 2024-09-29 DIAGNOSIS — I129 Hypertensive chronic kidney disease with stage 1 through stage 4 chronic kidney disease, or unspecified chronic kidney disease: Secondary | ICD-10-CM | POA: Insufficient documentation

## 2024-09-29 DIAGNOSIS — F10920 Alcohol use, unspecified with intoxication, uncomplicated: Secondary | ICD-10-CM | POA: Insufficient documentation

## 2024-09-29 DIAGNOSIS — R55 Syncope and collapse: Principal | ICD-10-CM | POA: Insufficient documentation

## 2024-09-29 DIAGNOSIS — S92344A Nondisplaced fracture of fourth metatarsal bone, right foot, initial encounter for closed fracture: Secondary | ICD-10-CM | POA: Insufficient documentation

## 2024-09-29 DIAGNOSIS — W19XXXA Unspecified fall, initial encounter: Secondary | ICD-10-CM | POA: Diagnosis not present

## 2024-09-29 DIAGNOSIS — J45909 Unspecified asthma, uncomplicated: Secondary | ICD-10-CM | POA: Diagnosis not present

## 2024-09-29 DIAGNOSIS — Z8673 Personal history of transient ischemic attack (TIA), and cerebral infarction without residual deficits: Secondary | ICD-10-CM | POA: Diagnosis not present

## 2024-09-29 DIAGNOSIS — F17210 Nicotine dependence, cigarettes, uncomplicated: Secondary | ICD-10-CM | POA: Diagnosis not present

## 2024-09-29 DIAGNOSIS — G4700 Insomnia, unspecified: Secondary | ICD-10-CM | POA: Diagnosis not present

## 2024-09-29 DIAGNOSIS — E876 Hypokalemia: Secondary | ICD-10-CM | POA: Diagnosis not present

## 2024-09-29 DIAGNOSIS — F32A Depression, unspecified: Secondary | ICD-10-CM | POA: Diagnosis not present

## 2024-09-29 DIAGNOSIS — I10 Essential (primary) hypertension: Secondary | ICD-10-CM | POA: Diagnosis present

## 2024-09-29 DIAGNOSIS — S0990XA Unspecified injury of head, initial encounter: Secondary | ICD-10-CM

## 2024-09-29 DIAGNOSIS — F419 Anxiety disorder, unspecified: Secondary | ICD-10-CM | POA: Diagnosis not present

## 2024-09-29 LAB — CBC WITH DIFFERENTIAL/PLATELET
Abs Immature Granulocytes: 0.03 K/uL (ref 0.00–0.07)
Basophils Absolute: 0 K/uL (ref 0.0–0.1)
Basophils Relative: 0 %
Eosinophils Absolute: 0.1 K/uL (ref 0.0–0.5)
Eosinophils Relative: 1 %
HCT: 38 % (ref 36.0–46.0)
Hemoglobin: 12.6 g/dL (ref 12.0–15.0)
Immature Granulocytes: 0 %
Lymphocytes Relative: 12 %
Lymphs Abs: 1.1 K/uL (ref 0.7–4.0)
MCH: 33.8 pg (ref 26.0–34.0)
MCHC: 33.2 g/dL (ref 30.0–36.0)
MCV: 101.9 fL — ABNORMAL HIGH (ref 80.0–100.0)
Monocytes Absolute: 0.8 K/uL (ref 0.1–1.0)
Monocytes Relative: 10 %
Neutro Abs: 6.5 K/uL (ref 1.7–7.7)
Neutrophils Relative %: 77 %
Platelets: 219 K/uL (ref 150–400)
RBC: 3.73 MIL/uL — ABNORMAL LOW (ref 3.87–5.11)
RDW: 12.3 % (ref 11.5–15.5)
WBC: 8.5 K/uL (ref 4.0–10.5)
nRBC: 0 % (ref 0.0–0.2)

## 2024-09-29 LAB — COMPREHENSIVE METABOLIC PANEL WITH GFR
ALT: 31 U/L (ref 0–44)
AST: 34 U/L (ref 15–41)
Albumin: 3.4 g/dL — ABNORMAL LOW (ref 3.5–5.0)
Alkaline Phosphatase: 55 U/L (ref 38–126)
Anion gap: 15 (ref 5–15)
BUN: 29 mg/dL — ABNORMAL HIGH (ref 8–23)
CO2: 27 mmol/L (ref 22–32)
Calcium: 8.8 mg/dL — ABNORMAL LOW (ref 8.9–10.3)
Chloride: 92 mmol/L — ABNORMAL LOW (ref 98–111)
Creatinine, Ser: 1.36 mg/dL — ABNORMAL HIGH (ref 0.44–1.00)
GFR, Estimated: 43 mL/min — ABNORMAL LOW (ref >60)
Glucose, Bld: 109 mg/dL — ABNORMAL HIGH (ref 70–99)
Potassium: 3.3 mmol/L — ABNORMAL LOW (ref 3.5–5.1)
Sodium: 134 mmol/L — ABNORMAL LOW (ref 135–145)
Total Bilirubin: 0.8 mg/dL (ref 0.0–1.2)
Total Protein: 6.8 g/dL (ref 6.5–8.1)

## 2024-09-29 LAB — TROPONIN I (HIGH SENSITIVITY)
Troponin I (High Sensitivity): <2 ng/L (ref <18)
Troponin I (High Sensitivity): <2 ng/L (ref <18)

## 2024-09-29 LAB — MAGNESIUM: Magnesium: 1.5 mg/dL — ABNORMAL LOW (ref 1.7–2.4)

## 2024-09-29 MED ORDER — OXYCODONE-ACETAMINOPHEN 5-325 MG PO TABS
1.0000 | ORAL_TABLET | Freq: Once | ORAL | Status: AC
  Administered 2024-09-29: 1 via ORAL
  Filled 2024-09-29: qty 1

## 2024-09-29 MED ORDER — MECLIZINE HCL 25 MG PO TABS
25.0000 mg | ORAL_TABLET | Freq: Once | ORAL | Status: AC
  Administered 2024-09-29: 25 mg via ORAL
  Filled 2024-09-29: qty 1

## 2024-09-29 MED ORDER — POTASSIUM CHLORIDE CRYS ER 20 MEQ PO TBCR
40.0000 meq | EXTENDED_RELEASE_TABLET | Freq: Once | ORAL | Status: AC
Start: 2024-09-29 — End: 2024-09-29
  Administered 2024-09-29: 40 meq via ORAL
  Filled 2024-09-29: qty 2

## 2024-09-29 MED ORDER — FENTANYL CITRATE PF 50 MCG/ML IJ SOSY
50.0000 ug | PREFILLED_SYRINGE | Freq: Once | INTRAMUSCULAR | Status: AC
  Administered 2024-09-29: 50 ug via INTRAVENOUS
  Filled 2024-09-29: qty 1

## 2024-09-29 MED ORDER — DIAZEPAM 5 MG/ML IJ SOLN
2.5000 mg | Freq: Once | INTRAMUSCULAR | Status: DC
Start: 2024-09-29 — End: 2024-09-29

## 2024-09-29 MED ORDER — LACTATED RINGERS IV BOLUS
1000.0000 mL | Freq: Once | INTRAVENOUS | Status: AC
  Administered 2024-09-29: 1000 mL via INTRAVENOUS

## 2024-09-29 MED ORDER — MAGNESIUM SULFATE 2 GM/50ML IV SOLN
2.0000 g | Freq: Once | INTRAVENOUS | Status: AC
  Administered 2024-09-29: 2 g via INTRAVENOUS
  Filled 2024-09-29: qty 50

## 2024-09-29 NOTE — ED Provider Notes (Signed)
 [place] EMERGENCY DEPARTMENT AT [facility] Provider Note   CSN: [id number] Arrival date & time: 09/29/24  1027     Patient presents with: Felton Browning [name] is a 63 y.o. female.   63 year old female with past medical history of hypertension, hyperlipidemia, and CVA in the past who recently was positive at home for COVID-19 presenting to the emergency department today with 2 falls in the past week.  The patient states that she woke up on the floor and apparently did hit her head.  She is also having pain in her right foot.  States that a similar episode happened last week while she was standing.  She was evaluated at that time and had febrile workup and went home.  States that she has been having some disequilibrium even prior to this that has been going on now for the past few weeks.  She states that she does have some gait instability with this.  She came to the emergency department today after the fall.  Denies any associated chest pain or shortness of breath.  Denies any leg pain or swelling.   Fall Associated symptoms include headaches.       Prior to Admission medications   Medication Sig Start Date End Date Taking? Authorizing Provider  albuterol  (PROVENTIL ) (2.5 MG/3ML) 0.083% nebulizer solution Take 3 mLs (2.5 mg total) by nebulization 2 (two) times daily as needed for wheezing or shortness of breath. 01/18/19   Shona Terry SAILOR, DO  ALPRAZolam  (XANAX ) 0.5 MG tablet Take 0.5 mg by mouth daily as needed for anxiety. 06/11/18   [provider]  atorvastatin (LIPITOR) 40 MG tablet Take 40 mg by mouth daily. 04/14/21   [provider]  buPROPion  (WELLBUTRIN  XL) 300 MG 24 hr tablet Take 300 mg by mouth every morning.    [provider]  clindamycin  (CLEOCIN ) 300 MG capsule Take 1 capsule (300 mg total) by mouth 3 (three) times daily. 10/09/23   Gershon Donnice SAUNDERS, DPM  ezetimibe  (ZETIA ) 10 MG tablet Take 1 tablet (10 mg total) by mouth daily.  08/28/18   Whitfield Raisin, NP  fluticasone (FLONASE) 50 MCG/ACT nasal spray Place 2 sprays into both nostrils daily as needed for rhinitis. 04/07/21   [provider]  losartan  (COZAAR ) 25 MG tablet Take 25 mg by mouth daily.    [provider]  metoprolol  succinate (TOPROL -XL) 25 MG 24 hr tablet Take 25 mg by mouth daily.    [provider]  metoprolol  tartrate (LOPRESSOR ) 25 MG tablet TAKE 1 TABLET BY MOUTH AS NEEDED FOR PALPITATIONS 07/03/21   Pietro Redell RAMAN, MD  mupirocin  ointment (BACTROBAN ) 2 % Apply 1 Application topically 2 (two) times daily. 10/07/23   Gershon Donnice SAUNDERS, DPM  ondansetron  (ZOFRAN -ODT) 8 MG disintegrating tablet Take 1 tablet (8 mg total) by mouth every 8 (eight) hours as needed for nausea or vomiting. 05/22/24   Dasie Faden, MD  rosuvastatin  (CRESTOR ) 40 MG tablet Take 40 mg by mouth daily. 05/30/23   [provider]  zolpidem  (AMBIEN ) 5 MG tablet Take 5 mg by mouth at bedtime as needed for sleep.    [provider]    Allergies: Ancef  [cefazolin ], Ceftin [cefuroxime], Penicillins, Gabapentin , and Other    Review of Systems  Neurological:  Positive for dizziness and headaches.  All other systems reviewed and are negative.   Updated Vital Signs BP 130/86   Pulse 61   Temp 98 F (36.7 C) (Oral)  Resp 18   Ht 5' 3 (1.6 m)   Wt 54.4 kg   SpO2 100%   BMI 21.26 kg/m   Physical Exam Vitals and nursing note reviewed.   Gen: NAD Eyes: PERRL, EOMI, no obvious unilateral nystagmus noted, no vertical nystagmus HEENT: no oropharyngeal swelling Neck: trachea midline Resp: clear to auscultation bilaterally Card: RRR, no murmurs, rubs, or gallops Abd: nontender, nondistended Extremities: no calf tenderness, no edema Vascular: 2+ radial pulses bilaterally, 2+ DP pulses bilaterally Neuro: Cranial nerves intact, equal strength sensation throughout bilateral upper and lower extremities with no dysmetria on finger-to-nose  testing Skin: no rashes Psyc: acting appropriately   (all labs ordered are listed, but only abnormal results are displayed) Labs Reviewed  COMPREHENSIVE METABOLIC PANEL WITH GFR - Abnormal; Notable for the following components:      Result Value   Sodium 134 (*)    Potassium 3.3 (*)    Chloride 92 (*)    Glucose, Bld 109 (*)    BUN 29 (*)    Creatinine, Ser 1.36 (*)    Calcium  8.8 (*)    Albumin 3.4 (*)    GFR, Estimated 43 (*)    All other components within normal limits  CBC WITH DIFFERENTIAL/PLATELET - Abnormal; Notable for the following components:   RBC 3.73 (*)    MCV 101.9 (*)    All other components within normal limits  MAGNESIUM  - Abnormal; Notable for the following components:   Magnesium  1.5 (*)    All other components within normal limits  TROPONIN I (HIGH SENSITIVITY)  TROPONIN I (HIGH SENSITIVITY)    EKG: None  Radiology: MR BRAIN WO CONTRAST Result Date: 09/29/2024 EXAM: MR Brain without Intravenous Contrast. CLINICAL HISTORY: Neuro deficit, acute, stroke suspected; Dizziness. TECHNIQUE: Magnetic resonance images of the brain without intravenous contrast in multiple planes. CONTRAST: Without. COMPARISON: Same day CT head FINDINGS: BRAIN: No restricted diffusion to indicate acute infarction. No intracranial mass or hemorrhage. No midline shift or extra-axial fluid collection. No cerebellar tonsillar ectopia. The central arterial and venous flow voids are patent. VENTRICLES: No hydrocephalus. ORBITS: The orbits are normal. SINUSES AND MASTOIDS: The sinuses and mastoid air cells are clear. BONES: No acute fracture or focal osseous lesion. IMPRESSION: 1. Unremarkable brain MRI. Electronically signed by: Gilmore Molt MD 09/29/2024 09:14 PM EDT RP Workstation: [id number]   CT Head Wo Contrast Result Date: 09/29/2024 EXAM: CT HEAD WITHOUT CONTRAST 09/29/2024 12:35:00 PM TECHNIQUE: CT of the head was performed without the administration of intravenous contrast.  Automated exposure control, iterative reconstruction, and/or weight based adjustment of the mA/kV was utilized to reduce the radiation dose to as low as reasonably achievable. COMPARISON: MRI 10/21/2023 and head CT 10/21/2023. CLINICAL HISTORY: 63 year old female with syncope/presyncope, fall, and suspected cerebrovascular cause. FINDINGS: BRAIN AND VENTRICLES: No acute hemorrhage. No evidence of acute infarct. No hydrocephalus. No extra-axial collection. No mass effect or midline shift. Normal for age noncontrast CT appearance of the brain. Calcified atherosclerosis at the skull base. No suspicious intracranial vascular hyperdensity. ORBITS: No acute abnormality. SINUSES: No acute abnormality. SOFT TISSUES AND SKULL: Mild posterior left superior scalp convexity soft tissue swelling and stranding compatible with mild superficial hematoma/contusion on series 268. Underlying calvarium intact. No soft tissue gas. IMPRESSION: 1. Mild left superior scalp superficial hematoma/contusion.  Calvarium intact. 2. Normal for age non-contrast CT appearance of the brain. Electronically signed by: Helayne Hurst MD 09/29/2024 01:10 PM EDT RP Workstation: [id number]   DG Foot Complete Right Result Date:  09/29/2024 CLINICAL DATA:  Right foot pain after fall last night. EXAM: RIGHT FOOT COMPLETE - 3+ VIEW COMPARISON:  None Available. FINDINGS: Mildly displaced oblique fracture is seen involving the distal fourth metatarsal. Joint spaces are unremarkable. Soft tissues are unremarkable. IMPRESSION: Mildly displaced distal fourth metatarsal fracture. Electronically Signed   By: Lynwood Landy Raddle M.D.   On: 09/29/2024 13:04   DG Knee Complete 4 Views Left Result Date: 09/29/2024 CLINICAL DATA:  Left knee pain after fall last night. EXAM: LEFT KNEE - COMPLETE 4+ VIEW COMPARISON:  None Available. FINDINGS: No evidence of fracture, dislocation, or joint effusion. No evidence of arthropathy or other focal bone abnormality. Soft tissues are  unremarkable. IMPRESSION: Negative. Electronically Signed   By: Lynwood Landy Raddle M.D.   On: 09/29/2024 13:02     Procedures   Medications Ordered in the ED  magnesium  sulfate IVPB 2 g 50 mL (0 g Intravenous Stopped 09/29/24 1904)  lactated ringers  bolus 1,000 mL (0 mLs Intravenous Stopped 09/29/24 2112)  fentaNYL  (SUBLIMAZE ) injection 50 mcg (50 mcg Intravenous Given 09/29/24 1739)  meclizine (ANTIVERT) tablet 25 mg (25 mg Oral Given 09/29/24 1738)  oxyCODONE -acetaminophen  (PERCOCET/ROXICET) 5-325 MG per tablet 1 tablet (1 tablet Oral Given 09/29/24 2058)  potassium chloride  SA (KLOR-CON  M) CR tablet 40 mEq (40 mEq Oral Given 09/29/24 2131)                                    Medical Decision Making 63 year old female with past medical history of CVA as well as hypertension hyperlipidemia presenting to the emergency department today after 2 unexplained falls in the past week.  Exam like they very well may be syncopal episodes.  Acute patient with cardiac monitor.  Will obtain basic labs to eval for electrolyte abnormality as well as an EKG to look for conduction disturbances.  Her CT scan of her head performed at triage is negative.  X-ray does show a fracture of her metatarsal.  The patient declines a postop shoe at this time as she does have 1 at home and would rather use hers at home.  I will give the patient with fentanyl  and meclizine for symptoms.  Will obtain MRI to evaluate for CVA.  I will reevaluate for ultimate disposition.  The patient's magnesium  level was low here.  She is given IV magnesium .  Her labs are reassuring.  MRI is negative.  Given the 2 syncopal episodes and calls placed to the hospitalist service for admission.  Amount and/or Complexity of Data Reviewed Radiology: ordered.  Risk Prescription drug management. Decision regarding hospitalization.        Final diagnoses:  Syncope, unspecified syncope type  Displaced fracture of fourth metatarsal bone, right foot,  initial encounter for closed fracture  Closed head injury, initial encounter    ED Discharge Orders     None          Ula Prentice SAUNDERS, MD 09/29/24 2326

## 2024-09-29 NOTE — ED Notes (Signed)
 Patient transported to MRI

## 2024-09-29 NOTE — ED Provider Triage Note (Signed)
 Emergency Medicine Provider Triage Evaluation Note  [name] , a 63 y.o. female  was evaluated in triage.  Pt complains of memory loss, falls, injury to left knee and right foot.  Review of Systems  Positive: Frequent falls Negative: Syncope  Physical Exam  BP 129/82 (BP Location: Right Arm)   Pulse 63   Temp 98.4 F (36.9 C)   Resp 18   Ht 5' 3 (1.6 m)   Wt 54.4 kg   SpO2 100%   BMI 21.26 kg/m  Gen:   Awake, no distress   Resp:  Normal effort  MSK:   Moves extremities without difficulty  Other:    Medical Decision Making  Medically screening exam initiated at 10:44 AM.  Appropriate orders placed.  CARRYE GOLLER was informed that the remainder of the evaluation will be completed by another provider, this initial triage assessment does not replace that evaluation, and the importance of remaining in the ED until their evaluation is complete.     Towana Ozell BROCKS, MD 09/29/24 1045

## 2024-09-29 NOTE — ED Notes (Signed)
Pt wheel chaired to the bathroom

## 2024-09-29 NOTE — ED Triage Notes (Signed)
 Pt states she fell from standing, possible LOC. Pt having hard time remembering what happened. Pt states she feels like she is in slow motion, last night fell and hit head on hardwood floor.  Pt also had a fall last week. COVID positive test 2 days ago. Pt has bruising on right foot which she states is from fall last week.

## 2024-09-29 NOTE — ED Notes (Signed)
 CCMD called for cardiac monitoring.

## 2024-09-30 ENCOUNTER — Observation Stay (HOSPITAL_BASED_OUTPATIENT_CLINIC_OR_DEPARTMENT_OTHER)

## 2024-09-30 ENCOUNTER — Encounter (HOSPITAL_COMMUNITY): Payer: Self-pay | Admitting: Family Medicine

## 2024-09-30 DIAGNOSIS — S92344A Nondisplaced fracture of fourth metatarsal bone, right foot, initial encounter for closed fracture: Secondary | ICD-10-CM

## 2024-09-30 DIAGNOSIS — E876 Hypokalemia: Secondary | ICD-10-CM | POA: Diagnosis not present

## 2024-09-30 DIAGNOSIS — S92341A Displaced fracture of fourth metatarsal bone, right foot, initial encounter for closed fracture: Secondary | ICD-10-CM

## 2024-09-30 DIAGNOSIS — U071 COVID-19: Secondary | ICD-10-CM | POA: Diagnosis not present

## 2024-09-30 DIAGNOSIS — F419 Anxiety disorder, unspecified: Secondary | ICD-10-CM | POA: Diagnosis present

## 2024-09-30 DIAGNOSIS — R55 Syncope and collapse: Secondary | ICD-10-CM

## 2024-09-30 DIAGNOSIS — N1831 Chronic kidney disease, stage 3a: Secondary | ICD-10-CM | POA: Diagnosis present

## 2024-09-30 DIAGNOSIS — G4700 Insomnia, unspecified: Secondary | ICD-10-CM | POA: Diagnosis present

## 2024-09-30 HISTORY — DX: COVID-19: U07.1

## 2024-09-30 HISTORY — DX: Displaced fracture of fourth metatarsal bone, right foot, initial encounter for closed fracture: S92.341A

## 2024-09-30 LAB — CBC
HCT: 35.7 % — ABNORMAL LOW (ref 36.0–46.0)
Hemoglobin: 12.4 g/dL (ref 12.0–15.0)
MCH: 34.6 pg — ABNORMAL HIGH (ref 26.0–34.0)
MCHC: 34.7 g/dL (ref 30.0–36.0)
MCV: 99.7 fL (ref 80.0–100.0)
Platelets: 201 K/uL (ref 150–400)
RBC: 3.58 MIL/uL — ABNORMAL LOW (ref 3.87–5.11)
RDW: 12.2 % (ref 11.5–15.5)
WBC: 5.8 K/uL (ref 4.0–10.5)
nRBC: 0 % (ref 0.0–0.2)

## 2024-09-30 LAB — BASIC METABOLIC PANEL WITH GFR
Anion gap: 10 (ref 5–15)
BUN: 31 mg/dL — ABNORMAL HIGH (ref 8–23)
CO2: 28 mmol/L (ref 22–32)
Calcium: 9 mg/dL (ref 8.9–10.3)
Chloride: 100 mmol/L (ref 98–111)
Creatinine, Ser: 1.26 mg/dL — ABNORMAL HIGH (ref 0.44–1.00)
GFR, Estimated: 48 mL/min — ABNORMAL LOW (ref >60)
Glucose, Bld: 107 mg/dL — ABNORMAL HIGH (ref 70–99)
Potassium: 3.4 mmol/L — ABNORMAL LOW (ref 3.5–5.1)
Sodium: 138 mmol/L (ref 135–145)

## 2024-09-30 LAB — RESP PANEL BY RT-PCR (RSV, FLU A&B, COVID)  RVPGX2
Influenza A by PCR: NEGATIVE
Influenza B by PCR: NEGATIVE
Resp Syncytial Virus by PCR: NEGATIVE
SARS Coronavirus 2 by RT PCR: POSITIVE — AB

## 2024-09-30 LAB — HIV ANTIBODY (ROUTINE TESTING W REFLEX): HIV Screen 4th Generation wRfx: NONREACTIVE

## 2024-09-30 LAB — LIPID PANEL
Cholesterol: 166 mg/dL (ref 0–200)
HDL: 70 mg/dL (ref >40)
LDL Cholesterol: 78 mg/dL (ref 0–99)
Total CHOL/HDL Ratio: 2.4 ratio
Triglycerides: 88 mg/dL (ref <150)
VLDL: 18 mg/dL (ref 0–40)

## 2024-09-30 LAB — ECHOCARDIOGRAM COMPLETE
Area-P 1/2: 3.48 cm2
Height: 63 in
S' Lateral: 2.9 cm
Weight: 1920 [oz_av]

## 2024-09-30 LAB — MAGNESIUM: Magnesium: 2.1 mg/dL (ref 1.7–2.4)

## 2024-09-30 MED ORDER — ZOLPIDEM TARTRATE 5 MG PO TABS
5.0000 mg | ORAL_TABLET | Freq: Every evening | ORAL | Status: DC | PRN
Start: 2024-09-30 — End: 2024-09-30

## 2024-09-30 MED ORDER — ACETAMINOPHEN 325 MG PO TABS: 650.0000 mg | ORAL_TABLET | Freq: Four times a day (QID) | ORAL | Status: DC | PRN

## 2024-09-30 MED ORDER — ONDANSETRON HCL 4 MG PO TABS: 4.0000 mg | ORAL_TABLET | Freq: Four times a day (QID) | ORAL | Status: DC | PRN

## 2024-09-30 MED ORDER — SENNOSIDES-DOCUSATE SODIUM 8.6-50 MG PO TABS: 1.0000 | ORAL_TABLET | Freq: Every evening | ORAL | Status: DC | PRN

## 2024-09-30 MED ORDER — ACETAMINOPHEN 650 MG RE SUPP: 650.0000 mg | Freq: Four times a day (QID) | RECTAL | Status: DC | PRN

## 2024-09-30 MED ORDER — POTASSIUM CHLORIDE CRYS ER 20 MEQ PO TBCR
40.0000 meq | EXTENDED_RELEASE_TABLET | ORAL | Status: AC
  Administered 2024-09-30 (×2): 40 meq via ORAL
  Filled 2024-09-30 (×2): qty 2

## 2024-09-30 MED ORDER — ONDANSETRON HCL 4 MG/2ML IJ SOLN: 4.0000 mg | Freq: Four times a day (QID) | INTRAMUSCULAR | Status: DC | PRN

## 2024-09-30 MED ORDER — SODIUM CHLORIDE 0.9% FLUSH
3.0000 mL | Freq: Two times a day (BID) | INTRAVENOUS | Status: DC
  Administered 2024-09-30 (×2): 3 mL via INTRAVENOUS

## 2024-09-30 MED ORDER — ALPRAZOLAM 0.5 MG PO TABS: 0.5000 mg | ORAL_TABLET | Freq: Every day | ORAL | Status: DC | PRN

## 2024-09-30 MED ORDER — OXYCODONE HCL 5 MG PO TABS
5.0000 mg | ORAL_TABLET | ORAL | Status: DC | PRN
  Administered 2024-09-30 (×3): 5 mg via ORAL
  Filled 2024-09-30 (×3): qty 1

## 2024-09-30 MED ORDER — PROPRANOLOL HCL ER 80 MG PO CP24
80.0000 mg | ORAL_CAPSULE | Freq: Every morning | ORAL | Status: DC
  Filled 2024-09-30: qty 1

## 2024-09-30 MED ORDER — LOSARTAN POTASSIUM 50 MG PO TABS: 100.0000 mg | ORAL_TABLET | Freq: Every morning | ORAL | Status: DC

## 2024-09-30 MED ORDER — ROSUVASTATIN CALCIUM 20 MG PO TABS
40.0000 mg | ORAL_TABLET | Freq: Every day | ORAL | Status: DC
  Administered 2024-09-30: 40 mg via ORAL
  Filled 2024-09-30: qty 2

## 2024-09-30 MED ORDER — METHOCARBAMOL 500 MG PO TABS
500.0000 mg | ORAL_TABLET | Freq: Two times a day (BID) | ORAL | Status: DC | PRN
  Administered 2024-09-30 (×2): 500 mg via ORAL
  Filled 2024-09-30 (×2): qty 1

## 2024-09-30 MED ORDER — PROPRANOLOL HCL ER 60 MG PO CP24
60.0000 mg | ORAL_CAPSULE | Freq: Every morning | ORAL | Status: DC
  Administered 2024-09-30: 60 mg via ORAL
  Filled 2024-09-30: qty 1

## 2024-09-30 MED ORDER — ENOXAPARIN SODIUM 30 MG/0.3ML IJ SOSY
30.0000 mg | PREFILLED_SYRINGE | INTRAMUSCULAR | Status: DC
  Filled 2024-09-30: qty 0.3

## 2024-09-30 MED ORDER — BUPROPION HCL ER (XL) 150 MG PO TB24
300.0000 mg | ORAL_TABLET | Freq: Every morning | ORAL | Status: DC
  Administered 2024-09-30: 300 mg via ORAL
  Filled 2024-09-30: qty 2

## 2024-09-30 MED ORDER — LOSARTAN POTASSIUM 50 MG PO TABS: 50.0000 mg | ORAL_TABLET | Freq: Every morning | ORAL | Status: DC

## 2024-09-30 NOTE — Assessment & Plan Note (Signed)
 09/30/24 continue xanax  and wellbutrin 

## 2024-09-30 NOTE — H&P (Signed)
 History and Physical    [name] FMW:[id number] DOB: [birth year] DOA: 09/29/2024  PCP: Windy Coy, MD   Patient coming from: Home   Chief Complaint: Syncope, right foot pain, memory and speech problems   HPI: [name] is a 63 y.o. female with medical history significant for hypertension, CKD 3A, depression, anxiety, insomnia, and prior episodes of strokelike symptoms with negative MRIs who presents with multiple complaints including recurrent episodes syncope.   Patient reports that she was sitting down almost 2 weeks ago at a pickleball court, stood up, and then found herself seated on the ground with tailbone pain and inability to remember falling.  She then had 2 episodes yesterday, both shortly after rising from a seated position, and both without any preceding symptoms or warning.  She denies any recent chest pain or palpitations.  She also complains of weeks of intermittent word finding difficulty.  She denies any focal numbness or weakness.  She has also been experiencing right foot pain and bruising after one of the falls yesterday.  ED Course: Upon arrival to the ED, patient is found to be afebrile and saturating well on room air with normal HR and stable BP.  Labs are most notable for potassium 3.3, magnesium  1.5, creatinine 1.36, normal WBC, normal hemoglobin, and undetectable troponin x 2.  EKG demonstrates sinus rhythm.  MRI brain was unremarkable.  Plain radiographs of the right knee are negative for acute fracture or dislocation.  X-ray of the right foot reveals mildly displaced distal fourth metatarsal fracture.  Patient was treated in the ED with 1 L LR, 40 mEq oral potassium, and 2 g IV magnesium .  Review of Systems:  All other systems reviewed and apart from HPI, are negative.  Past Medical History:  Diagnosis Date   Adenomatous polyps    Anemia    Anxiety    Asthma    Blood transfusion without reported diagnosis 01/1999   following hysterectomy    Bulging discs    Endometriosis    History of atrial fibrillation    History of COVID-19    12/2018, 10/2020   Hx of respiratory failure    Hyperlipidemia    Hypertension    PONV (postoperative nausea and vomiting)    PVC (premature ventricular contraction)    Stroke [facility])    Urinary incontinence     Past Surgical History:  Procedure Laterality Date   5 back surgeries     ABDOMINAL HYSTERECTOMY  2001   TAH/RSO   AUGMENTATION MAMMAPLASTY Bilateral 2013   saline. pre pectoral   BLADDER SUSPENSION  2003   LSO and rectocele repair at [facility]   BREAST SURGERY     breast augmentation--saline implants   EPIDURAL BLOCK INJECTION     HERNIA REPAIR     I & D EXTREMITY Right 06/10/2021   Procedure: IRRIGATION AND DEBRIDEMENT ACHILLES;  Surgeon: Josefina Chew, MD;  Location: [facility] OR;  Service: Orthopedics;  Laterality: Right;   LUMBAR FUSION  2010   Dr. Gaither   REDUCTION MAMMAPLASTY Bilateral    REPLACEMENT TOTAL HIP W/  RESURFACING IMPLANTS  10/2021   SPINAL FUSION  2018   Dr. Colon   SPINAL FUSION  04/06/2020   SPINE SURGERY      Social History:   reports that she has quit smoking. Her smoking use included cigarettes. She has never used smokeless tobacco. She reports current alcohol use of about 6.0 - 8.0 standard drinks of alcohol per week. She reports that she  does not currently use drugs.  Allergies  Allergen Reactions   Ancef  [Cefazolin ] Hives    Cause rash shortness of breath and vomiting    Penicillins Hives and Other (See Comments)    Has patient had a PCN reaction causing immediate rash, facial/tongue/throat swelling, SOB or lightheadedness with hypotension: Yes Has patient had a PCN reaction causing severe rash involving mucus membranes or skin necrosis:Yes Has patient had a PCN reaction that required hospitalization:No Has patient had a PCN reaction occurring within the last 10 years: No If all of the above answers are NO, then may proceed with Cephalosporin use.     Ceftin [Cefuroxime] Nausea And Vomiting   Gabapentin  Other (See Comments)    Jittery, anxious    Family History  Problem Relation Age of Onset   Hypertension Mother    Stroke Mother    Hypertension Father    Cancer Father    Dementia Father    Hyperlipidemia Sister    Hypertension Sister    Stroke Maternal Aunt    Stroke Maternal Uncle    Colon cancer Maternal Uncle 72   Colon cancer Maternal Grandmother 28   Breast cancer Maternal Grandmother 45 - 59   Stroke Paternal Grandfather    Breast cancer Cousin 27   Hyperlipidemia Brother    Hypertension Brother    Hypertension Brother    Hypertension Brother      Prior to Admission medications   Medication Sig Start Date End Date Taking? Authorizing Provider  ALPRAZolam  (XANAX ) 0.5 MG tablet Take 0.5 mg by mouth daily as needed for anxiety. 06/11/18  Yes [provider]  buPROPion  (WELLBUTRIN  XL) 300 MG 24 hr tablet Take 300 mg by mouth every morning.   Yes [provider]  chlorthalidone (HYGROTON) 25 MG tablet Take 25 mg by mouth daily as needed (SBP>120). 09/08/24  Yes [provider]  ezetimibe  (ZETIA ) 10 MG tablet Take 1 tablet (10 mg total) by mouth daily. 08/28/18  Yes McCue, Harlene, NP  losartan  (COZAAR ) 100 MG tablet Take 50-100 mg by mouth every morning. 09/08/24  Yes [provider]  methocarbamol  (ROBAXIN ) 500 MG tablet Take 500 mg by mouth 2 (two) times daily. 09/17/24  Yes [provider]  ondansetron  (ZOFRAN -ODT) 8 MG disintegrating tablet Take 1 tablet (8 mg total) by mouth every 8 (eight) hours as needed for nausea or vomiting. 05/22/24  Yes Dasie Faden, MD  OVER THE COUNTER MEDICATION Take 1 Scoop by mouth 4 (four) times a week. VITAL PROTEINS COLLAGEN PEPTIDE POWDER   Yes [provider]  oxyCODONE  (OXY IR/ROXICODONE ) 5 MG immediate release tablet Take by mouth. 09/17/24  Yes [provider]  propranolol ER (INDERAL LA) 80 MG 24 hr capsule Take 80 mg by  mouth every morning. 09/12/24  Yes [provider]  rosuvastatin  (CRESTOR ) 40 MG tablet Take 40 mg by mouth daily. 05/30/23  Yes [provider]  zolpidem  (AMBIEN ) 5 MG tablet Take 5 mg by mouth at bedtime as needed for sleep.   Yes [provider]    Physical Exam: Vitals:   09/29/24 2230 09/29/24 2315 09/30/24 0133 09/30/24 0159  BP: 101/64 130/86  136/78  Pulse: (!) 54 61  (!) 55  Resp: 16 18    Temp:   97.9 F (36.6 C) (!) 97.5 F (36.4 C)  TempSrc:   Oral Oral  SpO2: 97% 100%  100%  Weight:      Height:        Constitutional:  NAD, no pallor or diaphoresis   Eyes: PERTLA, lids and conjunctivae normal ENMT: Mucous membranes are moist. Posterior pharynx clear of any exudate or lesions.   Neck: supple, no masses  Respiratory: no wheezing, no crackles. No accessory muscle use.  Cardiovascular: S1 & S2 heard, regular rate and rhythm. No extremity edema.  Abdomen: No tenderness, soft. Bowel sounds active.  Musculoskeletal: no clubbing / cyanosis. Right foot ecchymosis and tenderness.   Skin: no significant rashes, lesions, ulcers. Warm, dry, well-perfused. Neurologic: CN 2-12 grossly intact. Sensation to light touch intact. Strength 5/5 in all 4 limbs. Alert and oriented.  Psychiatric: Calm. Cooperative.    Labs and Imaging on Admission: I have personally reviewed following labs and imaging studies  CBC: Recent Labs  Lab 09/26/24 1709 09/29/24 1050  WBC 8.8 8.5  NEUTROABS  --  6.5  HGB 13.7 12.6  HCT 41.2 38.0  MCV 103.0* 101.9*  PLT 207 219   Basic Metabolic Panel: Recent Labs  Lab 09/26/24 1709 09/29/24 1050  NA 133* 134*  K 3.4* 3.3*  CL 92* 92*  CO2 31 27  GLUCOSE 100* 109*  BUN 43* 29*  CREATININE 1.42* 1.36*  CALCIUM  10.3 8.8*  MG  --  1.5*   GFR: Estimated Creatinine Clearance: 34.6 mL/min (A) (by C-G formula based on SCr of 1.36 mg/dL (H)). Liver Function Tests: Recent Labs  Lab 09/26/24 1709 09/29/24 1050  AST 32 34   ALT 25 31  ALKPHOS 68 55  BILITOT 0.5 0.8  PROT 7.9 6.8  ALBUMIN 4.6 3.4*   Recent Labs  Lab 09/26/24 1709  LIPASE 60*   No results for input(s): AMMONIA in the last 168 hours. Coagulation Profile: No results for input(s): INR, PROTIME in the last 168 hours. Cardiac Enzymes: No results for input(s): CKTOTAL, CKMB, CKMBINDEX, TROPONINI in the last 168 hours. BNP (last 3 results) No results for input(s): PROBNP in the last 8760 hours. HbA1C: No results for input(s): HGBA1C in the last 72 hours. CBG: No results for input(s): GLUCAP in the last 168 hours. Lipid Profile: No results for input(s): CHOL, HDL, LDLCALC, TRIG, CHOLHDL, LDLDIRECT in the last 72 hours. Thyroid  Function Tests: No results for input(s): TSH, T4TOTAL, FREET4, T3FREE, THYROIDAB in the last 72 hours. Anemia Panel: No results for input(s): VITAMINB12, FOLATE, FERRITIN, TIBC, IRON, RETICCTPCT in the last 72 hours. Urine analysis:    Component Value Date/Time   COLORURINE COLORLESS (A) 09/26/2024 1706   APPEARANCEUR CLEAR 09/26/2024 1706   LABSPEC 1.009 09/26/2024 1706   PHURINE 6.0 09/26/2024 1706   GLUCOSEU NEGATIVE 09/26/2024 1706   HGBUR NEGATIVE 09/26/2024 1706   BILIRUBINUR NEGATIVE 09/26/2024 1706   BILIRUBINUR N 04/27/2013 0944   KETONESUR NEGATIVE 09/26/2024 1706   PROTEINUR NEGATIVE 09/26/2024 1706   UROBILINOGEN negative 04/27/2013 0944   UROBILINOGEN 0.2 09/23/2008 1004   NITRITE NEGATIVE 09/26/2024 1706   LEUKOCYTESUR NEGATIVE 09/26/2024 1706   Sepsis Labs: @LABRCNTIP (procalcitonin:4,lacticidven:4) ) Recent Results (from the past 240 hours)  Resp panel by RT-PCR (RSV, Flu A&B, Covid) Anterior Nasal Swab     Status: Abnormal   Collection Time: 09/30/24  1:33 AM   Specimen: Anterior Nasal Swab  Result Value Ref Range Status   SARS Coronavirus 2 by RT PCR POSITIVE (A) NEGATIVE Final   Influenza A by PCR NEGATIVE NEGATIVE Final    Influenza B by PCR NEGATIVE NEGATIVE Final    Comment: (NOTE) The Xpert Xpress SARS-CoV-2/FLU/RSV plus assay is intended as an aid in the diagnosis of  influenza from Nasopharyngeal swab specimens and should not be used as a sole basis for treatment. Nasal washings and aspirates are unacceptable for Xpert Xpress SARS-CoV-2/FLU/RSV testing.  Fact Sheet for Patients: BloggerCourse.com  Fact Sheet for Healthcare Providers: SeriousBroker.it  This test is not yet approved or cleared by the [place]  FDA and has been authorized for detection and/or diagnosis of SARS-CoV-2 by FDA under an Emergency Use Authorization (EUA). This EUA will remain in effect (meaning this test can be used) for the duration of the COVID-19 declaration under Section 564(b)(1) of the Act, 21 U.S.C. section 360bbb-3(b)(1), unless the authorization is terminated or revoked.     Resp Syncytial Virus by PCR NEGATIVE NEGATIVE Final    Comment: (NOTE) Fact Sheet for Patients: BloggerCourse.com  Fact Sheet for Healthcare Providers: SeriousBroker.it  This test is not yet approved or cleared by the [place]  FDA and has been authorized for detection and/or diagnosis of SARS-CoV-2 by FDA under an Emergency Use Authorization (EUA). This EUA will remain in effect (meaning this test can be used) for the duration of the COVID-19 declaration under Section 564(b)(1) of the Act, 21 U.S.C. section 360bbb-3(b)(1), unless the authorization is terminated or revoked.  Performed at [facility], [address] [place], [place] [address]      Radiological Exams on Admission: MR BRAIN WO CONTRAST Result Date: 09/29/2024 EXAM: MR Brain without Intravenous Contrast. CLINICAL HISTORY: Neuro deficit, acute, stroke suspected; Dizziness. TECHNIQUE: Magnetic resonance images of the brain without intravenous contrast in  multiple planes. CONTRAST: Without. COMPARISON: Same day CT head FINDINGS: BRAIN: No restricted diffusion to indicate acute infarction. No intracranial mass or hemorrhage. No midline shift or extra-axial fluid collection. No cerebellar tonsillar ectopia. The central arterial and venous flow voids are patent. VENTRICLES: No hydrocephalus. ORBITS: The orbits are normal. SINUSES AND MASTOIDS: The sinuses and mastoid air cells are clear. BONES: No acute fracture or focal osseous lesion. IMPRESSION: 1. Unremarkable brain MRI. Electronically signed by: Gilmore Molt MD 09/29/2024 09:14 PM EDT RP Workstation: [id number]   CT Head Wo Contrast Result Date: 09/29/2024 EXAM: CT HEAD WITHOUT CONTRAST 09/29/2024 12:35:00 PM TECHNIQUE: CT of the head was performed without the administration of intravenous contrast. Automated exposure control, iterative reconstruction, and/or weight based adjustment of the mA/kV was utilized to reduce the radiation dose to as low as reasonably achievable. COMPARISON: MRI 10/21/2023 and head CT 10/21/2023. CLINICAL HISTORY: 63 year old female with syncope/presyncope, fall, and suspected cerebrovascular cause. FINDINGS: BRAIN AND VENTRICLES: No acute hemorrhage. No evidence of acute infarct. No hydrocephalus. No extra-axial collection. No mass effect or midline shift. Normal for age noncontrast CT appearance of the brain. Calcified atherosclerosis at the skull base. No suspicious intracranial vascular hyperdensity. ORBITS: No acute abnormality. SINUSES: No acute abnormality. SOFT TISSUES AND SKULL: Mild posterior left superior scalp convexity soft tissue swelling and stranding compatible with mild superficial hematoma/contusion on series 268. Underlying calvarium intact. No soft tissue gas. IMPRESSION: 1. Mild left superior scalp superficial hematoma/contusion.  Calvarium intact. 2. Normal for age non-contrast CT appearance of the brain. Electronically signed by: Helayne Hurst MD 09/29/2024 01:10  PM EDT RP Workstation: [id number]   DG Foot Complete Right Result Date: 09/29/2024 CLINICAL DATA:  Right foot pain after fall last night. EXAM: RIGHT FOOT COMPLETE - 3+ VIEW COMPARISON:  None Available. FINDINGS: Mildly displaced oblique fracture is seen involving the distal fourth metatarsal. Joint spaces are unremarkable. Soft tissues are unremarkable. IMPRESSION: Mildly displaced distal fourth metatarsal fracture. Electronically Signed  By: Lynwood Landy Raddle M.D.   On: 09/29/2024 13:04   DG Knee Complete 4 Views Left Result Date: 09/29/2024 CLINICAL DATA:  Left knee pain after fall last night. EXAM: LEFT KNEE - COMPLETE 4+ VIEW COMPARISON:  None Available. FINDINGS: No evidence of fracture, dislocation, or joint effusion. No evidence of arthropathy or other focal bone abnormality. Soft tissues are unremarkable. IMPRESSION: Negative. Electronically Signed   By: Lynwood Landy Raddle M.D.   On: 09/29/2024 13:02    EKG: Independently reviewed. Sinus rhythm.   Assessment/Plan   1. Syncope  - Presents after 2 episodes of brief LOC with fall yesterday, also had one ~2 wks ago, all shortly after rising from seated position and all without prodrome  - Check orthostatic vitals, continue cardiac monitoring, and check echocardiogram    2. Right 4th metatarsal fracture  - Mildly displaced distal 4th metatarsal fx noted on x-ray in ED  - Plan for immobilization and progressive weight-bearing   3. COVID  - She developed rhinorrhea and change in taste on 10/4 and positive for COVID on home test  - Symptoms have resolved  - Isolate while in hospital for 10 days from the positive test    4. CKD 3A  - SCr is 1.36 on admission; appear slightly up from baseline  - Renally-dose medications    5. Hypertension  - She reports some recent low BPs at home, stopped taking chlorthalidone, and has been only taking 50 mg losartan  instead of 100 mg as prescribed  - Continue to hold chlorthalidone, hold losartan  for  now, continue propranalol as tolerated     6. Hypokalemia; hypomagnesemia   - Replaced in ED  - Repeat chem panel in am    7. Depression, anxiety, insomnia  - Continue Wellbutrin , as-needed Xanax , and as-needed Ambien     DVT prophylaxis: Lovenox   Code Status: Full  Level of Care: Level of care: Telemetry Medical Family Communication: None present  Disposition Plan:  Patient is from: Home  Anticipated d/c is to: Home  Anticipated d/c date is: 10/8 or 10/01/24  Patient currently: Pending orthostatic vitals, cardiac monitoring, and echocardiogram  Consults called: None  Admission status: Observation     Evalene GORMAN Sprinkles, MD [facility]  09/30/2024, 2:53 AM

## 2024-09-30 NOTE — Subjective & Objective (Signed)
 Pt seen and examined. Right 4th metatarsal head fracture. Has covid. Feels fatigued. MRI brain negative for CVA.  Echo shows LVEF  45-50% but endocardial borders suboptimally defined.  Scr increased from 1.05 in 04-2024 to 1.42 as of 09-26-2024.  Pt has been started on multiple HTN agents including chlorthalidone and losartan  since 04-2024.

## 2024-09-30 NOTE — Progress Notes (Signed)
 PROGRESS NOTE    [name]  FMW:[id number] DOB: 09-25-1961 DOA: 09/29/2024 PCP: Windy Coy, MD  Subjective: Pt seen and examined. Right 4th metatarsal head fracture. Has covid. Feels fatigued. MRI brain negative for CVA.  Echo shows LVEF  45-50% but endocardial borders suboptimally defined.  Scr increased from 1.05 in 04-2024 to 1.42 as of 09-26-2024.  Pt has been started on multiple HTN agents including chlorthalidone and losartan  since 04-2024.   Hospital Course: CC: syncope, fall HPI: [name] is a 63 y.o. female with medical history significant for hypertension, CKD 3A, depression, anxiety, insomnia, and prior episodes of strokelike symptoms with negative MRIs who presents with multiple complaints including recurrent episodes syncope.    Patient reports that she was sitting down almost 2 weeks ago at a pickleball court, stood up, and then found herself seated on the ground with tailbone pain and inability to remember falling.  She then had 2 episodes yesterday, both shortly after rising from a seated position, and both without any preceding symptoms or warning.  She denies any recent chest pain or palpitations.  She also complains of weeks of intermittent word finding difficulty.  She denies any focal numbness or weakness.  She has also been experiencing right foot pain and bruising after one of the falls yesterday.   ED Course: Upon arrival to the ED, patient is found to be afebrile and saturating well on room air with normal HR and stable BP.  Labs are most notable for potassium 3.3, magnesium  1.5, creatinine 1.36, normal WBC, normal hemoglobin, and undetectable troponin x 2.  EKG demonstrates sinus rhythm.  MRI brain was unremarkable.  Plain radiographs of the right knee are negative for acute fracture or dislocation.  X-ray of the right foot reveals mildly displaced distal fourth metatarsal fracture.   Patient was treated in the ED with 1 L LR, 40 mEq oral potassium, and  2 g IV magnesium .  Significant Events: Admitted 09/29/2024 for syncope   Admission Labs: WBC 8.5, HgB 12.6, plt219 Mg 1.5 Na 134, K 3.3, CO2 of 27, BUN 29, Scr 1.36, glu 109 T. Prot 6.8 Alb 3.4 AST 34, ALT 31, alk phos 55, t. Bili 0.8 Covid POSITIVE, RSV neg, flu negative  Admission Imaging Studies: Left knee XR No evidence of fracture, dislocation, or joint effusion. No evidence of arthropathy or other focal bone abnormality. Soft tissues are unremarkable. Right Foot XR Mildly displaced oblique fracture is seen involving the distal fourth metatarsal. Joint spaces are unremarkable. Soft tissues are unremarkable. CT head Mild left superior scalp superficial hematoma/contusion.  Calvarium intact. 2. Normal for age non-contrast CT appearance of the brain. MRI brain No restricted diffusion to indicate acute infarction. No intracranial mass or hemorrhage. No midline shift or extra-axial fluid collection. No cerebellar tonsillar ectopia. The central arterial and venous flow voids are patent.  Significant Labs:   Significant Imaging Studies:   Antibiotic Therapy: Anti-infectives (From admission, onward)    None       Procedures:   Consultants:      Assessment and Plan: * Syncope 09/30/24 likely due to covid, dehydration. MRI brain negative for CVA. Not sure what to make of her slightly decreased LVEF of 45-50% compared to 2020 LVEF of >60%.  ??HTN cardiomyopathy vs poor echo windows. Will refer to outpatient cards for consult. Discussed with pt.   COVID-19 virus infection 09/30/24 not hypoxia. Continue supportive care.  Closed fracture of fourth metatarsal bone of right foot 09/30/24 weight bearing at  tolerated. Work excuse written for 2 weeks. Pt already has hard sole shoe at home. Pt already has opiate Rx at home.  Hypokalemia 09/30/24 repleted with po kcl. Stopping chlorthalidone at discharge   Hypomagnesemia-resolved as of 09/30/2024 09/30/24 repleted. Mg 2.1  today. Resolved.   CKD stage 3a, GFR 45-59 ml/min (HCC) 09/30/24 unclear if pt truly has CKD vs effects of diuretic/ARB. Will hold diuretic/ARB. Needs repeat renal function panel with outpatient nephrology visit.   Insomnia 09/30/24 on ambien    Anxiety 09/30/24 continue xanax  and wellbutrin    Hyperlipidemia 09/30/24 sent lipid panel. Pt states she is going to see provider at lipid clinic.   Depression 09/30/24 continue wellbutrin    Essential hypertension 09/30/24 unclear if ARB plus hydrochlorothiazide  the cause of her elevated Scr vs CKD. Will stop ARB and hydrochlorothiazide . Continue inderal to prevent betablocker withdrawal tachycardia. Outpatient nephrology consult. Will need repeat renal function panel at outpatient nephrology visit.    DVT prophylaxis:   Lovenox    Code Status: Full Code Family Communication: pt is decisional. Disposition Plan: home Reason for continuing need for hospitalization: stable for DC.  Objective: Vitals:   09/30/24 0159 09/30/24 0354 09/30/24 0818 09/30/24 1200  BP: 136/78 111/74 (!) 140/89 (!) 140/90  Pulse: (!) 55 63 65 67  Resp:   20 20  Temp: (!) 97.5 F (36.4 C) (!) 97.5 F (36.4 C) 97.8 F (36.6 C) 98.4 F (36.9 C)  TempSrc: Oral Oral    SpO2: 100% 98% 100% 99%  Weight:      Height:        Intake/Output Summary (Last 24 hours) at 09/30/2024 1243 Last data filed at 09/30/2024 0947 Gross per 24 hour  Intake 300 ml  Output --  Net 300 ml   Filed Weights   09/29/24 1035  Weight: 54.4 kg    Examination:  Physical Exam Vitals and nursing note reviewed.  Constitutional:      General: She is not in acute distress.    Appearance: She is normal weight. She is not toxic-appearing or diaphoretic.  HENT:     Head: Normocephalic and atraumatic.  Eyes:     General: No scleral icterus. Cardiovascular:     Rate and Rhythm: Normal rate and regular rhythm.  Pulmonary:     Effort: Pulmonary effort is normal. No  respiratory distress.     Breath sounds: Normal breath sounds.  Abdominal:     General: Abdomen is flat. Bowel sounds are normal. There is no distension.     Palpations: Abdomen is soft.     Tenderness: There is no abdominal tenderness.  Skin:    General: Skin is warm and dry.     Capillary Refill: Capillary refill takes less than 2 seconds.     Comments: Bruising over dorsum of right foot.  Neurological:     General: No focal deficit present.     Mental Status: She is alert and oriented to person, place, and time.     Data Reviewed: I have personally reviewed following labs and imaging studies  CBC: Recent Labs  Lab 09/26/24 1709 09/29/24 1050 09/30/24 0410  WBC 8.8 8.5 5.8  NEUTROABS  --  6.5  --   HGB 13.7 12.6 12.4  HCT 41.2 38.0 35.7*  MCV 103.0* 101.9* 99.7  PLT 207 219 201   Basic Metabolic Panel: Recent Labs  Lab 09/26/24 1709 09/29/24 1050 09/30/24 0410  NA 133* 134* 138  K 3.4* 3.3* 3.4*  CL 92* 92* 100  CO2  31 27 28   GLUCOSE 100* 109* 107*  BUN 43* 29* 31*  CREATININE 1.42* 1.36* 1.26*  CALCIUM  10.3 8.8* 9.0  MG  --  1.5* 2.1   GFR: Estimated Creatinine Clearance: 37.3 mL/min (A) (by C-G formula based on SCr of 1.26 mg/dL (H)). Liver Function Tests: Recent Labs  Lab 09/26/24 1709 09/29/24 1050  AST 32 34  ALT 25 31  ALKPHOS 68 55  BILITOT 0.5 0.8  PROT 7.9 6.8  ALBUMIN 4.6 3.4*   Recent Labs  Lab 09/26/24 1709  LIPASE 60*   Recent Results (from the past 240 hours)  Resp panel by RT-PCR (RSV, Flu A&B, Covid) Anterior Nasal Swab     Status: Abnormal   Collection Time: 09/30/24  1:33 AM   Specimen: Anterior Nasal Swab  Result Value Ref Range Status   SARS Coronavirus 2 by RT PCR POSITIVE (A) NEGATIVE Final   Influenza A by PCR NEGATIVE NEGATIVE Final   Influenza B by PCR NEGATIVE NEGATIVE Final    Comment: (NOTE) The Xpert Xpress SARS-CoV-2/FLU/RSV plus assay is intended as an aid in the diagnosis of influenza from Nasopharyngeal  swab specimens and should not be used as a sole basis for treatment. Nasal washings and aspirates are unacceptable for Xpert Xpress SARS-CoV-2/FLU/RSV testing.  Fact Sheet for Patients: BloggerCourse.com  Fact Sheet for Healthcare Providers: SeriousBroker.it  This test is not yet approved or cleared by the [place]  FDA and has been authorized for detection and/or diagnosis of SARS-CoV-2 by FDA under an Emergency Use Authorization (EUA). This EUA will remain in effect (meaning this test can be used) for the duration of the COVID-19 declaration under Section 564(b)(1) of the Act, 21 U.S.C. section 360bbb-3(b)(1), unless the authorization is terminated or revoked.     Resp Syncytial Virus by PCR NEGATIVE NEGATIVE Final    Comment: (NOTE) Fact Sheet for Patients: BloggerCourse.com  Fact Sheet for Healthcare Providers: SeriousBroker.it  This test is not yet approved or cleared by the [place]  FDA and has been authorized for detection and/or diagnosis of SARS-CoV-2 by FDA under an Emergency Use Authorization (EUA). This EUA will remain in effect (meaning this test can be used) for the duration of the COVID-19 declaration under Section 564(b)(1) of the Act, 21 U.S.C. section 360bbb-3(b)(1), unless the authorization is terminated or revoked.  Performed at [facility], [address], [place], [place] [address]      Radiology Studies: ECHOCARDIOGRAM COMPLETE Result Date: 09/30/2024    ECHOCARDIOGRAM REPORT   Patient Name:   [name] Date of Exam: 09/30/2024 Medical Rec #:  [id number]         Height:       63.0 in Accession #:    [id number]        Weight:       120.0 lb Date of Birth:  [birth year]          BSA:          1.556 m Patient Age:    63 years          BP:           111/74 mmHg Patient Gender: F                 HR:           57 bpm. Exam Location:   Inpatient Procedure: 2D Echo, Color Doppler and Cardiac Doppler (Both Spectral and Color  Flow Doppler were utilized during procedure). Indications:    Syncope R55  History:        Patient has prior history of Echocardiogram examinations, most                 recent 06/19/2018.  Sonographer:    Tinnie Gosling RDCS Referring Phys: [phone] TIMOTHY S OPYD IMPRESSIONS  1. Left ventricular ejection fraction, by estimation, is 45 to 50%. The left ventricle has mildly decreased function. Left ventricular endocardial border not optimally defined to evaluate regional wall motion. Left ventricular diastolic parameters were normal.  2. Right ventricular systolic function is low normal. The right ventricular size is normal. Tricuspid regurgitation signal is inadequate for assessing PA pressure.  3. The mitral valve is normal in structure. No evidence of mitral valve regurgitation. No evidence of mitral stenosis.  4. The aortic valve is normal in structure. Aortic valve regurgitation is not visualized. No aortic stenosis is present.  5. The inferior vena cava is normal in size with greater than 50% respiratory variability, suggesting right atrial pressure of 3 mmHg. Comparison(s): A prior study was performed on 01/15/2019. The ejection fraction was 60-65% and there was a trace pericardial effusion. Conclusion(s)/Recommendation(s): Consider repeat limited echo with Definity contrast for better endocardial definition. FINDINGS  Left Ventricle: Left ventricular ejection fraction, by estimation, is 45 to 50%. The left ventricle has mildly decreased function. Left ventricular endocardial border not optimally defined to evaluate regional wall motion. The left ventricular internal cavity size was normal in size. There is no left ventricular hypertrophy. Left ventricular diastolic parameters were normal. Right Ventricle: The right ventricular size is normal. No increase in right ventricular wall thickness. Right ventricular  systolic function is low normal. Tricuspid regurgitation signal is inadequate for assessing PA pressure. Left Atrium: Left atrial size was normal in size. Right Atrium: Right atrial size was normal in size. Pericardium: There is no evidence of pericardial effusion. Mitral Valve: The mitral valve is normal in structure. No evidence of mitral valve regurgitation. No evidence of mitral valve stenosis. Tricuspid Valve: The tricuspid valve is normal in structure. Tricuspid valve regurgitation is not demonstrated. No evidence of tricuspid stenosis. Aortic Valve: The aortic valve is normal in structure. Aortic valve regurgitation is not visualized. No aortic stenosis is present. Pulmonic Valve: The pulmonic valve was normal in structure. Pulmonic valve regurgitation is not visualized. No evidence of pulmonic stenosis. Aorta: The aortic root is normal in size and structure. Venous: The inferior vena cava is normal in size with greater than 50% respiratory variability, suggesting right atrial pressure of 3 mmHg. IAS/Shunts: No atrial level shunt detected by color flow Doppler.  LEFT VENTRICLE PLAX 2D LVIDd:         3.70 cm   Diastology LVIDs:         2.90 cm   LV e' medial:    7.72 cm/s LV PW:         1.00 cm   LV E/e' medial:  7.0 LV IVS:        1.00 cm   LV e' lateral:   8.59 cm/s LVOT diam:     2.10 cm   LV E/e' lateral: 6.3 LV SV:         67 LV SV Index:   43 LVOT Area:     3.46 cm  RIGHT VENTRICLE            IVC RV S prime:     7.62 cm/s  IVC diam: 1.00  cm TAPSE (M-mode): 2.1 cm LEFT ATRIUM           Index LA diam:      3.00 cm 1.93 cm/m LA Vol (A4C): 21.0 ml 13.49 ml/m  AORTIC VALVE LVOT Vmax:   82.20 cm/s LVOT Vmean:  60.900 cm/s LVOT VTI:    0.192 m  AORTA Ao Root diam: 3.00 cm Ao Asc diam:  2.90 cm MITRAL VALVE MV Area (PHT): 3.48 cm    SHUNTS MV Decel Time: 218 msec    Systemic VTI:  0.19 m MV E velocity: 54.00 cm/s  Systemic Diam: 2.10 cm MV A velocity: 58.70 cm/s MV E/A ratio:  0.92 Emeline Calender Electronically  signed by Emeline Calender Signature Date/Time: 09/30/2024/11:29:23 AM    Final    MR BRAIN WO CONTRAST Result Date: 09/29/2024 EXAM: MR Brain without Intravenous Contrast. CLINICAL HISTORY: Neuro deficit, acute, stroke suspected; Dizziness. TECHNIQUE: Magnetic resonance images of the brain without intravenous contrast in multiple planes. CONTRAST: Without. COMPARISON: Same day CT head FINDINGS: BRAIN: No restricted diffusion to indicate acute infarction. No intracranial mass or hemorrhage. No midline shift or extra-axial fluid collection. No cerebellar tonsillar ectopia. The central arterial and venous flow voids are patent. VENTRICLES: No hydrocephalus. ORBITS: The orbits are normal. SINUSES AND MASTOIDS: The sinuses and mastoid air cells are clear. BONES: No acute fracture or focal osseous lesion. IMPRESSION: 1. Unremarkable brain MRI. Electronically signed by: Gilmore Molt MD 09/29/2024 09:14 PM EDT RP Workstation: [id number]   CT Head Wo Contrast Result Date: 09/29/2024 EXAM: CT HEAD WITHOUT CONTRAST 09/29/2024 12:35:00 PM TECHNIQUE: CT of the head was performed without the administration of intravenous contrast. Automated exposure control, iterative reconstruction, and/or weight based adjustment of the mA/kV was utilized to reduce the radiation dose to as low as reasonably achievable. COMPARISON: MRI 10/21/2023 and head CT 10/21/2023. CLINICAL HISTORY: 63 year old female with syncope/presyncope, fall, and suspected cerebrovascular cause. FINDINGS: BRAIN AND VENTRICLES: No acute hemorrhage. No evidence of acute infarct. No hydrocephalus. No extra-axial collection. No mass effect or midline shift. Normal for age noncontrast CT appearance of the brain. Calcified atherosclerosis at the skull base. No suspicious intracranial vascular hyperdensity. ORBITS: No acute abnormality. SINUSES: No acute abnormality. SOFT TISSUES AND SKULL: Mild posterior left superior scalp convexity soft tissue swelling and stranding  compatible with mild superficial hematoma/contusion on series 268. Underlying calvarium intact. No soft tissue gas. IMPRESSION: 1. Mild left superior scalp superficial hematoma/contusion.  Calvarium intact. 2. Normal for age non-contrast CT appearance of the brain. Electronically signed by: Helayne Hurst MD 09/29/2024 01:10 PM EDT RP Workstation: [id number]   DG Foot Complete Right Result Date: 09/29/2024 CLINICAL DATA:  Right foot pain after fall last night. EXAM: RIGHT FOOT COMPLETE - 3+ VIEW COMPARISON:  None Available. FINDINGS: Mildly displaced oblique fracture is seen involving the distal fourth metatarsal. Joint spaces are unremarkable. Soft tissues are unremarkable. IMPRESSION: Mildly displaced distal fourth metatarsal fracture. Electronically Signed   By: Lynwood Landy Raddle M.D.   On: 09/29/2024 13:04   DG Knee Complete 4 Views Left Result Date: 09/29/2024 CLINICAL DATA:  Left knee pain after fall last night. EXAM: LEFT KNEE - COMPLETE 4+ VIEW COMPARISON:  None Available. FINDINGS: No evidence of fracture, dislocation, or joint effusion. No evidence of arthropathy or other focal bone abnormality. Soft tissues are unremarkable. IMPRESSION: Negative. Electronically Signed   By: Lynwood Landy Raddle M.D.   On: 09/29/2024 13:02    Scheduled Meds:  buPROPion   300 mg Oral q morning  propranolol ER  60 mg Oral q morning   rosuvastatin   40 mg Oral Daily   sodium chloride  flush  3 mL Intravenous Q12H   Continuous Infusions:   LOS: 0 days   Time spent: 55 minutes  Camellia Door, DO  [facility]  09/30/2024, 12:43 PM

## 2024-09-30 NOTE — Assessment & Plan Note (Signed)
 09/30/24 on ambien 

## 2024-09-30 NOTE — Assessment & Plan Note (Signed)
 09/30/24 continue wellbutrin 

## 2024-09-30 NOTE — Assessment & Plan Note (Signed)
 09/30/24 sent lipid panel. Pt states she is going to see provider at lipid clinic.

## 2024-09-30 NOTE — Evaluation (Signed)
 Physical Therapy Evaluation Patient Details Name: [name] MRN: [id number] DOB: Mar 11, 1961 Today's Date: 09/30/2024  History of Present Illness  [name] is a 63 y.o. female presenting with recurrent syncope episodes, 3 falls, tailbone pain, and R foot pain. Imaging revealed R 4th metatasal fx. pt also found to be COVID+. PMH: hypertension, CKD 3A, depression, anxiety, insomnia, and prior episodes of strokelike symptoms with negative MRIs, has been taking GLP-1   Clinical Impression  Pt admitted with above. 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 Pt now with R 4th metatarsal fx, pain in tail bone, and expresses concerns regarding her BP and that causing her syncopal episodes. Per patient, she is WBing at tolerate in her R foot with post op shoe however no orders in chart at this time regarding WBing or the need for post op shoe. Pt eager to mobilize. Pt able to use RW and amb in room with increased UE dependency to off-weight R foot during stance phase as well as WBing through R heel vs whole foot. Pt able to ambulate 30', stand at sink to complete ADLs, and transfer self in/out of bed. Recommend patient asking for assist with transportation and grocery shopping as her driving foot is broken. Pt reports I will be able to drive myself home but Ill have someone deliver my groceries. I don't want my son coming because I have COVID and he just had a baby. Acute PT to cont to follow while in hospital however I dont feel she will need follow up PT regarding these injuries. Pt reports going to outpt PT 2x/wk for L hip strengthening and balance work prior to hospitalization. Encouraged pt to resume once cleared by MD.       If plan is discharge home, recommend the following: Assist for transportation (reports son can help her grocery shop)   Can travel by private vehicle        Equipment Recommendations None recommended by PT   Recommendations for Other Services       Functional Status Assessment Patient has had a recent decline in their functional status and demonstrates the ability to make significant improvements in function in a reasonable and predictable amount of time.     Precautions / Restrictions Precautions Precautions: Fall Recall of Precautions/Restrictions: Intact Precaution/Restrictions Comments: no orders in chart, per patient she is to wear a post-op shoe on R foot for 4th metarsal fx Required Braces or Orthoses: Other Brace (post op shoe, pt reports having one at home) Restrictions Weight Bearing Restrictions Per Provider Order: No Other Position/Activity Restrictions: no orders in chart, RLE WBAT in post op shoe per patient      Mobility  Bed Mobility Overal bed mobility: Independent             General bed mobility comments: HOB elevated, increased time, log roll due to inability to lay on back due to pain in buttocks    Transfers Overall transfer level: Modified independent Equipment used: Rolling walker (2 wheels)               General transfer comment: increased time, verbal cues to use atleast one hand to push up from bed/chair/toliet vs pushing up from RW    Ambulation/Gait Ambulation/Gait assistance: Modified independent (Device/Increase time) Gait Distance (Feet): 30 Feet Assistive device: Rolling walker (2 wheels) Gait Pattern/deviations: Step-to pattern, Decreased stride length, Decreased stance time - right, Decreased step length - left  Gait velocity: dec Gait velocity interpretation: <1.8 ft/sec, indicate of risk for recurrent falls   General Gait Details: decreased R LE WBing tolerance, pt reports only WBing through heal, increased UE dependency to off weight R LE due to pain in foot with WBing  Stairs Stairs:  (discussed up with the good, down with the bad. pt reports I'll go through the back where there are no stairs)          Wheelchair  Mobility     Tilt Bed    Modified Rankin (Stroke Patients Only)       Balance Overall balance assessment: Needs assistance (pt able) Sitting-balance support: Feet supported, Bilateral upper extremity supported Sitting balance-Leahy Scale: Fair Sitting balance - Comments: limited sitting tolerance due to tailbone pain   Standing balance support: Single extremity supported, During functional activity Standing balance-Leahy Scale: Fair Standing balance comment: unilateral UE support when brushing teeth and performing peri care at sink                             Pertinent Vitals/Pain Pain Assessment Pain Assessment: 0-10 Pain Score: 6  Pain Location: tailbone Pain Descriptors / Indicators: Sore Pain Intervention(s): Limited activity within patient's tolerance    Home Living Family/patient expects to be discharged to:: Private residence Living Arrangements: Alone Available Help at Discharge: Family;Available PRN/intermittently Type of Home: House Home Access: Stairs to enter Entrance Stairs-Rails: None Entrance Stairs-Number of Steps: 2     Home Equipment: Agricultural consultant (2 wheels);Cane - single point;Shower seat      Prior Function Prior Level of Function : Independent/Modified Independent             Mobility Comments: indep, no AD, plays pickleball, works as a Airline pilot ADLs Comments: indep     Extremity/Trunk Assessment   Upper Extremity Assessment Upper Extremity Assessment: Overall WFL for tasks assessed    Lower Extremity Assessment Lower Extremity Assessment: RLE deficits/detail RLE Deficits / Details: ROM WFL at hip, knee, ankle, limited toe mobility as expected due to fx    Cervical / Trunk Assessment Cervical / Trunk Assessment: Normal  Communication   Communication Communication: No apparent difficulties    Cognition Arousal: Alert Behavior During Therapy: Anxious   PT - Cognitive impairments: No family/caregiver  present to determine baseline                       PT - Cognition Comments: pt anxious regarding care and medicine but suspect this to be baseline Following commands: Intact       Cueing Cueing Techniques: Verbal cues     General Comments General comments (skin integrity, edema, etc.): BP 140/89 at beginning of session, 133/94 post session (30 min later)    Exercises     Assessment/Plan    PT Assessment Patient needs continued PT services  PT Problem List Decreased strength;Decreased range of motion;Decreased activity tolerance;Decreased balance;Decreased mobility;Decreased cognition;Decreased coordination       PT Treatment Interventions      PT Goals (Current goals can be found in the Care Plan section)  Acute Rehab PT Goals Patient Stated Goal: home PT Goal Formulation: With patient Time For Goal Achievement: 10/14/24 Potential to Achieve Goals: Good    Frequency Min 3X/week     Co-evaluation               AM-PAC PT 6 Clicks Mobility  Outcome Measure Help needed turning  from your back to your side while in a flat bed without using bedrails?: None Help needed moving from lying on your back to sitting on the side of a flat bed without using bedrails?: None Help needed moving to and from a bed to a chair (including a wheelchair)?: None Help needed standing up from a chair using your arms (e.g., wheelchair or bedside chair)?: None Help needed to walk in hospital room?: A Little Help needed climbing 3-5 steps with a railing? : A Little 6 Click Score: 22    End of Session Equipment Utilized During Treatment: Gait belt Activity Tolerance: Patient limited by pain Patient left: in bed;with call bell/phone within reach;with bed alarm set (in sidelying) Nurse Communication: Mobility status (stable BP) PT Visit Diagnosis: Unsteadiness on feet (R26.81);Difficulty in walking, not elsewhere classified (R26.2)    Time: 9198-9144 PT Time Calculation (min)  (ACUTE ONLY): 54 min   Charges:   PT Evaluation $PT Eval Moderate Complexity: 1 Mod PT Treatments $Gait Training: 8-22 mins $Therapeutic Activity: 23-37 mins PT General Charges $$ ACUTE PT VISIT: 1 Visit         Norene Ames, PT, DPT Acute Rehabilitation Services Secure chat preferred Office #: [phone]   Norene CHRISTELLA Ames 09/30/2024, 9:10 AM

## 2024-09-30 NOTE — Assessment & Plan Note (Signed)
 09/30/24 likely due to covid, dehydration. MRI brain negative for CVA. Not sure what to make of her slightly decreased LVEF of 45-50% compared to 2020 LVEF of >60%.  ??HTN cardiomyopathy vs poor echo windows. Will refer to outpatient cards for consult. Discussed with pt.

## 2024-09-30 NOTE — Assessment & Plan Note (Signed)
 09/30/24 unclear if ARB plus hydrochlorothiazide  the cause of her elevated Scr vs CKD. Will stop ARB and hydrochlorothiazide . Continue inderal to prevent betablocker withdrawal tachycardia. Outpatient nephrology consult. Will need repeat renal function panel at outpatient nephrology visit.

## 2024-09-30 NOTE — Assessment & Plan Note (Signed)
 09/30/24 repleted with po kcl. Stopping chlorthalidone at discharge

## 2024-09-30 NOTE — Discharge Summary (Signed)
 [facility] Physician Discharge Summary   [name]  Admit date:     09/29/2024  Discharge date: 09/30/2024  Attending Physician: CHARLTON EVALENE RAMAN [[id number]]  Discharge Physician: Camellia Door   PCP: Windy Coy, MD  Admitted From: Home  Disposition:  Home  Recommendations for Outpatient Follow-up:  Follow up with PCP in 1-2 weeks Outpatient cardiology referral made to review echo Outpatient nephrology referral made for HTN management and evaluation for CKD Please follow up on the following pending results: lipid panel  Home Health:No Equipment/Devices: None  Discharge Condition:Stable CODE STATUS:FULL Diet recommendation: Regular Fluid Restriction: None  Hospital Summary: CC: syncope, fall HPI: [name] is a 63 y.o. female with medical history significant for hypertension, CKD 3A, depression, anxiety, insomnia, and prior episodes of strokelike symptoms with negative MRIs who presents with multiple complaints including recurrent episodes syncope.    Patient reports that she was sitting down almost 2 weeks ago at a pickleball court, stood up, and then found herself seated on the ground with tailbone pain and inability to remember falling.  She then had 2 episodes yesterday, both shortly after rising from a seated position, and both without any preceding symptoms or warning.  She denies any recent chest pain or palpitations.  She also complains of weeks of intermittent word finding difficulty.  She denies any focal numbness or weakness.  She has also been experiencing right foot pain and bruising after one of the falls yesterday.   ED Course: Upon arrival to the ED, patient is found to be afebrile and saturating well on room air with normal HR and stable BP.  Labs are most notable for potassium 3.3, magnesium  1.5, creatinine 1.36, normal WBC, normal hemoglobin, and undetectable troponin x 2.  EKG demonstrates sinus rhythm.  MRI brain was  unremarkable.  Plain radiographs of the right knee are negative for acute fracture or dislocation.  X-ray of the right foot reveals mildly displaced distal fourth metatarsal fracture.   Patient was treated in the ED with 1 L LR, 40 mEq oral potassium, and 2 g IV magnesium .  Significant Events: Admitted 09/29/2024 for syncope   Admission Labs: WBC 8.5, HgB 12.6, plt219 Mg 1.5 Na 134, K 3.3, CO2 of 27, BUN 29, Scr 1.36, glu 109 T. Prot 6.8 Alb 3.4 AST 34, ALT 31, alk phos 55, t. Bili 0.8 Covid POSITIVE, RSV neg, flu negative  Admission Imaging Studies: Left knee XR No evidence of fracture, dislocation, or joint effusion. No evidence of arthropathy or other focal bone abnormality. Soft tissues are unremarkable. Right Foot XR Mildly displaced oblique fracture is seen involving the distal fourth metatarsal. Joint spaces are unremarkable. Soft tissues are unremarkable. CT head Mild left superior scalp superficial hematoma/contusion.  Calvarium intact. 2. Normal for age non-contrast CT appearance of the brain. MRI brain No restricted diffusion to indicate acute infarction. No intracranial mass or hemorrhage. No midline shift or extra-axial fluid collection. No cerebellar tonsillar ectopia. The central arterial and venous flow voids are patent.  Significant Labs:   Significant Imaging Studies:   Antibiotic Therapy: Anti-infectives (From admission, onward)    None       Procedures:   Consultants:     Hospital Course by Problem: * Syncope 09/30/24 likely due to covid, dehydration. MRI brain negative for CVA. Not sure what to make of her slightly decreased LVEF of 45-50% compared to 2020 LVEF of >60%.  ??HTN cardiomyopathy vs poor echo windows. Will refer to outpatient cards  for consult. Discussed with pt.   COVID-19 virus infection 09/30/24 not hypoxia. Continue supportive care.  Closed fracture of fourth metatarsal bone of right foot 09/30/24 weight bearing at tolerated.  Work excuse written for 2 weeks. Pt already has hard sole shoe at home. Pt already has opiate Rx at home.  Hypokalemia 09/30/24 repleted with po kcl. Stopping chlorthalidone at discharge   Hypomagnesemia-resolved as of 09/30/2024 09/30/24 repleted. Mg 2.1 today. Resolved.   CKD stage 3a, GFR 45-59 ml/min (HCC) 09/30/24 unclear if pt truly has CKD vs effects of diuretic/ARB. Will hold diuretic/ARB. Needs repeat renal function panel with outpatient nephrology visit.   Insomnia 09/30/24 on ambien    Anxiety 09/30/24 continue xanax  and wellbutrin    Hyperlipidemia 09/30/24 sent lipid panel. Pt states she is going to see provider at lipid clinic.   Depression 09/30/24 continue wellbutrin    Essential hypertension 09/30/24 unclear if ARB plus hydrochlorothiazide  the cause of her elevated Scr vs CKD. Will stop ARB and hydrochlorothiazide . Continue inderal to prevent betablocker withdrawal tachycardia. Outpatient nephrology consult. Will need repeat renal function panel at outpatient nephrology visit.   Discharge Diagnoses:  Principal Problem:   Syncope Active Problems:   Hypokalemia   Closed fracture of fourth metatarsal bone of right foot   COVID-19 virus infection   Essential hypertension   Depression   Hyperlipidemia   Anxiety   Insomnia   CKD stage 3a, GFR 45-59 ml/min [facility])   Discharge Instructions  Discharge Instructions     Ambulatory referral to Cardiology   Complete by: As directed    Recent echo with slightly decreased LVEF of 45-50% compared to 12-2018. [place] cardiologist suggested repeat limited echo with definity contrast.   Ambulatory referral to Nephrology   Complete by: As directed    Call MD for:  difficulty breathing, headache or visual disturbances   Complete by: As directed    Call MD for:  extreme fatigue   Complete by: As directed    Call MD for:  hives   Complete by: As directed    Call MD for:  persistant dizziness or light-headedness    Complete by: As directed    Call MD for:  persistant nausea and vomiting   Complete by: As directed    Call MD for:  redness, tenderness, or signs of infection (pain, swelling, redness, odor or green/yellow discharge around incision site)   Complete by: As directed    Call MD for:  severe uncontrolled pain   Complete by: As directed    Call MD for:  temperature >100.4   Complete by: As directed    Diet general   Complete by: As directed    Discharge instructions   Complete by: As directed    1. Follow up with your primary care provider in 1-2 weeks following discharge from hospital. 2. Outpatient cardiology referral made to repeat limited echocardiogram 3. Outpatient nephrology referral made for Hypertension management.   Increase activity slowly   Complete by: As directed       Allergies as of 09/30/2024       Reactions   Ancef  [cefazolin ] Hives   Cause rash shortness of breath and vomiting    Penicillins Hives, Other (See Comments)   Has patient had a PCN reaction causing immediate rash, facial/tongue/throat swelling, SOB or lightheadedness with hypotension: Yes Has patient had a PCN reaction causing severe rash involving mucus membranes or skin necrosis:Yes Has patient had a PCN reaction that required hospitalization:No Has patient had a PCN reaction  occurring within the last 10 years: No If all of the above answers are NO, then may proceed with Cephalosporin use.   Ceftin [cefuroxime] Nausea And Vomiting   Gabapentin  Other (See Comments)   Jittery, anxious        Medication List     STOP taking these medications    chlorthalidone 25 MG tablet Commonly known as: HYGROTON   losartan  100 MG tablet Commonly known as: COZAAR        TAKE these medications    ALPRAZolam  0.5 MG tablet Commonly known as: XANAX  Take 0.5 mg by mouth daily as needed for anxiety.   buPROPion  300 MG 24 hr tablet Commonly known as: WELLBUTRIN  XL Take 300 mg by mouth every morning.    ezetimibe  10 MG tablet Commonly known as: Zetia  Take 1 tablet (10 mg total) by mouth daily.   methocarbamol  500 MG tablet Commonly known as: ROBAXIN  Take 500 mg by mouth 2 (two) times daily.   ondansetron  8 MG disintegrating tablet Commonly known as: ZOFRAN -ODT Take 1 tablet (8 mg total) by mouth every 8 (eight) hours as needed for nausea or vomiting.   OVER THE COUNTER MEDICATION Take 1 Scoop by mouth 4 (four) times a week. VITAL PROTEINS COLLAGEN PEPTIDE POWDER   oxyCODONE  5 MG immediate release tablet Commonly known as: Oxy IR/ROXICODONE  Take by mouth.   propranolol ER 80 MG 24 hr capsule Commonly known as: INDERAL LA Take 80 mg by mouth every morning.   rosuvastatin  40 MG tablet Commonly known as: CRESTOR  Take 40 mg by mouth daily.   zolpidem  5 MG tablet Commonly known as: AMBIEN  Take 5 mg by mouth at bedtime as needed for sleep.        Allergies  Allergen Reactions   Ancef  [Cefazolin ] Hives    Cause rash shortness of breath and vomiting    Penicillins Hives and Other (See Comments)    Has patient had a PCN reaction causing immediate rash, facial/tongue/throat swelling, SOB or lightheadedness with hypotension: Yes Has patient had a PCN reaction causing severe rash involving mucus membranes or skin necrosis:Yes Has patient had a PCN reaction that required hospitalization:No Has patient had a PCN reaction occurring within the last 10 years: No If all of the above answers are NO, then may proceed with Cephalosporin use.    Ceftin [Cefuroxime] Nausea And Vomiting   Gabapentin  Other (See Comments)    Jittery, anxious    Discharge Exam: Vitals:   09/30/24 0818 09/30/24 1200  BP: (!) 140/89 (!) 140/90  Pulse: 65 67  Resp: 20 20  Temp: 97.8 F (36.6 C) 98.4 F (36.9 C)  SpO2: 100% 99%    Physical Exam Vitals and nursing note reviewed.  Constitutional:      General: She is not in acute distress.    Appearance: She is normal weight. She is not  toxic-appearing or diaphoretic.  HENT:     Head: Normocephalic and atraumatic.  Eyes:     General: No scleral icterus. Cardiovascular:     Rate and Rhythm: Normal rate and regular rhythm.  Pulmonary:     Effort: Pulmonary effort is normal. No respiratory distress.     Breath sounds: Normal breath sounds.  Abdominal:     General: Abdomen is flat. Bowel sounds are normal. There is no distension.     Palpations: Abdomen is soft.     Tenderness: There is no abdominal tenderness.  Skin:    General: Skin is warm and dry.  Capillary Refill: Capillary refill takes less than 2 seconds.     Comments: Bruising over dorsum of right foot.  Neurological:     General: No focal deficit present.     Mental Status: She is alert and oriented to person, place, and time.     The results of significant diagnostics from this hospitalization (including imaging, microbiology, ancillary and laboratory) are listed below for reference.    Microbiology: Recent Results (from the past 240 hours)  Resp panel by RT-PCR (RSV, Flu A&B, Covid) Anterior Nasal Swab     Status: Abnormal   Collection Time: 09/30/24  1:33 AM   Specimen: Anterior Nasal Swab  Result Value Ref Range Status   SARS Coronavirus 2 by RT PCR POSITIVE (A) NEGATIVE Final   Influenza A by PCR NEGATIVE NEGATIVE Final   Influenza B by PCR NEGATIVE NEGATIVE Final    Comment: (NOTE) The Xpert Xpress SARS-CoV-2/FLU/RSV plus assay is intended as an aid in the diagnosis of influenza from Nasopharyngeal swab specimens and should not be used as a sole basis for treatment. Nasal washings and aspirates are unacceptable for Xpert Xpress SARS-CoV-2/FLU/RSV testing.  Fact Sheet for Patients: BloggerCourse.com  Fact Sheet for Healthcare Providers: SeriousBroker.it  This test is not yet approved or cleared by the [place]  FDA and has been authorized for detection and/or diagnosis of SARS-CoV-2  by FDA under an Emergency Use Authorization (EUA). This EUA will remain in effect (meaning this test can be used) for the duration of the COVID-19 declaration under Section 564(b)(1) of the Act, 21 U.S.C. section 360bbb-3(b)(1), unless the authorization is terminated or revoked.     Resp Syncytial Virus by PCR NEGATIVE NEGATIVE Final    Comment: (NOTE) Fact Sheet for Patients: BloggerCourse.com  Fact Sheet for Healthcare Providers: SeriousBroker.it  This test is not yet approved or cleared by the [place]  FDA and has been authorized for detection and/or diagnosis of SARS-CoV-2 by FDA under an Emergency Use Authorization (EUA). This EUA will remain in effect (meaning this test can be used) for the duration of the COVID-19 declaration under Section 564(b)(1) of the Act, 21 U.S.C. section 360bbb-3(b)(1), unless the authorization is terminated or revoked.  Performed at [facility], [address], [place], [place] [address]      Labs: Basic Metabolic Panel: Recent Labs  Lab 09/26/24 1709 09/29/24 1050 09/30/24 0410  NA 133* 134* 138  K 3.4* 3.3* 3.4*  CL 92* 92* 100  CO2 31 27 28   GLUCOSE 100* 109* 107*  BUN 43* 29* 31*  CREATININE 1.42* 1.36* 1.26*  CALCIUM  10.3 8.8* 9.0  MG  --  1.5* 2.1   Liver Function Tests: Recent Labs  Lab 09/26/24 1709 09/29/24 1050  AST 32 34  ALT 25 31  ALKPHOS 68 55  BILITOT 0.5 0.8  PROT 7.9 6.8  ALBUMIN 4.6 3.4*   Recent Labs  Lab 09/26/24 1709  LIPASE 60*   No results for input(s): AMMONIA in the last 168 hours. CBC: Recent Labs  Lab 09/26/24 1709 09/29/24 1050 09/30/24 0410  WBC 8.8 8.5 5.8  NEUTROABS  --  6.5  --   HGB 13.7 12.6 12.4  HCT 41.2 38.0 35.7*  MCV 103.0* 101.9* 99.7  PLT 207 219 201   Cardiac Enzymes: Recent Labs  Lab 09/29/24 1050 09/29/24 1356  TROPONINIHS <2 <2   Urinalysis    Component Value Date/Time   COLORURINE COLORLESS  (A) 09/26/2024 1706   APPEARANCEUR CLEAR 09/26/2024 1706  LABSPEC 1.009 09/26/2024 1706   PHURINE 6.0 09/26/2024 1706   GLUCOSEU NEGATIVE 09/26/2024 1706   HGBUR NEGATIVE 09/26/2024 1706   BILIRUBINUR NEGATIVE 09/26/2024 1706   BILIRUBINUR N 04/27/2013 0944   KETONESUR NEGATIVE 09/26/2024 1706   PROTEINUR NEGATIVE 09/26/2024 1706   UROBILINOGEN negative 04/27/2013 0944   UROBILINOGEN 0.2 09/23/2008 1004   NITRITE NEGATIVE 09/26/2024 1706   LEUKOCYTESUR NEGATIVE 09/26/2024 1706   Sepsis Labs Recent Labs  Lab 09/26/24 1709 09/29/24 1050 09/30/24 0410  WBC 8.8 8.5 5.8    Procedures/Studies: ECHOCARDIOGRAM COMPLETE Result Date: 09/30/2024    ECHOCARDIOGRAM REPORT   Patient Name:   SULAY BRYMER Date of Exam: 09/30/2024 Medical Rec #:  [id number]         Height:       63.0 in Accession #:    [id number]        Weight:       120.0 lb Date of Birth:  1961-02-29          BSA:          1.556 m Patient Age:    63 years          BP:           111/74 mmHg Patient Gender: F                 HR:           57 bpm. Exam Location:  Inpatient Procedure: 2D Echo, Color Doppler and Cardiac Doppler (Both Spectral and Color            Flow Doppler were utilized during procedure). Indications:    Syncope R55  History:        Patient has prior history of Echocardiogram examinations, most                 recent 06/19/2018.  Sonographer:    Tinnie Gosling RDCS Referring Phys: [phone] TIMOTHY S OPYD IMPRESSIONS  1. Left ventricular ejection fraction, by estimation, is 45 to 50%. The left ventricle has mildly decreased function. Left ventricular endocardial border not optimally defined to evaluate regional wall motion. Left ventricular diastolic parameters were normal.  2. Right ventricular systolic function is low normal. The right ventricular size is normal. Tricuspid regurgitation signal is inadequate for assessing PA pressure.  3. The mitral valve is normal in structure. No evidence of mitral valve regurgitation. No  evidence of mitral stenosis.  4. The aortic valve is normal in structure. Aortic valve regurgitation is not visualized. No aortic stenosis is present.  5. The inferior vena cava is normal in size with greater than 50% respiratory variability, suggesting right atrial pressure of 3 mmHg. Comparison(s): A prior study was performed on 01/15/2019. The ejection fraction was 60-65% and there was a trace pericardial effusion. Conclusion(s)/Recommendation(s): Consider repeat limited echo with Definity contrast for better endocardial definition. FINDINGS  Left Ventricle: Left ventricular ejection fraction, by estimation, is 45 to 50%. The left ventricle has mildly decreased function. Left ventricular endocardial border not optimally defined to evaluate regional wall motion. The left ventricular internal cavity size was normal in size. There is no left ventricular hypertrophy. Left ventricular diastolic parameters were normal. Right Ventricle: The right ventricular size is normal. No increase in right ventricular wall thickness. Right ventricular systolic function is low normal. Tricuspid regurgitation signal is inadequate for assessing PA pressure. Left Atrium: Left atrial size was normal in size. Right Atrium: Right atrial size was normal in size. Pericardium: There is  no evidence of pericardial effusion. Mitral Valve: The mitral valve is normal in structure. No evidence of mitral valve regurgitation. No evidence of mitral valve stenosis. Tricuspid Valve: The tricuspid valve is normal in structure. Tricuspid valve regurgitation is not demonstrated. No evidence of tricuspid stenosis. Aortic Valve: The aortic valve is normal in structure. Aortic valve regurgitation is not visualized. No aortic stenosis is present. Pulmonic Valve: The pulmonic valve was normal in structure. Pulmonic valve regurgitation is not visualized. No evidence of pulmonic stenosis. Aorta: The aortic root is normal in size and structure. Venous: The  inferior vena cava is normal in size with greater than 50% respiratory variability, suggesting right atrial pressure of 3 mmHg. IAS/Shunts: No atrial level shunt detected by color flow Doppler.  LEFT VENTRICLE PLAX 2D LVIDd:         3.70 cm   Diastology LVIDs:         2.90 cm   LV e' medial:    7.72 cm/s LV PW:         1.00 cm   LV E/e' medial:  7.0 LV IVS:        1.00 cm   LV e' lateral:   8.59 cm/s LVOT diam:     2.10 cm   LV E/e' lateral: 6.3 LV SV:         67 LV SV Index:   43 LVOT Area:     3.46 cm  RIGHT VENTRICLE            IVC RV S prime:     7.62 cm/s  IVC diam: 1.00 cm TAPSE (M-mode): 2.1 cm LEFT ATRIUM           Index LA diam:      3.00 cm 1.93 cm/m LA Vol (A4C): 21.0 ml 13.49 ml/m  AORTIC VALVE LVOT Vmax:   82.20 cm/s LVOT Vmean:  60.900 cm/s LVOT VTI:    0.192 m  AORTA Ao Root diam: 3.00 cm Ao Asc diam:  2.90 cm MITRAL VALVE MV Area (PHT): 3.48 cm    SHUNTS MV Decel Time: 218 msec    Systemic VTI:  0.19 m MV E velocity: 54.00 cm/s  Systemic Diam: 2.10 cm MV A velocity: 58.70 cm/s MV E/A ratio:  0.92 Emeline Calender Electronically signed by Emeline Calender Signature Date/Time: 09/30/2024/11:29:23 AM    Final    MR BRAIN WO CONTRAST Result Date: 09/29/2024 EXAM: MR Brain without Intravenous Contrast. CLINICAL HISTORY: Neuro deficit, acute, stroke suspected; Dizziness. TECHNIQUE: Magnetic resonance images of the brain without intravenous contrast in multiple planes. CONTRAST: Without. COMPARISON: Same day CT head FINDINGS: BRAIN: No restricted diffusion to indicate acute infarction. No intracranial mass or hemorrhage. No midline shift or extra-axial fluid collection. No cerebellar tonsillar ectopia. The central arterial and venous flow voids are patent. VENTRICLES: No hydrocephalus. ORBITS: The orbits are normal. SINUSES AND MASTOIDS: The sinuses and mastoid air cells are clear. BONES: No acute fracture or focal osseous lesion. IMPRESSION: 1. Unremarkable brain MRI. Electronically signed by: Gilmore Molt  MD 09/29/2024 09:14 PM EDT RP Workstation: [id number]   CT Head Wo Contrast Result Date: 09/29/2024 EXAM: CT HEAD WITHOUT CONTRAST 09/29/2024 12:35:00 PM TECHNIQUE: CT of the head was performed without the administration of intravenous contrast. Automated exposure control, iterative reconstruction, and/or weight based adjustment of the mA/kV was utilized to reduce the radiation dose to as low as reasonably achievable. COMPARISON: MRI 10/21/2023 and head CT 10/21/2023. CLINICAL HISTORY: 63 year old female with syncope/presyncope, fall, and  suspected cerebrovascular cause. FINDINGS: BRAIN AND VENTRICLES: No acute hemorrhage. No evidence of acute infarct. No hydrocephalus. No extra-axial collection. No mass effect or midline shift. Normal for age noncontrast CT appearance of the brain. Calcified atherosclerosis at the skull base. No suspicious intracranial vascular hyperdensity. ORBITS: No acute abnormality. SINUSES: No acute abnormality. SOFT TISSUES AND SKULL: Mild posterior left superior scalp convexity soft tissue swelling and stranding compatible with mild superficial hematoma/contusion on series 268. Underlying calvarium intact. No soft tissue gas. IMPRESSION: 1. Mild left superior scalp superficial hematoma/contusion.  Calvarium intact. 2. Normal for age non-contrast CT appearance of the brain. Electronically signed by: Helayne Hurst MD 09/29/2024 01:10 PM EDT RP Workstation: [id number]   DG Foot Complete Right Result Date: 09/29/2024 CLINICAL DATA:  Right foot pain after fall last night. EXAM: RIGHT FOOT COMPLETE - 3+ VIEW COMPARISON:  None Available. FINDINGS: Mildly displaced oblique fracture is seen involving the distal fourth metatarsal. Joint spaces are unremarkable. Soft tissues are unremarkable. IMPRESSION: Mildly displaced distal fourth metatarsal fracture. Electronically Signed   By: Lynwood Landy Raddle M.D.   On: 09/29/2024 13:04   DG Knee Complete 4 Views Left Result Date: 09/29/2024 CLINICAL  DATA:  Left knee pain after fall last night. EXAM: LEFT KNEE - COMPLETE 4+ VIEW COMPARISON:  None Available. FINDINGS: No evidence of fracture, dislocation, or joint effusion. No evidence of arthropathy or other focal bone abnormality. Soft tissues are unremarkable. IMPRESSION: Negative. Electronically Signed   By: Lynwood Landy Raddle M.D.   On: 09/29/2024 13:02    Time coordinating discharge: 60 mins  SIGNED:  Camellia Door, DO [facility] 09/30/24, 12:45 PM

## 2024-09-30 NOTE — Plan of Care (Signed)

## 2024-09-30 NOTE — Hospital Course (Signed)
 CC: syncope, fall HPI: [name] is a 63 y.o. female with medical history significant for hypertension, CKD 3A, depression, anxiety, insomnia, and prior episodes of strokelike symptoms with negative MRIs who presents with multiple complaints including recurrent episodes syncope.    Patient reports that she was sitting down almost 2 weeks ago at a pickleball court, stood up, and then found herself seated on the ground with tailbone pain and inability to remember falling.  She then had 2 episodes yesterday, both shortly after rising from a seated position, and both without any preceding symptoms or warning.  She denies any recent chest pain or palpitations.  She also complains of weeks of intermittent word finding difficulty.  She denies any focal numbness or weakness.  She has also been experiencing right foot pain and bruising after one of the falls yesterday.   ED Course: Upon arrival to the ED, patient is found to be afebrile and saturating well on room air with normal HR and stable BP.  Labs are most notable for potassium 3.3, magnesium  1.5, creatinine 1.36, normal WBC, normal hemoglobin, and undetectable troponin x 2.  EKG demonstrates sinus rhythm.  MRI brain was unremarkable.  Plain radiographs of the right knee are negative for acute fracture or dislocation.  X-ray of the right foot reveals mildly displaced distal fourth metatarsal fracture.   Patient was treated in the ED with 1 L LR, 40 mEq oral potassium, and 2 g IV magnesium .  Significant Events: Admitted 09/29/2024 for syncope   Admission Labs: WBC 8.5, HgB 12.6, plt219 Mg 1.5 Na 134, K 3.3, CO2 of 27, BUN 29, Scr 1.36, glu 109 T. Prot 6.8 Alb 3.4 AST 34, ALT 31, alk phos 55, t. Bili 0.8 Covid POSITIVE, RSV neg, flu negative  Admission Imaging Studies: Left knee XR No evidence of fracture, dislocation, or joint effusion. No evidence of arthropathy or other focal bone abnormality. Soft tissues are unremarkable. Right Foot  XR Mildly displaced oblique fracture is seen involving the distal fourth metatarsal. Joint spaces are unremarkable. Soft tissues are unremarkable. CT head Mild left superior scalp superficial hematoma/contusion.  Calvarium intact. 2. Normal for age non-contrast CT appearance of the brain. MRI brain No restricted diffusion to indicate acute infarction. No intracranial mass or hemorrhage. No midline shift or extra-axial fluid collection. No cerebellar tonsillar ectopia. The central arterial and venous flow voids are patent.  Significant Labs:   Significant Imaging Studies:   Antibiotic Therapy: Anti-infectives (From admission, onward)    None       Procedures:   Consultants:

## 2024-09-30 NOTE — Assessment & Plan Note (Signed)
 09/30/24 repleted. Mg 2.1 today. Resolved.

## 2024-09-30 NOTE — Assessment & Plan Note (Signed)
 09/30/24 unclear if pt truly has CKD vs effects of diuretic/ARB. Will hold diuretic/ARB. Needs repeat renal function panel with outpatient nephrology visit.

## 2024-09-30 NOTE — Assessment & Plan Note (Signed)
 09/30/24 weight bearing at tolerated. Work excuse written for 2 weeks. Pt already has hard sole shoe at home. Pt already has opiate Rx at home.

## 2024-09-30 NOTE — Assessment & Plan Note (Signed)
 09/30/24 not hypoxia. Continue supportive care.

## 2024-09-30 NOTE — Progress Notes (Signed)
  Echocardiogram 2D Echocardiogram has been performed.  Tinnie FORBES Gosling RDCS 09/30/2024, 11:08 AM

## 2024-10-01 ENCOUNTER — Other Ambulatory Visit

## 2024-10-09 DIAGNOSIS — M879 Osteonecrosis, unspecified: Secondary | ICD-10-CM | POA: Insufficient documentation

## 2024-10-12 ENCOUNTER — Telehealth: Payer: Self-pay

## 2024-10-12 NOTE — Telephone Encounter (Signed)
 Patient called, recording voicemail not set up. If patient returns the call, she will need to f/u with her PCP regarding this referral. It's in the patient's chart by Dr. Laurence for the referral.    Copied from CRM [id number]. Topic: Referral - Question >> Oct 12, 2024  4:44 PM Debby BROCKS wrote: Reason for CRM: Patient was given a referral to see Windle Lye for nephrology. Dr. Camellia Laurence provided the referral but when she contacts the location to make an appointment, they advised her that there is no referral in place. Patient would like to speak to a nurse to see how else she can go about scheduling.

## 2024-10-15 ENCOUNTER — Other Ambulatory Visit (HOSPITAL_COMMUNITY): Payer: Self-pay

## 2024-10-15 ENCOUNTER — Emergency Department (HOSPITAL_BASED_OUTPATIENT_CLINIC_OR_DEPARTMENT_OTHER)
Admission: EM | Admit: 2024-10-15 | Discharge: 2024-10-15 | Disposition: A | Discharge status: Home / Self Care | Attending: Emergency Medicine | Admitting: Emergency Medicine

## 2024-10-15 ENCOUNTER — Emergency Department (HOSPITAL_BASED_OUTPATIENT_CLINIC_OR_DEPARTMENT_OTHER)

## 2024-10-15 ENCOUNTER — Telehealth: Payer: Self-pay | Admitting: Pharmacy Technician

## 2024-10-15 ENCOUNTER — Other Ambulatory Visit: Payer: Self-pay

## 2024-10-15 ENCOUNTER — Ambulatory Visit (INDEPENDENT_AMBULATORY_CARE_PROVIDER_SITE_OTHER): Admitting: Internal Medicine

## 2024-10-15 ENCOUNTER — Encounter (HOSPITAL_BASED_OUTPATIENT_CLINIC_OR_DEPARTMENT_OTHER): Payer: Self-pay | Admitting: *Deleted

## 2024-10-15 ENCOUNTER — Encounter (HOSPITAL_BASED_OUTPATIENT_CLINIC_OR_DEPARTMENT_OTHER): Payer: Self-pay

## 2024-10-15 VITALS — BP 130/80 | HR 89 | Ht 63.0 in | Wt 120.0 lb

## 2024-10-15 DIAGNOSIS — R10A2 Flank pain, left side: Secondary | ICD-10-CM | POA: Insufficient documentation

## 2024-10-15 DIAGNOSIS — I10 Essential (primary) hypertension: Secondary | ICD-10-CM | POA: Diagnosis not present

## 2024-10-15 DIAGNOSIS — R7401 Elevation of levels of liver transaminase levels: Secondary | ICD-10-CM

## 2024-10-15 DIAGNOSIS — E785 Hyperlipidemia, unspecified: Secondary | ICD-10-CM

## 2024-10-15 DIAGNOSIS — Z79899 Other long term (current) drug therapy: Secondary | ICD-10-CM | POA: Diagnosis not present

## 2024-10-15 DIAGNOSIS — Z7982 Long term (current) use of aspirin: Secondary | ICD-10-CM | POA: Insufficient documentation

## 2024-10-15 LAB — URINALYSIS, ROUTINE W REFLEX MICROSCOPIC
Bacteria, UA: NONE SEEN
Bilirubin Urine: NEGATIVE
Glucose, UA: NEGATIVE mg/dL
Ketones, ur: 15 mg/dL — AB
Leukocytes,Ua: NEGATIVE
Nitrite: NEGATIVE
Protein, ur: 100 mg/dL — AB
Specific Gravity, Urine: 1.022 (ref 1.005–1.030)
pH: 5.5 (ref 5.0–8.0)

## 2024-10-15 LAB — COMPREHENSIVE METABOLIC PANEL WITH GFR
ALT: 70 U/L — ABNORMAL HIGH (ref 0–44)
AST: 47 U/L — ABNORMAL HIGH (ref 15–41)
Albumin: 4.6 g/dL (ref 3.5–5.0)
Alkaline Phosphatase: 101 U/L (ref 38–126)
Anion gap: 16 — ABNORMAL HIGH (ref 5–15)
BUN: 33 mg/dL — ABNORMAL HIGH (ref 8–23)
CO2: 26 mmol/L (ref 22–32)
Calcium: 10.1 mg/dL (ref 8.9–10.3)
Chloride: 95 mmol/L — ABNORMAL LOW (ref 98–111)
Creatinine, Ser: 1.36 mg/dL — ABNORMAL HIGH (ref 0.44–1.00)
GFR, Estimated: 43 mL/min — ABNORMAL LOW (ref >60)
Glucose, Bld: 92 mg/dL (ref 70–99)
Potassium: 4.4 mmol/L (ref 3.5–5.1)
Sodium: 137 mmol/L (ref 135–145)
Total Bilirubin: 0.9 mg/dL (ref 0.0–1.2)
Total Protein: 7.7 g/dL (ref 6.5–8.1)

## 2024-10-15 LAB — CBC
HCT: 40.5 % (ref 36.0–46.0)
Hemoglobin: 13.5 g/dL (ref 12.0–15.0)
MCH: 33.8 pg (ref 26.0–34.0)
MCHC: 33.3 g/dL (ref 30.0–36.0)
MCV: 101.5 fL — ABNORMAL HIGH (ref 80.0–100.0)
Platelets: 219 K/uL (ref 150–400)
RBC: 3.99 MIL/uL (ref 3.87–5.11)
RDW: 13 % (ref 11.5–15.5)
WBC: 6.7 K/uL (ref 4.0–10.5)
nRBC: 0 % (ref 0.0–0.2)

## 2024-10-15 LAB — LIPASE, BLOOD: Lipase: 42 U/L (ref 11–51)

## 2024-10-15 MED ORDER — REPATHA SURECLICK 140 MG/ML ~~LOC~~ SOAJ: 140.0000 mg | SUBCUTANEOUS | 3 refills | Status: DC

## 2024-10-15 MED ORDER — SODIUM CHLORIDE 0.9 % IV BOLUS
1000.0000 mL | Freq: Once | INTRAVENOUS | Status: AC
  Administered 2024-10-15: 1000 mL via INTRAVENOUS

## 2024-10-15 MED ORDER — HYDROMORPHONE HCL 1 MG/ML IJ SOLN
1.0000 mg | Freq: Once | INTRAMUSCULAR | Status: AC
  Administered 2024-10-15: 1 mg via INTRAVENOUS
  Filled 2024-10-15: qty 1

## 2024-10-15 MED ORDER — MORPHINE SULFATE (PF) 4 MG/ML IV SOLN
4.0000 mg | Freq: Once | INTRAVENOUS | Status: AC
  Administered 2024-10-15: 4 mg via INTRAVENOUS
  Filled 2024-10-15: qty 1

## 2024-10-15 MED ORDER — ONDANSETRON 4 MG PO TBDP: 4.0000 mg | ORAL_TABLET | Freq: Three times a day (TID) | ORAL | 0 refills | Status: DC | PRN

## 2024-10-15 MED ORDER — KETOROLAC TROMETHAMINE 30 MG/ML IJ SOLN
30.0000 mg | Freq: Once | INTRAMUSCULAR | Status: AC
  Administered 2024-10-15: 30 mg via INTRAVENOUS
  Filled 2024-10-15: qty 1

## 2024-10-15 MED ORDER — REPATHA SURECLICK 140 MG/ML ~~LOC~~ SOAJ: 140.0000 mg | SUBCUTANEOUS | 3 refills | Status: AC

## 2024-10-15 MED ORDER — ASPIRIN 81 MG PO TBEC: 81.0000 mg | DELAYED_RELEASE_TABLET | Freq: Every day | ORAL | Status: AC

## 2024-10-15 MED ORDER — ONDANSETRON HCL 4 MG/2ML IJ SOLN
4.0000 mg | Freq: Once | INTRAMUSCULAR | Status: AC
  Administered 2024-10-15: 4 mg via INTRAVENOUS
  Filled 2024-10-15: qty 2

## 2024-10-15 MED ORDER — OXYCODONE-ACETAMINOPHEN 5-325 MG PO TABS: 1.0000 | ORAL_TABLET | Freq: Four times a day (QID) | ORAL | 0 refills | Status: DC | PRN

## 2024-10-15 NOTE — ED Notes (Signed)
 Reviewed AVS/discharge instructions with patient. Time allotted for and all questions answered. Patient is agreeable for d/c and escorted to ED exit by staff.

## 2024-10-15 NOTE — Telephone Encounter (Signed)
 Hi, can someone send in the repatha prescription again to [facility]? I just called and they said their system crashed and the prescription is now gone.

## 2024-10-15 NOTE — Telephone Encounter (Signed)
 Repatha Rx sent to [facility], as requested by PA team.  Will send this message to our PA team to make them aware of this.

## 2024-10-15 NOTE — Telephone Encounter (Signed)
   Ran test claim for repatha. For a 28 day supply and the co-pay is 0.00 . PA is not needed at this time. Nothing saying this is a transition fill.   This test claim was processed through [facility]- copay amounts may vary at other pharmacies due to pharmacy/plan contracts, or as the patient moves through the different stages of their insurance plan.      I called [facility] and asked them to run it on her Scientist, research (physical sciences) said the pharmacist needs to call me back around 1

## 2024-10-15 NOTE — ED Provider Notes (Signed)
 [place] EMERGENCY DEPARTMENT AT [facility] Provider Note   CSN: [id number] Arrival date & time: 10/15/24  9052     Patient presents with: Flank Pain (Left)   [name] is a 63 y.o. female.   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  Pt developed left flank pain about 2 days ago.  It is worse today.  She has nausea, but no vomiting.  No f/c.       Prior to Admission medications   Medication Sig Start Date End Date Taking? Authorizing Provider  ondansetron  (ZOFRAN -ODT) 4 MG disintegrating tablet Take 1 tablet (4 mg total) by mouth every 8 (eight) hours as needed for nausea or vomiting. 10/15/24  Yes Dean Clarity, MD  oxyCODONE -acetaminophen  (PERCOCET/ROXICET) 5-325 MG tablet Take 1 tablet by mouth every 6 (six) hours as needed for severe pain (pain score 7-10). 10/15/24  Yes Dean Clarity, MD  ALPRAZolam  (XANAX ) 0.5 MG tablet Take 0.5 mg by mouth daily as needed for anxiety. 06/11/18   [provider]  aspirin  EC 81 MG tablet Take 1 tablet (81 mg total) by mouth daily. Swallow whole. 10/15/24   Hilty, Vinie BROCKS, MD  buPROPion  (WELLBUTRIN  XL) 300 MG 24 hr tablet Take 300 mg by mouth every morning.    [provider]  Evolocumab (REPATHA SURECLICK) 140 MG/ML SOAJ Inject 140 mg into the skin every 14 (fourteen) days. 10/15/24   Hilty, Vinie BROCKS, MD  ezetimibe  (ZETIA ) 10 MG tablet Take 1 tablet (10 mg total) by mouth daily. 08/28/18   Whitfield Raisin, NP  losartan  (COZAAR ) 50 MG tablet Take 50 mg by mouth every morning. 10/11/24   [provider]  ondansetron  (ZOFRAN -ODT) 8 MG disintegrating tablet Take 1 tablet (8 mg total) by mouth every 8 (eight) hours as needed for nausea or vomiting. 05/22/24   Dasie Faden, MD  OVER THE COUNTER MEDICATION Take 1 Scoop by mouth 4 (four) times a week. VITAL PROTEINS COLLAGEN PEPTIDE POWDER    [provider]   propranolol ER (INDERAL LA) 80 MG 24 hr capsule Take 80 mg by mouth every morning. 09/12/24   [provider]  rosuvastatin  (CRESTOR ) 40 MG tablet Take 40 mg by mouth daily. 05/30/23   [provider]  zolpidem  (AMBIEN ) 5 MG tablet Take 5 mg by mouth at bedtime as needed for sleep.    [provider]    Allergies: Ancef  [cefazolin ], Penicillins, Ceftin [cefuroxime], and Gabapentin     Review of Systems  Gastrointestinal:  Positive for nausea.  Genitourinary:  Positive for flank pain.  All other systems reviewed and are negative.   Updated Vital Signs BP 124/85   Pulse 69   Temp 98.7 F (37.1 C)   Resp 19   SpO2 96%   Physical Exam Vitals and nursing note reviewed.  Constitutional:      Appearance: Normal appearance. She is ill-appearing.  HENT:     Head: Normocephalic and atraumatic.     Right Ear: External ear normal.     Left Ear: External ear normal.     Nose: Nose normal.     Mouth/Throat:     Mouth: Mucous membranes are moist.     Pharynx: Oropharynx is clear.  Eyes:     Extraocular Movements: Extraocular movements intact.     Conjunctiva/sclera: Conjunctivae normal.     Pupils: Pupils are equal, round, and reactive to light.  Cardiovascular:  Rate and Rhythm: Normal rate and regular rhythm.     Pulses: Normal pulses.     Heart sounds: Normal heart sounds.  Pulmonary:     Effort: Pulmonary effort is normal.     Breath sounds: Normal breath sounds.  Abdominal:     General: Abdomen is flat. Bowel sounds are normal.     Palpations: Abdomen is soft.  Musculoskeletal:        General: Normal range of motion.     Cervical back: Normal range of motion and neck supple.  Skin:    General: Skin is warm.     Capillary Refill: Capillary refill takes less than 2 seconds.  Neurological:     General: No focal deficit present.     Mental Status: She is alert and oriented to person, place, and time.  Psychiatric:        Mood and Affect: Mood  normal.        Behavior: Behavior normal.     (all labs ordered are listed, but only abnormal results are displayed) Labs Reviewed  COMPREHENSIVE METABOLIC PANEL WITH GFR - Abnormal; Notable for the following components:      Result Value   Chloride 95 (*)    BUN 33 (*)    Creatinine, Ser 1.36 (*)    AST 47 (*)    ALT 70 (*)    GFR, Estimated 43 (*)    Anion gap 16 (*)    All other components within normal limits  CBC - Abnormal; Notable for the following components:   MCV 101.5 (*)    All other components within normal limits  URINALYSIS, ROUTINE W REFLEX MICROSCOPIC - Abnormal; Notable for the following components:   APPearance HAZY (*)    Hgb urine dipstick SMALL (*)    Ketones, ur 15 (*)    Protein, ur 100 (*)    All other components within normal limits  LIPASE, BLOOD    EKG: None  Radiology: CT Renal Stone Study Result Date: 10/15/2024 CLINICAL DATA:  Left flank pain EXAM: CT ABDOMEN AND PELVIS WITHOUT CONTRAST TECHNIQUE: Multidetector CT imaging of the abdomen and pelvis was performed following the standard protocol without IV contrast. RADIATION DOSE REDUCTION: This exam was performed according to the departmental dose-optimization program which includes automated exposure control, adjustment of the mA and/or kV according to patient size and/or use of iterative reconstruction technique. COMPARISON:  05/22/2024 FINDINGS: Lower chest: No acute abnormality Hepatobiliary: Small cyst peripherally in the right hepatic lobe is stable since prior study. No suspicious focal hepatic abnormality. No biliary ductal dilatation. Gallbladder unremarkable. Pancreas: No focal abnormality or ductal dilatation. Spleen: No focal abnormality.  Normal size. Adrenals/Urinary Tract: No renal or adrenal mass. No visible stones or hydronephrosis. The distal left ureter is obscured by beam hardening artifact from left hip replacement. Urinary bladder grossly unremarkable. Stomach/Bowel: Colonic  diverticulosis. No active diverticulitis. Postoperative changes in the rectum. Normal appendix. Stomach and small bowel decompressed. Vascular/Lymphatic: Aortic atherosclerosis. No evidence of aneurysm or adenopathy. Reproductive: Prior hysterectomy.  No adnexal masses. Other: No free fluid or free air. Musculoskeletal: Prior left hip replacement and postoperative changes in the lumbar spine. No acute bony abnormality. IMPRESSION: No visible urinary tract stones or hydronephrosis. No acute findings in the abdomen or pelvis. Colonic diverticulosis. Aortic atherosclerosis. Electronically Signed   By: Franky Crease M.D.   On: 10/15/2024 11:10     Procedures   Medications Ordered in the ED  morphine (PF) 4 MG/ML injection 4  mg (4 mg Intravenous Given 10/15/24 1013)  ondansetron  (ZOFRAN ) injection 4 mg (4 mg Intravenous Given 10/15/24 1013)  ketorolac  (TORADOL ) 30 MG/ML injection 30 mg (30 mg Intravenous Given 10/15/24 1013)  morphine (PF) 4 MG/ML injection 4 mg (4 mg Intravenous Given 10/15/24 1044)  sodium chloride  0.9 % bolus 1,000 mL (0 mLs Intravenous Stopped 10/15/24 1212)  HYDROmorphone  (DILAUDID ) injection 1 mg (1 mg Intravenous Given 10/15/24 1146)                                    Medical Decision Making Amount and/or Complexity of Data Reviewed Labs: ordered. Radiology: ordered.  Risk Prescription drug management.   This patient presents to the ED for concern of flank pain, this involves an extensive number of treatment options, and is a complaint that carries with it a high risk of complications and morbidity.  The differential diagnosis includes pyelo, kidney stone, msk   Co morbidities that complicate the patient evaluation  htn, hld, cva (neg mri) like sx, and recent admission from covid/dehydration/aki   Additional history obtained:  Additional history obtained from epic chart review  Lab Tests:  I Ordered, and personally interpreted labs.  The pertinent results  include:  cbc nl, cmp with bun 33 and cr 1.36 (stable); AST 47 (34 2 weeks ago, but it's been up higher in the last year); ua + hgb, ketones, no uti   Imaging Studies ordered:  I ordered imaging studies including ct renal  I independently visualized and interpreted imaging which showed  No visible urinary tract stones or hydronephrosis.    No acute findings in the abdomen or pelvis.    Colonic diverticulosis.    Aortic atherosclerosis.   I agree with the radiologist interpretation   Medicines ordered and prescription drug management:  I ordered medication including morphine/zofran /ivfs/toradol   for sx  Reevaluation of the patient after these medicines showed that the patient improved I have reviewed the patients home medicines and have made adjustments as needed   Test Considered:  Ct renal   Critical Interventions:  Pain control  Problem List / ED Course:  Flank pain:  possibly a kidney stone we can't see due to artifact from the hip.  Pain has improved.  Pt is stable for d/c.  Return if worse.  F/u with pcp.   Reevaluation:  After the interventions noted above, I reevaluated the patient and found that they have :improved   Social Determinants of Health:  Lives at home   Dispostion:  After consideration of the diagnostic results and the patients response to treatment, I feel that the patent would benefit from discharge with outpatient f/u.       Final diagnoses:  Left flank pain    ED Discharge Orders          Ordered    oxyCODONE -acetaminophen  (PERCOCET/ROXICET) 5-325 MG tablet  Every 6 hours PRN        10/15/24 1234    ondansetron  (ZOFRAN -ODT) 4 MG disintegrating tablet  Every 8 hours PRN        10/15/24 1234               Dean Clarity, MD 10/15/24 1236

## 2024-10-15 NOTE — Patient Instructions (Signed)
 Medication Instructions:   Dr. Mona recommends REPATHA (PCSK9). This is an injectable cholesterol medication self-administered once every 14 days. This medication will likely need prior approval with your insurance company, which we will work on. If the medication is not approved initially, we may need to do an appeal with your insurance. If approved, we will provide you with copay and cost information. We'll then send the prescription to your pharmacy. We would have you complete another set of fasting labs between 3-4 months to reassess cholesterol.   REPATHA is self-injected once every 14 days in subcutaneous or fatty tissue - such as belly or side/outer/upper thigh. It is best stored in the refrigerator but is stable at room temp up to 28 days. Please take the pen-injector out of fridge about 30 minutes - 1 hour prior to injection, to allow it to warm closer to room temperature.   This medication is very effective in lowering LDL and can lower LPa, as Dr. Mona mentioned. It is also generally well tolerated -- most common reaction may be cold-like symptoms such as runny nose, scratchy throat, as this is an antibody therapy. It is generally self-limiting and after a few doses, your body should have normalized to the medication.   Here is a demo video: [email or web address]   If you need a co-pay card for Repatha: Lawsponsor.fr If you need a co-pay card for Praluent: [email or web address]  Patient Assistance:    These foundations have funds at various times.   The [employer]: https://www.panfoundation.org/disease-funds/hypercholesterolemia/ -- can sign up for wait list  The [facility] offers assistance to help pay for medication copays.  They will cover copays for all cholesterol lowering meds, including statins, fibrates, omega-3 fish oils like Vascepa, ezetimibe , Repatha, Praluent, Nexletol,  Nexlizet.  The cards are usually good for $2,500 or 12 months, whichever comes first. Our fax # is [phone] (you will need this to apply) Go to healthwellfoundation.org Click on "Apply Now" Answer questions as to whom is applying (patient or representative) Your disease fund will be "hypercholesterolemia - Medicare access" They will ask questions about finances and which medications you are taking for cholesterol When you submit, the approval is usually within minutes.  You will need to print the card information from the site You will need to show this information to your pharmacy, they will bill your Medicare Part D plan first -then bill [employer] --for the copay.   You can also call them at [phone], although the hold times can be quite long.     *If you need a refill on your cardiac medications before your next appointment, please call your pharmacy*    Lab Work:  IN 3 MONTHS AT 3RD FLOOR [address]--NMR LIPOPROFILE AND LIPOPROTEIN A--PLEASE COME FASTING TO THIS LAB APPOINTMENT  If you have labs (blood work) drawn today and your tests are completely normal, you will receive your results only by: MyChart Message (if you have MyChart) OR A paper copy in the mail If you have any lab test that is abnormal or we need to change your treatment, we will call you to review the results.    Follow-Up:  AS NEEDED WITH DR. HILTY

## 2024-10-15 NOTE — ED Notes (Signed)
Labs collected in triage

## 2024-10-15 NOTE — Progress Notes (Signed)
 LIPID CLINIC CONSULT NOTE  Chief Complaint:  Manage dyslipidemia  Primary Care Physician: Windy Coy, MD  Primary Cardiologist:  Redell Shallow, MD  HPI:  [name] is a 63 y.o. female who is being seen today for the evaluation of dyslipidemia at the request of Windy Coy, MD. this a pleasant 63 year old female kindly referred for evaluation management of dyslipidemia.  She has a history of stroke about 5 years ago with a target LDL less than 70.  She has been followed closely by Dr. Windy who has noted she had persistently elevated cholesterol despite being on high intensity rosuvastatin  40 mg daily and ezetimibe  10 mg daily.  Her most recent lipid profile was September 30, 2024.  This showed total cholesterol 166, triglycerides 88, HDL 70 and LDL 78 however her target LDL should be less than 70.  Moreover she had an LP(a) performed previously which was severely elevated at 450.  In the past she also had elevated liver enzymes with AST and ALT between 100 and 200 however they have more recently normalized.  She did take a month off of statin therapy but has been now back on it for at least 2 months.  She had a recent COVID infection and a fall and was hospitalized for this.  Today she is also complaining of some left flank pain.  She reports history of alcohol use in the past but says that she has decreased that quite a bit to maybe no more than 2 glasses of wine per day.  There is a strong history of high cholesterol in her family as well as cerebrovascular disease.  She does take daily low-dose aspirin  81 mg.  PMHx:  Past Medical History:  Diagnosis Date   Adenomatous polyps    Anemia    Anxiety    Asthma    Blood transfusion without reported diagnosis 01/1999   following hysterectomy   Bulging discs    Endometriosis    History of atrial fibrillation    History of COVID-19    12/2018, 10/2020   Hx of respiratory failure    Hyperlipidemia    Hypertension     PONV (postoperative nausea and vomiting)    PVC (premature ventricular contraction)    Stroke [facility])    Urinary incontinence     Past Surgical History:  Procedure Laterality Date   5 back surgeries     ABDOMINAL HYSTERECTOMY  2001   TAH/RSO   AUGMENTATION MAMMAPLASTY Bilateral 2013   saline. pre pectoral   BLADDER SUSPENSION  2003   LSO and rectocele repair at [facility]   BREAST SURGERY     breast augmentation--saline implants   EPIDURAL BLOCK INJECTION     HERNIA REPAIR     I & D EXTREMITY Right 06/10/2021   Procedure: IRRIGATION AND DEBRIDEMENT ACHILLES;  Surgeon: Josefina Chew, MD;  Location: [facility] OR;  Service: Orthopedics;  Laterality: Right;   LUMBAR FUSION  2010   Dr. Gaither   REDUCTION MAMMAPLASTY Bilateral    REPLACEMENT TOTAL HIP W/  RESURFACING IMPLANTS  10/2021   SPINAL FUSION  2018   Dr. Colon   SPINAL FUSION  04/06/2020   SPINE SURGERY      FAMHx:  Family History  Problem Relation Age of Onset   Hypertension Mother    Stroke Mother    Hypertension Father    Cancer Father    Dementia Father    Hyperlipidemia Sister    Hypertension Sister    Stroke  Maternal Aunt    Stroke Maternal Uncle    Colon cancer Maternal Uncle 72   Colon cancer Maternal Grandmother 45   Breast cancer Maternal Grandmother 45 - 59   Stroke Paternal Grandfather    Breast cancer Cousin 56   Hyperlipidemia Brother    Hypertension Brother    Hypertension Brother    Hypertension Brother     SOCHx:   reports that she has quit smoking. Her smoking use included cigarettes. She has never used smokeless tobacco. She reports current alcohol use of about 6.0 - 8.0 standard drinks of alcohol per week. She reports that she does not currently use drugs.  ALLERGIES:  Allergies  Allergen Reactions   Ancef  [Cefazolin ] Hives    Cause rash shortness of breath and vomiting    Penicillins Hives and Other (See Comments)    Has patient had a PCN reaction causing immediate rash, facial/tongue/throat  swelling, SOB or lightheadedness with hypotension: Yes Has patient had a PCN reaction causing severe rash involving mucus membranes or skin necrosis:Yes Has patient had a PCN reaction that required hospitalization:No Has patient had a PCN reaction occurring within the last 10 years: No If all of the above answers are NO, then may proceed with Cephalosporin use.    Ceftin [Cefuroxime] Nausea And Vomiting   Gabapentin  Other (See Comments)    Jittery, anxious    ROS: Pertinent items noted in HPI and remainder of comprehensive ROS otherwise negative.  HOME MEDS: Current Outpatient Medications on File Prior to Visit  Medication Sig Dispense Refill   ALPRAZolam  (XANAX ) 0.5 MG tablet Take 0.5 mg by mouth daily as needed for anxiety.  1   buPROPion  (WELLBUTRIN  XL) 300 MG 24 hr tablet Take 300 mg by mouth every morning.     ezetimibe  (ZETIA ) 10 MG tablet Take 1 tablet (10 mg total) by mouth daily. 30 tablet 4   losartan  (COZAAR ) 50 MG tablet Take 50 mg by mouth every morning.     ondansetron  (ZOFRAN -ODT) 8 MG disintegrating tablet Take 1 tablet (8 mg total) by mouth every 8 (eight) hours as needed for nausea or vomiting. 20 tablet 0   OVER THE COUNTER MEDICATION Take 1 Scoop by mouth 4 (four) times a week. VITAL PROTEINS COLLAGEN PEPTIDE POWDER     propranolol ER (INDERAL LA) 80 MG 24 hr capsule Take 80 mg by mouth every morning.     rosuvastatin  (CRESTOR ) 40 MG tablet Take 40 mg by mouth daily.     zolpidem  (AMBIEN ) 5 MG tablet Take 5 mg by mouth at bedtime as needed for sleep.     No current facility-administered medications on file prior to visit.    LABS/IMAGING: No results found for this or any previous visit (from the past 48 hours). No results found.  LIPID PANEL:    Component Value Date/Time   CHOL 166 09/30/2024 0410   TRIG 88 09/30/2024 0410   HDL 70 09/30/2024 0410   CHOLHDL 2.4 09/30/2024 0410   VLDL 18 09/30/2024 0410   LDLCALC 78 09/30/2024 0410    No results found  for: LIPOA   WEIGHTS: Wt Readings from Last 3 Encounters:  10/15/24 120 lb (54.4 kg)  09/29/24 120 lb (54.4 kg)  09/26/24 123 lb 14.4 oz (56.2 kg)    VITALS: BP 130/80 (BP Location: Left Arm, Patient Position: Sitting, Cuff Size: Normal)   Pulse 89   Ht 5' 3 (1.6 m)   Wt 120 lb (54.4 kg)   SpO2 96%  BMI 21.26 kg/m   EXAM: Deferred  EKG: Deferred  ASSESSMENT: Dyslipidemia, goal LDL less than 70 Elevated OE(j)-549 nmol/L History of transaminitis History of stroke Regular alcohol use 0 CAC score (2023)  PLAN: 1.   [name] has a dyslipidemia with target LDL less than 70.  She has a high LP(a) and has not reached target on combination therapy with high intensity statin and ezetimibe .  He is a good candidate to add a PCSK9 inhibitor to reach her goal LDL and this may also benefit her with some reduction in LP(a).  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  She should remain on low-dose aspirin  to mitigate this.  Will reach out for prior authorization for Repatha.  Plan repeat lipids including NMR and LP(a) in about 3 to 4 months.  We will also check liver enzymes in about 2 to 4 weeks after starting therapy.  Thanks again for the kind referral.  Becky KYM Maxcy, MD, [facility], FNLA, FACP  [place]  [facility]  Medical Director of the [facility] Lipid Disorders &  Cardiovascular Risk Reduction Clinic Diplomate of the American Board of Clinical Lipidology Attending Cardiologist  Direct Dial: [phone]  Fax: [phone]  Website:  [email or web address] [name] [name] 10/15/2024, 9:01 AM

## 2024-10-15 NOTE — Addendum Note (Signed)
 Addended by: GLADIS PORTER HERO on: 10/15/2024 05:31 PM   Modules accepted: Orders

## 2024-10-15 NOTE — ED Notes (Signed)
 Patient transported to CT

## 2024-10-15 NOTE — ED Triage Notes (Signed)
 Patient reports left sided flank pain starting two days ago and currently progressing to worsening pain. She also reports some nausea. Denies any urinary symptoms. She denies a history of kidney stones. Patient appears uncomfortable.

## 2024-10-16 NOTE — Telephone Encounter (Signed)
 I called [facility] and they confirmed they received the prescription and it is on order for Monday

## 2024-10-27 ENCOUNTER — Other Ambulatory Visit (HOSPITAL_BASED_OUTPATIENT_CLINIC_OR_DEPARTMENT_OTHER): Payer: Self-pay | Admitting: *Deleted

## 2024-10-27 DIAGNOSIS — E785 Hyperlipidemia, unspecified: Secondary | ICD-10-CM

## 2024-10-27 DIAGNOSIS — R7401 Elevation of levels of liver transaminase levels: Secondary | ICD-10-CM

## 2024-11-05 NOTE — Progress Notes (Signed)
 HPI: Follow-up palpitations and dyspnea. Carotid Dopplers September 2015 showed no stenosis. 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  ETT December 2017 negative.  Monitor March 2022 showed sinus rhythm.  Calcium  score January 2023 0.  Admitted with syncopal episode October 2025 felt likely secondary to COVID infection/dehydration.  Echocardiogram October 2025 showed ejection fraction 45 to 50%.  Note I personally reviewed and felt ejection fraction was normal with ejection fraction 55 to 60%.  Since last seen patient is scheduled to have hip replacement.  She has significant right hip pain.  However she denies dyspnea on exertion, orthopnea, PND, pedal edema, exertional chest pain or syncope.  She is able to ambulate 2 to 3 miles and plays pickle ball with no symptoms.  Current Outpatient Medications  Medication Sig Dispense Refill   ALPRAZolam  (XANAX ) 0.5 MG tablet Take 0.5 mg by mouth daily as needed for anxiety.  1   amLODipine (NORVASC) 5 MG tablet Take 5 mg by mouth every morning.     aspirin  EC 81 MG tablet Take 1 tablet (81 mg total) by mouth daily. Swallow whole.     buPROPion  (WELLBUTRIN  XL) 300 MG 24 hr tablet Take 300 mg by mouth every morning.     Evolocumab  (REPATHA  SURECLICK) 140 MG/ML SOAJ Inject 140 mg into the skin every 14 (fourteen) days. 6 mL 3   ezetimibe  (ZETIA ) 10 MG tablet Take 1 tablet (10 mg total) by mouth daily. 30 tablet 4   losartan  (COZAAR ) 100 MG tablet Take 100 mg by mouth daily.     OVER THE COUNTER MEDICATION Take 1 Scoop by mouth 4 (four) times a week. VITAL PROTEINS COLLAGEN PEPTIDE POWDER     propranolol  ER (INDERAL  LA) 80 MG 24 hr capsule Take 80 mg by mouth every morning.     rosuvastatin  (CRESTOR ) 40 MG tablet Take 40 mg by mouth daily.     zolpidem  (AMBIEN ) 5 MG tablet Take 5 mg by mouth at bedtime as needed for sleep.     No current facility-administered medications for this visit.     Past Medical History:  Diagnosis Date   Adenomatous  polyps    Anemia    Anxiety    Asthma    Blood transfusion without reported diagnosis 01/1999   following hysterectomy   Bulging discs    Endometriosis    History of atrial fibrillation    History of COVID-19    12/2018, 10/2020   Hx of respiratory failure    Hyperlipidemia    Hypertension    PONV (postoperative nausea and vomiting)    PVC (premature ventricular contraction)    Stroke [facility])    Urinary incontinence     Past Surgical History:  Procedure Laterality Date   5 back surgeries     ABDOMINAL HYSTERECTOMY  2001   TAH/RSO   AUGMENTATION MAMMAPLASTY Bilateral 2013   saline. pre pectoral   BLADDER SUSPENSION  2003   LSO and rect[facility]cele repair at [facility]   BREAST SURGERY     breast augmentation--saline implants   EPIDURAL BLOCK INJECTION     HERNIA REPAIR     I & D EXTREMITY Right 06/10/2021   Procedure: IRRIGATION AND DEBRIDEMENT ACHILLES;  Surgeon: Josefina Chew, MD;  Location: [facility] OR;  Service: Orthopedics;  Laterality: Right;   LUMBAR FUSION  2010   Dr. Gaither   REDUCTION MAMMAPLASTY Bilateral    REPLACEMENT TOTAL HIP W/  RESURFACING IMPLANTS  10/2021   SPINAL FUSION  2018  Dr. Colon   SPINAL FUSION  04/06/2020   SPINE SURGERY      Social History   Socioeconomic History   Marital status: Divorced    Spouse name: Not on file   Number of children: Not on file   Years of education: Not on file   Highest education level: Not on file  Occupational History   Not on file  Tobacco Use   Smoking status: Former    Types: Cigarettes   Smokeless tobacco: Never  Vaping Use   Vaping status: Never Used  Substance and Sexual Activity   Alcohol use: Yes    Alcohol/week: 6.0 - 8.0 standard drinks of alcohol    Types: 6 - 8 Standard drinks or equivalent per week    Comment: occasionally    Drug use: Not Currently   Sexual activity: Yes    Partners: Male    Birth control/protection: Surgical    Comment: TAH  Other Topics Concern   Not on file  Social History  Narrative   Not on file   Social Drivers of Health   Financial Resource Strain: Not on file  Food Insecurity: Patient Declined (09/30/2024)   Hunger Vital Sign    Worried About Running Out of Food in the Last Year: Patient declined    Ran Out of Food in the Last Year: Patient declined  Transportation Needs: Patient Declined (09/30/2024)   PRAPARE - Administrator, Civil Service (Medical): Patient declined    Lack of Transportation (Non-Medical): Patient declined  Physical Activity: Not on file  Stress: Not on file  Social Connections: Not on file  Intimate Partner Violence: Patient Declined (09/30/2024)   Humiliation, Afraid, Rape, and Kick questionnaire    Fear of Current or Ex-Partner: Patient declined    Emotionally Abused: Patient declined    Physically Abused: Patient declined    Sexually Abused: Patient declined    Family History  Problem Relation Age of Onset   Hypertension Mother    Stroke Mother    Hypertension Father    Cancer Father    Dementia Father    Hyperlipidemia Sister    Hypertension Sister    Stroke Maternal Aunt    Stroke Maternal Uncle    Colon cancer Maternal Uncle 72   Colon cancer Maternal Grandmother 57   Breast cancer Maternal Grandmother 26 - 59   Stroke Paternal Grandfather    Breast cancer Cousin 60   Hyperlipidemia Brother    Hypertension Brother    Hypertension Brother    Hypertension Brother     ROS: no fevers or chills, productive cough, hemoptysis, dysphasia, odynophagia, melena, hematochezia, dysuria, hematuria, rash, seizure activity, orthopnea, PND, pedal edema, claudication. Remaining systems are negative.  Physical Exam: Well-developed well-nourished in no acute distress.  Skin is warm and dry.  HEENT is normal.  Neck is supple.  Chest is clear to auscultation with normal expansion.  Cardiovascular exam is regular rate and rhythm.  Abdominal exam nontender or distended. No masses palpated. Extremities show no  edema. neuro grossly intact  ECG-September 29, 2024-normal sinus rhythm with no ST changes.  Personally reviewed  A/P  1 syncope-this was felt secondary to COVID infection/dehydration.  Patient stood from the couch and had frank syncope after taking 2 steps.  She has had no recurrent episodes since that time.  Note her blood work showed she was prerenal.  I have asked her to maintain hydration.  2 palpitations-no recent symptoms.  3 hypertension-blood pressure controlled.  Continue present medical regimen.  4 hyperlipidemia-continue present medications.  Note liver functions mildly elevated recently.  If this becomes more problematic could reduce dose of Crestor  and recheck.  5 question cardiomyopathy-I have reviewed the patient's echo from October and by my review I feel the LV function is normal with ejection fraction 55 to 60%.  6 preoperative evaluation prior to hip replacement-recent echocardiogram personally reviewed and showed preserved LV function.  She has excellent functional capacity with no dyspnea on exertion or chest pain.  Recent ECG shows no ST changes.  She may proceed without further cardiac evaluation.  Redell Shallow, MD

## 2024-11-06 LAB — HEPATIC FUNCTION PANEL
ALT: 47 IU/L — ABNORMAL HIGH (ref 0–32)
AST: 45 IU/L — ABNORMAL HIGH (ref 0–40)
Albumin: 4.2 g/dL (ref 3.9–4.9)
Alkaline Phosphatase: 72 IU/L (ref 49–135)
Bilirubin Total: 0.7 mg/dL (ref 0.0–1.2)
Bilirubin, Direct: 0.23 mg/dL (ref 0.00–0.40)
Total Protein: 6.8 g/dL (ref 6.0–8.5)

## 2024-11-09 ENCOUNTER — Ambulatory Visit: Payer: Self-pay | Admitting: Internal Medicine

## 2024-11-16 ENCOUNTER — Encounter: Payer: Self-pay | Admitting: Cardiology

## 2024-11-16 ENCOUNTER — Ambulatory Visit: Attending: Cardiology | Admitting: Cardiology

## 2024-11-16 VITALS — BP 132/60 | HR 60 | Ht 63.0 in | Wt 121.0 lb

## 2024-11-16 DIAGNOSIS — E7800 Pure hypercholesterolemia, unspecified: Secondary | ICD-10-CM | POA: Diagnosis not present

## 2024-11-16 DIAGNOSIS — I10 Essential (primary) hypertension: Secondary | ICD-10-CM | POA: Diagnosis not present

## 2024-11-16 DIAGNOSIS — Z01810 Encounter for preprocedural cardiovascular examination: Secondary | ICD-10-CM | POA: Diagnosis not present

## 2024-11-16 DIAGNOSIS — R55 Syncope and collapse: Secondary | ICD-10-CM | POA: Diagnosis not present

## 2024-11-16 NOTE — Patient Instructions (Signed)

## 2024-11-17 ENCOUNTER — Encounter: Payer: Self-pay | Admitting: Obstetrics and Gynecology

## 2024-11-17 ENCOUNTER — Ambulatory Visit: Admitting: Obstetrics and Gynecology

## 2024-11-17 VITALS — BP 116/78 | HR 71 | Ht 64.75 in | Wt 124.0 lb

## 2024-11-17 DIAGNOSIS — Z1331 Encounter for screening for depression: Secondary | ICD-10-CM

## 2024-11-17 DIAGNOSIS — Z780 Asymptomatic menopausal state: Secondary | ICD-10-CM

## 2024-11-17 DIAGNOSIS — Z01419 Encounter for gynecological examination (general) (routine) without abnormal findings: Secondary | ICD-10-CM

## 2024-11-17 NOTE — Patient Instructions (Addendum)
 Phone number for scheduling bone density at [place], [phone].    Calcium in Foods Calcium is a mineral in the body. Of all the minerals in your body, you have the most calcium. Most of the body's calcium supply is stored in bones and teeth. Calcium helps many parts of the body work, including: Blood and blood vessels. Nerves. Hormones. Muscles. Bones and teeth. When your calcium stores are low, you may be at risk for low bone mass, bone loss, and broken bones. When you get enough calcium, it helps to support strong bones and teeth throughout your life. Calcium is especially important for: Children during growth spurts. Females during adolescence. Females who are pregnant or breastfeeding. Females after their menstrual cycle stops (postmenopausal). Females whose menstrual cycle has stopped because of an eating disorder or regular intense exercise. People who can't eat or digest dairy products. People who eat a vegan diet. Recommended daily amounts of calcium: Females (ages 68 to 75): 1,000 mg per day. Females (ages 35 and older): 1,200 mg per day. Males (ages 64 to 41): 1,000 mg per day. Males (ages 42 and older): 1,200 mg per day. Females (ages 48 to 39): 1,300 mg per day. Males (ages 58 to 47): 1,300 mg per day. General information Eat foods that are high in calcium. Try to get most of your calcium from food. Some people may benefit from taking calcium supplements. Check with your health care provider or an expert in healthy eating called a dietitian before starting any calcium supplements. Calcium supplements may interact with certain medicines. Too much calcium may cause other health problems, such as trouble pooping and kidney stones. For the body to absorb calcium, it needs vitamin D . Sources of vitamin D  include: Skin exposure to direct sunlight. Foods, such as egg yolks, liver, mushrooms, saltwater fish, and fortified milk. Vitamin D  supplements. Check with your  provider or dietitian before starting any vitamin D  supplements. The amount of calcium that is absorbed in the body varies with type of food. Talk to a dietitian about what foods are best for you, especially if you are eat a vegan diet or don't eat dairy. What foods are high in calcium?  Foods that are high in calcium contain more than 100 milligrams per serving. Fruits Fortified orange juice or other fruit juice, 300 mg per 8 oz (237 mL) serving. Vegetables Collard greens, 260 mg per 1 cup (130 g) serving, cooked. Kale, 180 mg per 1 cup (118 g) serving, cooked. Bok choy, 180 mg per 1 cup (170 g) serving, cooked Grains Fortified frozen waffles, 200 mg in 2 waffles. Oatmeal, 180 mg in 1 cup (234 g) serving, cooked. Fortified white bread, 175 mg per slice. Meats and other proteins Sardines, canned with bones, 350 mg per 3.75 oz (92 g) serving. Salmon, canned with bones, 168 mg per 3 oz (85 g) serving. Canned shrimp, 125 mg per 3 oz (85 g) serving. Baked beans, 120 mg per 1 cup (266 g) serving. Tofu, firm, made with calcium sulfate, 861 mg per  cup (126 g) serving. Dairy Yogurt, plain, low-fat, 448 mg per 1 cup (245 g) serving Nonfat milk, 300 mg per 1 cup (245 g) serving. American cheese, 145 mg per 1 oz (21 g) serving or 1 slice. Cheddar cheese, 200 mg per 1 oz (28 g) serving or 1 slice. Cottage cheese 2%, 125 mg per  cup (113 g) serving. Fortified soy, rice, or almond milk, 300 mg per 1 cup (237 mL) serving. Mozzarella, part  skim, 210 mg per 1 oz (21 g) serving. The items listed above may not be a complete list of foods high in calcium. Actual amounts of calcium may be different depending on processing. Contact a dietitian for more information. What foods are lower in calcium? Foods that are lower in calcium contain 50 mg or less per serving. Fruits Apple, 1 medium, about 6 mg. Banana, 1 medium, about 12 mg. Vegetables Lettuce, 19 mg per 1 cup (35 g) serving. Tomato, 1 small,  about 11 mg. Grains Rice, white, 8 mg per  cup (79 g) serving. Boiled potatoes, 14 mg per 1 cup (160 g) serving. White bread, 6 mg per slice. Meats and other proteins Egg, 24 mg per 1 egg (50 g). Red meat, 7 mg per 4 oz (80 g) serving. Chicken, 17 mg per 4 oz (113 g) serving. Fish, cod, or trout, 20 mg per 4 oz (140 g) serving. Dairy Cream cheese, regular, 14 mg per 1 Tbsp (15 g) serving. Brie cheese, 50 mg per 1 oz (32 g) serving. The items listed above may not be a complete list of foods lower in calcium. Actual amounts of calcium may be different depending on processing. Contact a dietitian for more information. This information is not intended to replace advice given to you by your health care provider. Make sure you discuss any questions you have with your health care provider. Document Revised: 09/07/2023 Document Reviewed: 09/07/2023 Elsevier Patient Education  2024 Elsevier Inc.  EXERCISE AND DIET:  We recommended that you start or continue a regular exercise program for good health. Regular exercise means any activity that makes your heart beat faster and makes you sweat.  We recommend exercising at least 30 minutes per day at least 3 days a week, preferably 4 or 5.  We also recommend a diet low in fat and sugar.  Inactivity, poor dietary choices and obesity can cause diabetes, heart attack, stroke, and kidney damage, among others.    ALCOHOL AND SMOKING:  Women should limit their alcohol intake to no more than 7 drinks/beers/glasses of wine (combined, not each!) per week. Moderation of alcohol intake to this level decreases your risk of breast cancer and liver damage. And of course, no recreational drugs are part of a healthy lifestyle.  And absolutely no smoking or even second hand smoke. Most people know smoking can cause heart and lung diseases, but did you know it also contributes to weakening of your bones? Aging of your skin?  Yellowing of your teeth and nails?  CALCIUM AND  VITAMIN D :  Adequate intake of calcium and Vitamin D  are recommended.  The recommendations for exact amounts of these supplements seem to change often, but generally speaking 600 mg of calcium (either carbonate or citrate) and 800 units of Vitamin D  per day seems prudent. Certain women may benefit from higher intake of Vitamin D .  If you are among these women, your doctor will have told you during your visit.    PAP SMEARS:  Pap smears, to check for cervical cancer or precancers,  have traditionally been done yearly, although recent scientific advances have shown that most women can have pap smears less often.  However, every woman still should have a physical exam from her gynecologist every year. It will include a breast check, inspection of the vulva and vagina to check for abnormal growths or skin changes, a visual exam of the cervix, and then an exam to evaluate the size and shape of the uterus  and ovaries.  And after 64 years of age, a rectal exam is indicated to check for rectal cancers. We will also provide age appropriate advice regarding health maintenance, like when you should have certain vaccines, screening for sexually transmitted diseases, bone density testing, colonoscopy, mammograms, etc.   MAMMOGRAMS:  All women over 26 years old should have a yearly mammogram. Many facilities now offer a 3D mammogram, which may cost around $50 extra out of pocket. If possible,  we recommend you accept the option to have the 3D mammogram performed.  It both reduces the number of women who will be called back for extra views which then turn out to be normal, and it is better than the routine mammogram at detecting truly abnormal areas.    COLONOSCOPY:  Colonoscopy to screen for colon cancer is recommended for all women at age 63.  We know, you hate the idea of the prep.  We agree, BUT, having colon cancer and not knowing it is worse!!  Colon cancer so often starts as a polyp that can be seen and removed at  colonscopy, which can quite literally save your life!  And if your first colonoscopy is normal and you have no family history of colon cancer, most women don't have to have it again for 10 years.  Once every ten years, you can do something that may end up saving your life, right?  We will be happy to help you get it scheduled when you are ready.  Be sure to check your insurance coverage so you understand how much it will cost.  It may be covered as a preventative service at no cost, but you should check your particular policy.

## 2024-11-17 NOTE — Progress Notes (Unsigned)
 63 y.o. [id number] Divorced Caucasian female here for annual exam.    Having a hip replacement.   Doing PT.    Has elevated cholesterol and sees Dr. Mona.   On Repatha .   Elevated creatinine.  Taking vit D.    Splinting for BMs at times. Using Colace.  Has a 4 mo grandson.  Eldest daughter is marrying in [place]. Youngest daughter working in [place].    PCP: Windy Coy, MD   No LMP recorded. Patient has had a hysterectomy.           Sexually active: Yes.    The current method of family planning is status post hysterectomy.    Menopausal hormone therapy:  n/a Exercising: No.  Not currently waiting for hip replacement. Left side  Smoker:  Former   OB History  Gravida Para Term Preterm AB Living  5 3 3  2 3   SAB IAB Ectopic Multiple Live Births  2        # Outcome Date GA Lbr Len/2nd Weight Sex Type Anes PTL Lv  5 SAB           4 SAB           3 Term           2 Term           1 Term              HEALTH MAINTENANCE: Last 2 paps:  2013 normal  History of abnormal Pap or positive HPV:   yes, 1995 hx of cryotherapy   Mammogram:   05/01/24 Breast Density Cat B, BIRADS Cat 1 neg  Colonoscopy:  2023 per pt, 5 polyps found, due in two years,10-24-15 Dr.Outlaw;next 5 years--per PCP not due Bone Density:  01/31/09  Result  normal    Immunization History  Administered Date(s) Administered   Influenza Inj Mdck Quad Pf 09/15/2019   Influenza-Unspecified 09/14/2016      reports that she has quit smoking. Her smoking use included cigarettes. She has never used smokeless tobacco. She reports current alcohol use of about 6.0 - 8.0 standard drinks of alcohol per week. She reports that she does not currently use drugs.  Past Medical History:  Diagnosis Date   Adenomatous polyps    Anemia    Anxiety    Asthma    Blood transfusion without reported diagnosis 01/1999   following hysterectomy   Bulging discs    Endometriosis    History of atrial fibrillation    History of  COVID-19    12/2018, 10/2020   Hx of respiratory failure    Hyperlipidemia    Hypertension    PONV (postoperative nausea and vomiting)    PVC (premature ventricular contraction)    Stroke [facility])    Urinary incontinence     Past Surgical History:  Procedure Laterality Date   5 back surgeries     ABDOMINAL HYSTERECTOMY  2001   TAH/RSO   AUGMENTATION MAMMAPLASTY Bilateral 2013   saline. pre pectoral   BLADDER SUSPENSION  2003   LSO and rectocele repair at [facility]   BREAST SURGERY     breast augmentation--saline implants   EPIDURAL BLOCK INJECTION     HERNIA REPAIR     I & D EXTREMITY Right 06/10/2021   Procedure: IRRIGATION AND DEBRIDEMENT ACHILLES;  Surgeon: Josefina Chew, MD;  Location: [facility] OR;  Service: Orthopedics;  Laterality: Right;   LUMBAR FUSION  2010   Dr. Gaither  REDUCTION MAMMAPLASTY Bilateral    REPLACEMENT TOTAL HIP W/  RESURFACING IMPLANTS  10/2021   SPINAL FUSION  2018   Dr. Colon   SPINAL FUSION  04/06/2020   SPINE SURGERY      Current Outpatient Medications  Medication Sig Dispense Refill   ALPRAZolam  (XANAX ) 0.5 MG tablet Take 0.5 mg by mouth daily as needed for anxiety.  1   amLODipine (NORVASC) 5 MG tablet Take 5 mg by mouth every morning.     aspirin  EC 81 MG tablet Take 1 tablet (81 mg total) by mouth daily. Swallow whole.     buPROPion  (WELLBUTRIN  XL) 300 MG 24 hr tablet Take 300 mg by mouth every morning.     Evolocumab  (REPATHA  SURECLICK) 140 MG/ML SOAJ Inject 140 mg into the skin every 14 (fourteen) days. 6 mL 3   ezetimibe  (ZETIA ) 10 MG tablet Take 1 tablet (10 mg total) by mouth daily. 30 tablet 4   losartan  (COZAAR ) 100 MG tablet Take 100 mg by mouth daily.     OVER THE COUNTER MEDICATION Take 1 Scoop by mouth 4 (four) times a week. VITAL PROTEINS COLLAGEN PEPTIDE POWDER     propranolol  ER (INDERAL  LA) 80 MG 24 hr capsule Take 80 mg by mouth every morning.     rosuvastatin  (CRESTOR ) 40 MG tablet Take 40 mg by mouth daily.     zolpidem  (AMBIEN ) 5 MG  tablet Take 5 mg by mouth at bedtime as needed for sleep.     No current facility-administered medications for this visit.    ALLERGIES: Ancef  [cefazolin ], Penicillins, Ceftin [cefuroxime], and Gabapentin   Family History  Problem Relation Age of Onset   Hypertension Mother    Stroke Mother    Hypertension Father    Cancer Father    Dementia Father    Hyperlipidemia Sister    Hypertension Sister    Stroke Maternal Aunt    Stroke Maternal Uncle    Colon cancer Maternal Uncle 72   Colon cancer Maternal Grandmother 26   Breast cancer Maternal Grandmother 54 - 59   Stroke Paternal Grandfather    Breast cancer Cousin 11   Hyperlipidemia Brother    Hypertension Brother    Hypertension Brother    Hypertension Brother     Review of Systems  All other systems reviewed and are negative.   PHYSICAL EXAM:  BP 116/78 (BP Location: Left Arm, Patient Position: Sitting)   Pulse 71   Ht 5' 4.75 (1.645 m)   Wt 124 lb (56.2 kg)   SpO2 100%   BMI 20.79 kg/m     General appearance: alert, cooperative and appears stated age Head: normocephalic, without obvious abnormality, atraumatic Neck: no adenopathy, supple, symmetrical, trachea midline and thyroid  normal to inspection and palpation Lungs: clear to auscultation bilaterally Breasts: bilateral implants, no masses or tenderness, No nipple retraction or dimpling, No nipple discharge or bleeding, No axillary adenopathy Heart: regular rate and rhythm Abdomen: soft, non-tender; no masses, no organomegaly Extremities: extremities normal, atraumatic, no cyanosis or edema Skin: skin color, texture, turgor normal. No rashes or lesions Lymph nodes: cervical, supraclavicular, and axillary nodes normal. Neurologic: grossly normal  Pelvic: External genitalia:  no lesions              No abnormal inguinal nodes palpated.              Urethra:  normal appearing urethra with no masses, tenderness or lesions  Bartholins and Skenes:  normal                 Vagina: normal appearing vagina with normal color and discharge, no lesions.  First degree rectocele.               Cervix:  absent              Pap taken: no Bimanual Exam:  Uterus:  absent.                Adnexa: no mass, fullness, tenderness              Rectal exam: yes.  Confirms.              Anus:  normal sphincter tone, no lesions  Chaperone was present for exam:  Clotilda DEL, CMA  ASSESSMENT: Well woman visit with gynecologic exam. Menopausal female.  Status post TAH/RSO. Remote hx abnormal paps. Status post LSO/sacrocolpopexy.  Status post midurethral sling.   First degree rectocele.  Using Colace.  Does some splinting.   Mixed incontinence. Bilateral breast implants. Hx stroke.  Hx respiratory failure.  Likely was Covid. Hx episode of atrial fibrillation.  Elevated cholesterol.  Elevated creatinine.  PHQ-2-9: 0  PLAN: Mammogram screening discussed. Self breast awareness reviewed. Pap and HRV collected:  no.  Not indicated.  Guidelines for Calcium , Vitamin D , regular exercise program including cardiovascular and weight bearing exercise. Medication refills:  NA Rectocele discussed.  She will use Miralax  regularly.   Declines urogynecology consultation at this time.  Dexa at [facility].  Patient will call to schedule.  Follow up:  yearly and prn.

## 2024-11-25 ENCOUNTER — Observation Stay (HOSPITAL_COMMUNITY)
Admission: EM | Admit: 2024-11-25 | Discharge: 2024-11-27 | DRG: 101 | Disposition: A | Attending: Internal Medicine | Admitting: Internal Medicine

## 2024-11-25 ENCOUNTER — Observation Stay (HOSPITAL_COMMUNITY)

## 2024-11-25 ENCOUNTER — Other Ambulatory Visit: Payer: Self-pay

## 2024-11-25 ENCOUNTER — Encounter (HOSPITAL_COMMUNITY): Payer: Self-pay | Admitting: *Deleted

## 2024-11-25 ENCOUNTER — Emergency Department (HOSPITAL_COMMUNITY)

## 2024-11-25 DIAGNOSIS — M48061 Spinal stenosis, lumbar region without neurogenic claudication: Secondary | ICD-10-CM | POA: Diagnosis present

## 2024-11-25 DIAGNOSIS — R569 Unspecified convulsions: Principal | ICD-10-CM

## 2024-11-25 DIAGNOSIS — F419 Anxiety disorder, unspecified: Secondary | ICD-10-CM | POA: Diagnosis not present

## 2024-11-25 DIAGNOSIS — E785 Hyperlipidemia, unspecified: Secondary | ICD-10-CM | POA: Diagnosis present

## 2024-11-25 DIAGNOSIS — F32A Depression, unspecified: Secondary | ICD-10-CM | POA: Diagnosis not present

## 2024-11-25 DIAGNOSIS — I10 Essential (primary) hypertension: Secondary | ICD-10-CM | POA: Diagnosis present

## 2024-11-25 DIAGNOSIS — K649 Unspecified hemorrhoids: Secondary | ICD-10-CM | POA: Insufficient documentation

## 2024-11-25 DIAGNOSIS — K760 Fatty (change of) liver, not elsewhere classified: Secondary | ICD-10-CM | POA: Insufficient documentation

## 2024-11-25 DIAGNOSIS — R413 Other amnesia: Secondary | ICD-10-CM | POA: Insufficient documentation

## 2024-11-25 DIAGNOSIS — K219 Gastro-esophageal reflux disease without esophagitis: Secondary | ICD-10-CM | POA: Diagnosis present

## 2024-11-25 DIAGNOSIS — N1831 Chronic kidney disease, stage 3a: Secondary | ICD-10-CM | POA: Diagnosis not present

## 2024-11-25 DIAGNOSIS — Z981 Arthrodesis status: Secondary | ICD-10-CM | POA: Insufficient documentation

## 2024-11-25 LAB — MAGNESIUM: Magnesium: 2 mg/dL (ref 1.7–2.4)

## 2024-11-25 LAB — CBC
HCT: 32 % — ABNORMAL LOW (ref 36.0–46.0)
Hemoglobin: 10.5 g/dL — ABNORMAL LOW (ref 12.0–15.0)
MCH: 34.4 pg — ABNORMAL HIGH (ref 26.0–34.0)
MCHC: 32.8 g/dL (ref 30.0–36.0)
MCV: 104.9 fL — ABNORMAL HIGH (ref 80.0–100.0)
Platelets: 198 K/uL (ref 150–400)
RBC: 3.05 MIL/uL — ABNORMAL LOW (ref 3.87–5.11)
RDW: 13.2 % (ref 11.5–15.5)
WBC: 6.3 K/uL (ref 4.0–10.5)
nRBC: 0 % (ref 0.0–0.2)

## 2024-11-25 LAB — CBG MONITORING, ED: Glucose-Capillary: 96 mg/dL (ref 70–99)

## 2024-11-25 LAB — COMPREHENSIVE METABOLIC PANEL WITH GFR
ALT: 158 U/L — ABNORMAL HIGH (ref 0–44)
AST: 85 U/L — ABNORMAL HIGH (ref 15–41)
Albumin: 3.7 g/dL (ref 3.5–5.0)
Alkaline Phosphatase: 82 U/L (ref 38–126)
Anion gap: 16 — ABNORMAL HIGH (ref 5–15)
BUN: 17 mg/dL (ref 8–23)
CO2: 25 mmol/L (ref 22–32)
Calcium: 9.2 mg/dL (ref 8.9–10.3)
Chloride: 96 mmol/L — ABNORMAL LOW (ref 98–111)
Creatinine, Ser: 1.61 mg/dL — ABNORMAL HIGH (ref 0.44–1.00)
GFR, Estimated: 36 mL/min — ABNORMAL LOW (ref >60)
Glucose, Bld: 105 mg/dL — ABNORMAL HIGH (ref 70–99)
Potassium: 3.7 mmol/L (ref 3.5–5.1)
Sodium: 137 mmol/L (ref 135–145)
Total Bilirubin: 1.5 mg/dL — ABNORMAL HIGH (ref 0.0–1.2)
Total Protein: 6.6 g/dL (ref 6.5–8.1)

## 2024-11-25 LAB — TROPONIN I (HIGH SENSITIVITY): Troponin I (High Sensitivity): <2 ng/L (ref <18)

## 2024-11-25 MED ORDER — PROPRANOLOL HCL ER 80 MG PO CP24
80.0000 mg | ORAL_CAPSULE | Freq: Every day | ORAL | Status: DC
  Filled 2024-11-25: qty 1

## 2024-11-25 MED ORDER — LORAZEPAM 2 MG/ML IJ SOLN
1.0000 mg | INTRAMUSCULAR | Status: DC | PRN
  Administered 2024-11-26: 1 mg via INTRAVENOUS
  Filled 2024-11-25: qty 1

## 2024-11-25 MED ORDER — EZETIMIBE 10 MG PO TABS
10.0000 mg | ORAL_TABLET | Freq: Every day | ORAL | Status: DC
  Administered 2024-11-26 – 2024-11-27 (×2): 10 mg via ORAL
  Filled 2024-11-25 (×2): qty 1

## 2024-11-25 MED ORDER — ACETAMINOPHEN 325 MG PO TABS
650.0000 mg | ORAL_TABLET | Freq: Four times a day (QID) | ORAL | Status: DC | PRN
  Administered 2024-11-26 – 2024-11-27 (×3): 650 mg via ORAL
  Filled 2024-11-25 (×3): qty 2

## 2024-11-25 MED ORDER — ONDANSETRON HCL 4 MG/2ML IJ SOLN
4.0000 mg | Freq: Once | INTRAMUSCULAR | Status: AC
  Administered 2024-11-25: 4 mg via INTRAVENOUS
  Filled 2024-11-25: qty 2

## 2024-11-25 MED ORDER — LACTATED RINGERS IV BOLUS
1000.0000 mL | Freq: Once | INTRAVENOUS | Status: AC
  Administered 2024-11-25: 1000 mL via INTRAVENOUS

## 2024-11-25 MED ORDER — OXYCODONE HCL 5 MG PO TABS
5.0000 mg | ORAL_TABLET | Freq: Once | ORAL | Status: AC
  Administered 2024-11-25: 5 mg via ORAL
  Filled 2024-11-25: qty 1

## 2024-11-25 MED ORDER — ALPRAZOLAM 0.5 MG PO TABS
0.5000 mg | ORAL_TABLET | Freq: Every day | ORAL | Status: DC | PRN
  Administered 2024-11-26: 0.5 mg via ORAL
  Filled 2024-11-25: qty 1

## 2024-11-25 MED ORDER — ENOXAPARIN SODIUM 30 MG/0.3ML IJ SOSY: 30.0000 mg | PREFILLED_SYRINGE | INTRAMUSCULAR | Status: DC

## 2024-11-25 MED ORDER — AMLODIPINE BESYLATE 5 MG PO TABS: 5.0000 mg | ORAL_TABLET | Freq: Every morning | ORAL | Status: DC

## 2024-11-25 MED ORDER — POLYETHYLENE GLYCOL 3350 17 G PO PACK: 17.0000 g | PACK | Freq: Every day | ORAL | Status: DC | PRN

## 2024-11-25 MED ORDER — MECLIZINE HCL 25 MG PO TABS
25.0000 mg | ORAL_TABLET | Freq: Once | ORAL | Status: AC
  Administered 2024-11-25: 25 mg via ORAL
  Filled 2024-11-25: qty 1

## 2024-11-25 MED ORDER — DIPHENHYDRAMINE HCL 50 MG/ML IJ SOLN
25.0000 mg | Freq: Once | INTRAMUSCULAR | Status: AC
  Administered 2024-11-26: 25 mg via INTRAVENOUS
  Filled 2024-11-25: qty 1

## 2024-11-25 MED ORDER — SODIUM CHLORIDE 0.9% FLUSH
3.0000 mL | Freq: Two times a day (BID) | INTRAVENOUS | Status: DC
  Administered 2024-11-25 – 2024-11-27 (×4): 3 mL via INTRAVENOUS

## 2024-11-25 MED ORDER — ACETAMINOPHEN 500 MG PO TABS
1000.0000 mg | ORAL_TABLET | Freq: Once | ORAL | Status: AC
  Administered 2024-11-25: 1000 mg via ORAL
  Filled 2024-11-25: qty 2

## 2024-11-25 MED ORDER — ACETAMINOPHEN 650 MG RE SUPP: 650.0000 mg | Freq: Four times a day (QID) | RECTAL | Status: DC | PRN

## 2024-11-25 MED ORDER — GADOBUTROL 1 MMOL/ML IV SOLN
5.5000 mL | Freq: Once | INTRAVENOUS | Status: AC | PRN
  Administered 2024-11-25: 5.5 mL via INTRAVENOUS

## 2024-11-25 MED ORDER — ASPIRIN 81 MG PO TBEC
81.0000 mg | DELAYED_RELEASE_TABLET | Freq: Every day | ORAL | Status: DC
  Administered 2024-11-26 – 2024-11-27 (×2): 81 mg via ORAL
  Filled 2024-11-25 (×2): qty 1

## 2024-11-25 MED ORDER — LOSARTAN POTASSIUM 50 MG PO TABS: 100.0000 mg | ORAL_TABLET | Freq: Every day | ORAL | Status: DC

## 2024-11-25 MED ORDER — ROSUVASTATIN CALCIUM 20 MG PO TABS
40.0000 mg | ORAL_TABLET | Freq: Every day | ORAL | Status: DC
  Administered 2024-11-26: 40 mg via ORAL
  Filled 2024-11-25: qty 2

## 2024-11-25 NOTE — Plan of Care (Signed)
 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 [name] dazed but back to her baseline now. Medication fill shows she is on wellbutrin  and got it filled at [facility] on 11/10/24. CT head negative. No significant electrolyte derangements noted on Chemistry.  Wellbutrin  can lower seizure threshold. Should be stopped.  She also cannot drive for 6 months. She has to be seizure free for 6 months, before she can resume driving.  Recommend outpatient follow up with neurology and a routine EEG outpatient.  Full seizure precautions listed at the bottom of this note and also placed in the discharge instructions.  Plan discussed over phone with [name] with the ED team.  [name] [facility]   Seizure precautions: Per [place] statutes, patients with seizures are not allowed to drive until they have been seizure-free for six months and cleared by a physician    Use caution when using heavy equipment or power tools. Avoid working on ladders or at heights. Take showers instead of baths. Ensure the water  temperature is not too high on the home water  heater. Do not go swimming alone. Do not lock yourself in a room alone (i.e. bathroom). When caring for infants or small children, sit down when holding, feeding, or changing them to minimize risk of injury to the child in the event you have a seizure. Maintain good sleep hygiene. Avoid alcohol.    If patient has another seizure, call 911 and bring them back to the ED if: A.  The seizure lasts longer than 5 minutes.      B.  The patient doesn't wake shortly after the seizure or has new problems such as difficulty seeing, speaking or moving following the seizure C.  The patient was injured during the seizure D.  The patient has a temperature over 102 F (39C) E.  The patient vomited during the seizure and now is having trouble breathing    During the Seizure   - First, ensure adequate ventilation and place  patients on the floor on their left side  Loosen clothing around the neck and ensure the airway is patent. If the patient is clenching the teeth, do not force the mouth open with any object as this can cause severe damage - Remove all items from the surrounding that can be hazardous. The patient may be oblivious to what's happening and may not even know what he or she is doing. If the patient is confused and wandering, either gently guide him/her away and block access to outside areas - Reassure the individual and be comforting - Call 911. In most cases, the seizure ends before EMS arrives. However, there are cases when seizures may last over 3 to 5 minutes. Or the individual may have developed breathing difficulties or severe injuries. If a pregnant patient or a person with diabetes develops a seizure, it is prudent to call an ambulance. - Finally, if the patient does not regain full consciousness, then call EMS. Most patients will remain confused for about 45 to 90 minutes after a seizure, so you must use judgment in calling for help. - Avoid restraints but make sure the patient is in a bed with padded side rails - Place the individual in a lateral position with the neck slightly flexed; this will help the saliva drain from the mouth and prevent the tongue from falling backward - Remove all nearby furniture and other hazards from the area - Provide verbal assurance as the individual is regaining consciousness -  Provide the patient with privacy if possible - Call for help and start treatment as ordered by the caregiver    After the Seizure (Postictal Stage)   After a seizure, most patients experience confusion, fatigue, muscle pain and/or a headache. Thus, one should permit the individual to sleep. For the next few days, reassurance is essential. Being calm and helping reorient the person is also of importance.   Most seizures are painless and end spontaneously. Seizures are not harmful to others  but can lead to complications such as stress on the lungs, brain and the heart. Individuals with prior lung problems may develop labored breathing and respiratory distress.

## 2024-11-25 NOTE — H&P (Signed)
 History and Physical   [name] FMW:[id number] DOB: [birth year] DOA: 11/25/2024  PCP: Windy Coy, MD   Patient coming from: Work  Chief Complaint: Seizure  HPI: [name] is a 63 y.o. female with medical history significant of hypertension, hyperlipidemia, CKD 3A, CVA, GERD, syncope, depression, anxiety, fatty liver, lumbar stenosis presenting after having a seizure at work.  Patient works in teacher, music and while at work, she was witnessed to tense up while standing and began having seizure activity, she then fell backwards and had continued activity for about a minute.  She did reportedly hit her head.   Some postictal state initially but has improved while in the ED.    Patient does not have history of seizure disorder.  However she has not been feeling herself for 8 weeks and worse for the last 2 weeks.  8 weeks ago she had a fall and hurt her ankle but she does not remember the events surrounding the fall and is unsure what happened.  For the past 2 weeks she has been having issues with her memory and forgetting what she is going to do frequently.  Has become very concerning for her including concerning for her and she is worrying that it could progress to the point that she could make a mistake at work.  She denies fevers, chills, chest pain, shortness of breath, abdominal pain, constipation, diarrhea.  Does have some nausea.  ED Course: Vital signs in the ED stable.  Lab workup included CMP with chloride 96, creatinine 1.61 which is near baseline of 1.35, glucose 105, AST stable at 85, ALT stable at 158, T. bili 1.5.  CBC with hemoglobin 10.5 which is below baseline of 12-13.  Troponin normal.  CT head and CT C-spine showed no acute abnormality.  CT C-spine did show some chronic degenerative changes.  Patient received Tylenol , oxycodone , 1 L IV fluids in the ED.  Neurology consulted and with patient having for seizure recommendation was to stop Wellbutrin  as this  can lower the seizure threshold and proceed with outpatient follow-up.  However, patient lives alone and does not have anyone who can stay with her tonight and is very uncomfortable with the idea of going home.  Review of Systems: As per HPI otherwise all other systems reviewed and are negative.  Past Medical History:  Diagnosis Date   Acute respiratory failure with hypoxia (HCC) 01/16/2019   Adenomatous polyps    Adjustment disorder with depressed mood 07/01/2017   Altered mental status, unspecified 04/10/2020   Anemia    Anxiety    Asthma    Blood transfusion without reported diagnosis 01/1999   following hysterectomy   Bulging discs    Closed fracture of fourth metatarsal bone of right foot 09/30/2024   COVID-19 virus infection 09/30/2024   Endometriosis    History of atrial fibrillation    History of COVID-19    12/2018, 10/2020   Hx of respiratory failure    Hyperlipidemia    Hypertension    PONV (postoperative nausea and vomiting)    PVC (premature ventricular contraction)    Stroke [facility])    Urinary incontinence     Past Surgical History:  Procedure Laterality Date   5 back surgeries     ABDOMINAL HYSTERECTOMY  2001   TAH/RSO   AUGMENTATION MAMMAPLASTY Bilateral 2013   saline. pre pectoral   BLADDER SUSPENSION  2003   LSO and rectocele repair at [facility]   BREAST SURGERY     breast  augmentation--saline implants   EPIDURAL BLOCK INJECTION     HERNIA REPAIR     I & D EXTREMITY Right 06/10/2021   Procedure: IRRIGATION AND DEBRIDEMENT ACHILLES;  Surgeon: Josefina Chew, MD;  Location: [facility] OR;  Service: Orthopedics;  Laterality: Right;   LUMBAR FUSION  2010   Dr. Gaither   REDUCTION MAMMAPLASTY Bilateral    REPLACEMENT TOTAL HIP W/  RESURFACING IMPLANTS  10/2021   SPINAL FUSION  2018   Dr. Colon   SPINAL FUSION  04/06/2020   SPINE SURGERY      Social History  reports that she has quit smoking. Her smoking use included cigarettes. She has never used smokeless tobacco.  She reports current alcohol use of about 6.0 - 8.0 standard drinks of alcohol per week. She reports that she does not currently use drugs.  Allergies  Allergen Reactions   Ancef  [Cefazolin ] Hives    Cause rash shortness of breath and vomiting    Penicillins Hives and Other (See Comments)    Has patient had a PCN reaction causing immediate rash, facial/tongue/throat swelling, SOB or lightheadedness with hypotension: Yes Has patient had a PCN reaction causing severe rash involving mucus membranes or skin necrosis:Yes Has patient had a PCN reaction that required hospitalization:No Has patient had a PCN reaction occurring within the last 10 years: No If all of the above answers are NO, then may proceed with Cephalosporin use.    Ceftin [Cefuroxime] Nausea And Vomiting   Gabapentin  Other (See Comments)    Jittery, anxious    Family History  Problem Relation Age of Onset   Hypertension Mother    Stroke Mother    Hypertension Father    Cancer Father    Dementia Father    Hyperlipidemia Sister    Hypertension Sister    Stroke Maternal Aunt    Stroke Maternal Uncle    Colon cancer Maternal Uncle 72   Colon cancer Maternal Grandmother 77   Breast cancer Maternal Grandmother 77 - 59   Stroke Paternal Grandfather    Breast cancer Cousin 40   Hyperlipidemia Brother    Hypertension Brother    Hypertension Brother    Hypertension Brother   Reviewed on admission  Prior to Admission medications   Medication Sig Start Date End Date Taking? Authorizing Provider  ALPRAZolam  (XANAX ) 0.5 MG tablet Take 0.5 mg by mouth daily as needed for anxiety. 06/11/18   [provider]  amLODipine  (NORVASC ) 5 MG tablet Take 5 mg by mouth every morning. 10/26/24   [provider]  aspirin  EC 81 MG tablet Take 1 tablet (81 mg total) by mouth daily. Swallow whole. 10/15/24   Hilty, Vinie BROCKS, MD  buPROPion  (WELLBUTRIN  XL) 300 MG 24 hr tablet Take 300 mg by mouth every morning.    [provider]  Evolocumab  (REPATHA  SURECLICK) 140 MG/ML SOAJ Inject 140 mg into the skin every 14 (fourteen) days. 10/15/24   Hilty, Vinie BROCKS, MD  ezetimibe  (ZETIA ) 10 MG tablet Take 1 tablet (10 mg total) by mouth daily. 08/28/18   Whitfield Raisin, NP  losartan  (COZAAR ) 100 MG tablet Take 100 mg by mouth daily. 07/28/24   [provider]  OVER THE COUNTER MEDICATION Take 1 Scoop by mouth 4 (four) times a week. VITAL PROTEINS COLLAGEN PEPTIDE POWDER    [provider]  propranolol  ER (INDERAL  LA) 80 MG 24 hr capsule Take 80 mg by mouth every morning. 09/12/24   [provider]  rosuvastatin  (CRESTOR ) 40 MG  tablet Take 40 mg by mouth daily. 05/30/23   [provider]  zolpidem  (AMBIEN ) 5 MG tablet Take 5 mg by mouth at bedtime as needed for sleep.    [provider]    Physical Exam: Vitals:   11/25/24 1525 11/25/24 1531 11/25/24 1630 11/25/24 1715  BP: (!) 126/96  128/86 117/89  Pulse: 86  85 80  Resp: 16  20 12   Temp: 98.7 F (37.1 C)     TempSrc: Oral     SpO2: 100%  100% 100%  Weight:  56.2 kg    Height:  5' 4 (1.626 m)      Physical Exam Constitutional:      General: She is not in acute distress.    Appearance: Normal appearance.  HENT:     Head: Normocephalic and atraumatic.     Mouth/Throat:     Mouth: Mucous membranes are moist.     Pharynx: Oropharynx is clear.  Eyes:     Extraocular Movements: Extraocular movements intact.     Pupils: Pupils are equal, round, and reactive to light.  Cardiovascular:     Rate and Rhythm: Normal rate and regular rhythm.     Pulses: Normal pulses.     Heart sounds: Normal heart sounds.  Pulmonary:     Effort: Pulmonary effort is normal. No respiratory distress.     Breath sounds: Normal breath sounds.  Abdominal:     General: Bowel sounds are normal. There is no distension.     Palpations: Abdomen is soft.     Tenderness: There is no abdominal tenderness.  Musculoskeletal:        General: No  swelling or deformity.  Skin:    General: Skin is warm and dry.  Neurological:     General: No focal deficit present.     Mental Status: Mental status is at baseline.    Labs on Admission: I have personally reviewed following labs and imaging studies  CBC: Recent Labs  Lab 11/25/24 1542  WBC 6.3  HGB 10.5*  HCT 32.0*  MCV 104.9*  PLT 198    Basic Metabolic Panel: Recent Labs  Lab 11/25/24 1542  NA 137  K 3.7  CL 96*  CO2 25  GLUCOSE 105*  BUN 17  CREATININE 1.61*  CALCIUM  9.2  MG 2.0    GFR: Estimated Creatinine Clearance: 30.5 mL/min (A) (by C-G formula based on SCr of 1.61 mg/dL (H)).  Liver Function Tests: Recent Labs  Lab 11/25/24 1542  AST 85*  ALT 158*  ALKPHOS 82  BILITOT 1.5*  PROT 6.6  ALBUMIN 3.7    Urine analysis:    Component Value Date/Time   COLORURINE YELLOW 10/15/2024 1050   APPEARANCEUR HAZY (A) 10/15/2024 1050   LABSPEC 1.022 10/15/2024 1050   PHURINE 5.5 10/15/2024 1050   GLUCOSEU NEGATIVE 10/15/2024 1050   HGBUR SMALL (A) 10/15/2024 1050   BILIRUBINUR NEGATIVE 10/15/2024 1050   BILIRUBINUR N 04/27/2013 0944   KETONESUR 15 (A) 10/15/2024 1050   PROTEINUR 100 (A) 10/15/2024 1050   UROBILINOGEN negative 04/27/2013 0944   UROBILINOGEN 0.2 09/23/2008 1004   NITRITE NEGATIVE 10/15/2024 1050   LEUKOCYTESUR NEGATIVE 10/15/2024 1050    Radiological Exams on Admission: CT Cervical Spine Wo Contrast Result Date: 11/25/2024 EXAM: CT CERVICAL SPINE WITHOUT CONTRAST 11/25/2024 04:25:47 PM TECHNIQUE: CT of the cervical spine was performed without the administration of intravenous contrast. Multiplanar reformatted images are provided for review. Automated exposure control, iterative reconstruction, and/or weight based adjustment  of the mA/kV was utilized to reduce the radiation dose to as low as reasonably achievable. COMPARISON: None available. CLINICAL HISTORY: Fall related to seizure. FINDINGS: CERVICAL SPINE: BONES AND ALIGNMENT: No  acute fracture or traumatic malalignment. Grade 1 degenerative anterolisthesis is present at C4-C5. DEGENERATIVE CHANGES: Left uncovertebral spur and moderate left foraminal narrowing at C5-C6. Moderate bilateral foraminal stenosis at C6-C7. SOFT TISSUES: No prevertebral soft tissue swelling. Atherosclerotic calcifications are present at the carotid bifurcations bilaterally without definite stenosis. IMPRESSION: 1. No acute abnormality of the cervical spine related to the fall from seizure. 2. Grade 1 degenerative anterolisthesis at C4-C5 with left uncovertebral spur, resulting in moderate left foraminal narrowing at C5-C6 and moderate bilateral foraminal stenosis at C6-C7. Electronically signed by: Lonni Necessary MD 11/25/2024 04:41 PM EST RP Workstation: [id number]   CT Head Wo Contrast Result Date: 11/25/2024 EXAM: CT HEAD WITHOUT CONTRAST 11/25/2024 04:25:47 PM TECHNIQUE: CT of the head was performed without the administration of intravenous contrast. Automated exposure control, iterative reconstruction, and/or weight based adjustment of the mA/kV was utilized to reduce the radiation dose to as low as reasonably achievable. COMPARISON: MR head without contrast 09/29/2024 and CT head without contrast 09/29/2024. CLINICAL HISTORY: Seizure, new-onset, no history of trauma. FINDINGS: BRAIN AND VENTRICLES: No acute hemorrhage. No evidence of acute infarct. No hydrocephalus. No extra-axial collection. No mass effect or midline shift. ORBITS: No acute abnormality. SINUSES: No acute abnormality. SOFT TISSUES AND SKULL: No acute soft tissue abnormality. No skull fracture. IMPRESSION: 1. No acute intracranial abnormality. Electronically signed by: Lonni Necessary MD 11/25/2024 04:38 PM EST RP Workstation: [id number]   EKG: Independently reviewed.  Sinus rhythm at 90 bpm.  Nonspecific T wave changes.  Assessment/Plan Principal Problem:   Seizure [facility]) Active Problems:   Essential hypertension    Depression   Hyperlipidemia   Anxiety   CKD stage 3a, GFR 45-59 ml/min (HCC)   Lumbar stenosis   Gastroesophageal reflux disease   Seizure(s) Memory deficits > New onset.  Occurred at work.  Did fall and hit her head.  CT head showed no acute abnormality. > Neurology consulted in the ED did not recommend starting antiepileptics as this was patient's first seizure.  Recommended stopping Wellbutrin  as this can lower seizure threshold. > Plan was initially for outpatient follow-up however patient has significant concerns about going home as she lives alone. > Patient later related additional history to me that is concerning for possible prior seizure activity with fall of unknown etiology 8 weeks ago as well as this new persistent confusion.  Unclear if this is separate issue or if she is having some seizures in her sleep with postictal confusion during the day. > Will observe and evaluate for etiology given concerned this may not be for seizure or that there may be a different underlying process with her ongoing memory issues. - Appreciate neurology initial recommendations.  Consider reconsult in the morning given this additional history.  Will hold off on consult until the morning when MRI results will be available. - Monitor on telemetry overnight - EEG - MRI brain with and without contrast  - Neurochecks - Stop Wellbutrin  - Supportive care  Hypertension - Continue losartan , propranolol , amlodipine  Hyperlipidemia - Continue rosuvastatin , Zetia  - On Repatha  outpatient  CKD 3A > Creatinine 1.61 which is near baseline of 1.3-1.4. - Received 1 L IV fluids in the ED - Trend renal function and electrolytes  History of CVA - Continue home ASA, rosuvastatin , Zetia   Depression Anxiety - Continue as  needed Xanax   DVT prophylaxis: Lovenox  Code Status:   Full Family Communication:  Friend updated at bedside Disposition Plan:   Patient is from:  Home  Anticipated DC  to:  Home  Anticipated DC date:  1 to 2 days  Anticipated DC barriers: None  Consults called:  Neurology, have signed off Admission status:  Observation, telemetry  Severity of Illness: The appropriate patient status for this patient is OBSERVATION. Observation status is judged to be reasonable and necessary in order to provide the required intensity of service to ensure the patient's safety. The patient's presenting symptoms, physical exam findings, and initial radiographic and laboratory data in the context of their medical condition is felt to place them at decreased risk for further clinical deterioration. Furthermore, it is anticipated that the patient will be medically stable for discharge from the hospital within 2 midnights of admission.    Marsa KATHEE Scurry MD [facility]  How to contact the [facility] Attending or Consulting provider 7A - 7P or covering provider during after hours 7P -7A, for this patient?   Check the care team in [facility] and look for a) attending/consulting [facility] provider listed and b) the [facility] team listed Log into [email or web address] and use [place]'s universal password to access. If you do not have the password, please contact the hospital operator. Locate the [facility] provider you are looking for under [facility] and page to a number that you can be directly reached. If you still have difficulty reaching the provider, please page the [facility] (Director on Call) for the Hospitalists listed on amion for assistance.  11/25/2024, 7:07 PM

## 2024-11-25 NOTE — Discharge Instructions (Signed)
Seizure precautions: °Per [place] DMV statutes, patients with seizures are not allowed to drive until they have been seizure-free for six months and cleared by a physician  °  °Use caution when using heavy equipment or power tools. Avoid working on ladders or at heights. Take showers instead of baths. Ensure the water temperature is not too high on the home water heater. Do not go swimming alone. Do not lock yourself in a room alone (i.e. bathroom). When caring for infants or small children, sit down when holding, feeding, or changing them to minimize risk of injury to the child in the event you have a seizure. Maintain good sleep hygiene. Avoid alcohol.  °  °If patient has another seizure, call 911 and bring them back to the ED if: °A.  The seizure lasts longer than 5 minutes.      °B.  The patient doesn't wake shortly after the seizure or has new problems such as difficulty seeing, speaking or moving following the seizure °C.  The patient was injured during the seizure °D.  The patient has a temperature over 102 F (39C) °E.  The patient vomited during the seizure and now is having trouble breathing °   °During the Seizure °  °- First, ensure adequate ventilation and place patients on the floor on their left side  °Loosen clothing around the neck and ensure the airway is patent. If the patient is clenching the teeth, do not force the mouth open with any object as this can cause severe damage °- Remove all items from the surrounding that can be hazardous. The patient may be oblivious to what's happening and may not even know what he or she is doing. °If the patient is confused and wandering, either gently guide him/her away and block access to outside areas °- Reassure the individual and be comforting °- Call 911. In most cases, the seizure ends before EMS arrives. However, there are cases when seizures may last over 3 to 5 minutes. Or the individual may have developed breathing difficulties or severe  injuries. If a pregnant patient or a person with diabetes develops a seizure, it is prudent to call an ambulance. °- Finally, if the patient does not regain full consciousness, then call EMS. Most patients will remain confused for about 45 to 90 minutes after a seizure, so you must use judgment in calling for help. °- Avoid restraints but make sure the patient is in a bed with padded side rails °- Place the individual in a lateral position with the neck slightly flexed; this will help the saliva drain from the mouth and prevent the tongue from falling backward °- Remove all nearby furniture and other hazards from the area °- Provide verbal assurance as the individual is regaining consciousness °- Provide the patient with privacy if possible °- Call for help and start treatment as ordered by the caregiver °  ° After the Seizure (Postictal Stage) °  °After a seizure, most patients experience confusion, fatigue, muscle pain and/or a headache. Thus, one should permit the individual to sleep. For the next few days, reassurance is essential. Being calm and helping reorient the person is also of importance. °  °Most seizures are painless and end spontaneously. Seizures are not harmful to others but can lead to complications such as stress on the lungs, brain and the heart. Individuals with prior lung problems may develop labored breathing and respiratory distress.  °  °

## 2024-11-25 NOTE — ED Notes (Addendum)
 PT is still vomiting at this time.  States that she feels like she has acid reflux and complains of epigastric pain. Would like something else for nausea is possible

## 2024-11-25 NOTE — ED Triage Notes (Signed)
 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

## 2024-11-25 NOTE — ED Notes (Signed)
 Pt states she is having a terrible headache and has had to episodes of vomiting since being back from MRI

## 2024-11-25 NOTE — ED Notes (Signed)
 Pt in MRI.

## 2024-11-25 NOTE — ED Provider Notes (Signed)
 [place] [facility]ERGENC[facility] DEPARTMENT AT [facility] Provider Note   CSN: [id number] Arrival date & time: 11/25/24  1513     Patient presents with: Seizures   [name] is a 63 y.o. female with history of anxiety and depression on Wellbutrin , hypertension, CKD, syncopal episodes thought to be due to dehydration, hyperlipidemia, presents with a reported seizure that occurred earlier today at work.  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  She reportedly hit the back of her head when she fell.  There was no reported tongue biting or bowel or bladder incontinence.  Patient denies any history of seizures.  She reports that she has not been keeping well-hydrated at home.  Denies any recent illnesses.  No new stressors or medication changes.    Seizures      Prior to Admission medications   Medication Sig Start Date End Date Taking? Authorizing Provider  ALPRAZolam  (XANAX ) 0.5 MG tablet Take 0.5 mg by mouth daily as needed for anxiety. 06/11/18   [provider]  amLODipine (NORVASC) 5 MG tablet Take 5 mg by mouth every morning. 10/26/24   [provider]  aspirin  EC 81 MG tablet Take 1 tablet (81 mg total) by mouth daily. Swallow whole. 10/15/24   Hilty, Vinie BROCKS, MD  buPROPion  (WELLBUTRIN  XL) 300 MG 24 hr tablet Take 300 mg by mouth every morning.    [provider]  Evolocumab  (REPATHA  SURECLICK) 140 MG/ML SOAJ Inject 140 mg into the skin every 14 (fourteen) days. 10/15/24   Hilty, Vinie BROCKS, MD  ezetimibe  (ZETIA ) 10 MG tablet Take 1 tablet (10 mg total) by mouth daily. 08/28/18   Whitfield Raisin, NP  losartan  (COZAAR ) 100 MG tablet Take 100 mg by mouth daily. 07/28/24   [provider]  OVER THE COUNTER MEDICATION Take 1 Scoop by mouth 4 (four) times a week. VITAL PROTEINS COLLAGEN PEPTIDE POWDER    [provider]  propranolol   ER (INDERAL  LA) 80 MG 24 hr capsule Take 80 mg by mouth every morning. 09/12/24   [provider]  rosuvastatin  (CRESTOR ) 40 MG tablet Take 40 mg by mouth daily. 05/30/23   [provider]  zolpidem  (AMBIEN ) 5 MG tablet Take 5 mg by mouth at bedtime as needed for sleep.    [provider]    Allergies: Ancef  [cefazolin ], Penicillins, Ceftin [cefuroxime], and Gabapentin     Review of Systems  Neurological:  Positive for seizures.    Updated Vital Signs BP 117/89   Pulse 80   Temp 98.7 F (37.1 C) (Oral)   Resp 12   Ht 5' 4 (1.626 m)   Wt 56.2 kg   SpO2 100%   BMI 21.27 kg/m   Physical Exam Vitals and nursing note reviewed.  Constitutional:      General: She is not in acute distress.    Appearance: She is well-developed.     Comments: Seemed somewhat dazed, but alert and able to answer questions appropriately  HENT:     Head: Normocephalic and atraumatic.  Eyes:     Conjunctiva/sclera: Conjunctivae normal.  Cardiovascular:     Rate and Rhythm: Normal rate and regular rhythm.     Heart sounds: No murmur heard. Pulmonary:     Effort: Pulmonary effort is normal. No respiratory distress.     Breath sounds: Normal breath sounds.  Abdominal:  Palpations: Abdomen is soft.     Tenderness: There is no abdominal tenderness.  Musculoskeletal:        General: No swelling.     Cervical back: Neck supple.     Comments: General No obvious deformity. No erythema, edema, contusions, open wounds   Palpation Non-tender to palpation of the clavicles,humerus, radius and ulna, carpal bones, 1st-5th metacarpals and phalanges bilaterally Non tender over the femur, patella, tibia or fibula bilaterally  Non-tender over the cervical, thoracic, or lumbar spinous processes. Non-tender to palpation of the paraspinal region of the back or chest wall diffusely  No tenderness of the pelvis diffusely  ROM Full ROM of shoulders bilaterally Full elbow, wrist, knee  flexion and extension bilaterally Intact plantarflexion and dorsiflexion, hip flexion bilaterally  Sensation: Sensation intact throughout the bilateral upper and lower extremity  Strength: 5/5 strength with resisted elbow and wrist flexion and extension bilaterally 5/5 strength with resisted knee flexion and extension and ankle plantarflexion and dorsiflexion bilaterally    Skin:    General: Skin is warm and dry.     Capillary Refill: Capillary refill takes less than 2 seconds.  Neurological:     General: No focal deficit present.     Mental Status: She is alert and oriented to person, place, and time.     Comments: Mental status: Alert and oriented to self, place, and month  Speech: Answers questions appropriately  Cranial Nerves: III, IV, VI: EOM intact, Pupils equal round and reactive, no gaze preference or deviation, no nystagmus. V: normal sensation in V1, V2, and V3 segments bilaterally VII: smiles, puffs cheeks, raises eyebrows, and closes eyes without asymmetry.  VIII: normal hearing to speech IX, X: normal palatal elevation, no uvular deviation XI: 5/5 head turn and 5/5 shoulder shrug bilaterally XII: midline tongue protrusion  Motor: 5/5 strength with resisted elbow flexion and extension bilaterally, wrist flexion and extension bilaterally, ankle plantarflexion and dorsiflexion bilaterally   Sensory: Intact sensation in upper and lower extremity bilaterally     Psychiatric:        Mood and Affect: Mood normal.     (all labs ordered are listed, but only abnormal results are displayed) Labs Reviewed  COMPREHENSIVE METABOLIC PANEL WITH GFR - Abnormal; Notable for the following components:      Result Value   Chloride 96 (*)    Glucose, Bld 105 (*)    Creatinine, Ser 1.61 (*)    AST 85 (*)    ALT 158 (*)    Total Bilirubin 1.5 (*)    GFR, Estimated 36 (*)    Anion gap 16 (*)    All other components within normal limits  CBC - Abnormal; Notable for the  following components:   RBC 3.05 (*)    Hemoglobin 10.5 (*)    HCT 32.0 (*)    MCV 104.9 (*)    MCH 34.4 (*)    All other components within normal limits  MAGNESIUM   COMPREHENSIVE METABOLIC PANEL WITH GFR  CBC  CBG MONITORING, ED  TROPONIN I (HIGH SENSITIVITY)    EKG: None  Radiology: CT Cervical Spine Wo Contrast Result Date: 11/25/2024 EXAM: CT CERVICAL SPINE WITHOUT CONTRAST 11/25/2024 04:25:47 PM TECHNIQUE: CT of the cervical spine was performed without the administration of intravenous contrast. Multiplanar reformatted images are provided for review. Automated exposure control, iterative reconstruction, and/or weight based adjustment of the mA/kV was utilized to reduce the radiation dose to as low as reasonably achievable. COMPARISON: None available. CLINICAL HISTORY:  Fall related to seizure. FINDINGS: CERVICAL SPINE: BONES AND ALIGNMENT: No acute fracture or traumatic malalignment. Grade 1 degenerative anterolisthesis is present at C4-C5. DEGENERATIVE CHANGES: Left uncovertebral spur and moderate left foraminal narrowing at C5-C6. Moderate bilateral foraminal stenosis at C6-C7. SOFT TISSUES: No prevertebral soft tissue swelling. Atherosclerotic calcifications are present at the carotid bifurcations bilaterally without definite stenosis. IMPRESSION: 1. No acute abnormality of the cervical spine related to the fall from seizure. 2. Grade 1 degenerative anterolisthesis at C4-C5 with left uncovertebral spur, resulting in moderate left foraminal narrowing at C5-C6 and moderate bilateral foraminal stenosis at C6-C7. Electronically signed by: Lonni Necessary MD 11/25/2024 04:41 PM EST RP Workstation: [id number]   CT Head Wo Contrast Result Date: 11/25/2024 EXAM: CT HEAD WITHOUT CONTRAST 11/25/2024 04:25:47 PM TECHNIQUE: CT of the head was performed without the administration of intravenous contrast. Automated exposure control, iterative reconstruction, and/or weight based adjustment of the  mA/kV was utilized to reduce the radiation dose to as low as reasonably achievable. COMPARISON: MR head without contrast 09/29/2024 and CT head without contrast 09/29/2024. CLINICAL HISTORY: Seizure, new-onset, no history of trauma. FINDINGS: BRAIN AND VENTRICLES: No acute hemorrhage. No evidence of acute infarct. No hydrocephalus. No extra-axial collection. No mass effect or midline shift. ORBITS: No acute abnormality. SINUSES: No acute abnormality. SOFT TISSUES AND SKULL: No acute soft tissue abnormality. No skull fracture. IMPRESSION: 1. No acute intracranial abnormality. Electronically signed by: Lonni Necessary MD 11/25/2024 04:38 PM EST RP Workstation: [id number]     Procedures   Medications Ordered in the ED  ALPRAZolam  (XANAX ) tablet 0.5 mg (has no administration in time range)  ezetimibe  (ZETIA ) tablet 10 mg (has no administration in time range)  rosuvastatin  (CRESTOR ) tablet 40 mg (has no administration in time range)  propranolol  ER (INDERAL  LA) 24 hr capsule 80 mg (has no administration in time range)  aspirin  EC tablet 81 mg (has no administration in time range)  amLODipine (NORVASC) tablet 5 mg (has no administration in time range)  losartan  (COZAAR ) tablet 100 mg (has no administration in time range)  enoxaparin  (LOVENOX ) injection 30 mg (has no administration in time range)  LORazepam  (ATIVAN ) injection 1 mg (has no administration in time range)  sodium chloride  flush (NS) 0.9 % injection 3 mL (has no administration in time range)  acetaminophen  (TYLENOL ) tablet 650 mg (has no administration in time range)    Or  acetaminophen  (TYLENOL ) suppository 650 mg (has no administration in time range)  polyethylene glycol (MIRALAX  / GLYCOLAX ) packet 17 g (has no administration in time range)  acetaminophen  (TYLENOL ) tablet 1,000 mg (1,000 mg Oral Given 11/25/24 1556)  lactated ringers  bolus 1,000 mL (1,000 mLs Intravenous New Bag/Given 11/25/24 1813)  oxyCODONE  (Oxy IR/ROXICODONE )  immediate release tablet 5 mg (5 mg Oral Given 11/25/24 1806)    Clinical Course as of 11/25/24 1924  Wed Nov 25, 2024  1656 Consulted with neurology Dr. Khaliqdina who recommended that if patient is taking Wellbutrin , she needs to discontinue this medication as it is known to lower seizure threshold.  No driving for 6 months.  Outpatient follow-up for EEG. [AF]  1858 Consulted with hospitalist Dr. Who recommended admission for EEG and observation [AF]  1923 Patient reporting some nausea, will try meclizine .  She has already had Zofran . [AF]    Clinical Course User Index [AF] Veta Palma, PA-C  Medical Decision Making Amount and/or Complexity of Data Reviewed Labs: ordered. Radiology: ordered.  Risk OTC drugs. Prescription drug management. Decision regarding hospitalization.     Differential diagnosis includes but is not limited to ACS, orthostatic syncope, vasovagal syncope, seizure, electrolyte abnormality, dehydration, intracranial hemorrhage, skull fracture  ED Course:  Upon initial evaluation, patient is well-appearing, no acute distress.  Seems postictal and somewhat dazed.  No neurological deficits on exam.  No obvious sign of head trauma, but reporting some head pain.  No tenderness to the upper or lower extremities bilaterally, no indication for x-ray imaging elsewhere.  Will give Tylenol  for pain.  Labs Ordered: I Ordered, and personally interpreted labs.  The pertinent results include:   CMP with elevated creatinine 1.61, up from baseline of 1.3 CBC without leukocytosis.  Hemoglobin 10.5, lower than baseline at around 12-13 AST and ALT elevated at 85 and 158 respectively.  T. bili mildly elevated at 1.5. Magnesium  within normal limits CBG upon arrival at 96 Troponin within normal limits  Imaging Studies ordered: I ordered imaging studies including CT head, CT cervical spine I independently visualized the imaging with scope of  interpretation limited to determining acute life threatening conditions related to emergency care. Imaging showed  No acute abnormalities I agree with the radiologist interpretation   Cardiac Monitoring: / EKG: The patient was maintained on a cardiac monitor.  I personally viewed and interpreted the cardiac monitored which showed an underlying rhythm of: Normal sinus rhythm   Consultations Obtained: I requested consultation with the neurologist Dr. Vanessa,  and discussed lab and imaging findings as well as pertinent plan - they recommend: Discontinuing Wellbutrin  and outpatient EEG I requested consultation with the Hospitalist Dr. Seena,  and discussed lab and imaging findings as well as pertinent plan - they recommend: Admission for EEG and observation.  Medications Given: LR bolus Oxycodone  Tylenol   Upon re-evaluation, patient remains well-appearing with stable vitals.  She reports that she still has some nausea, but declines any nausea medication.  She did receive Zofran  prior to arrival about 3 PM.  I consulted with neurology who suspects that patient's seizure is secondary to her Wellbutrin  use.  No significant electrolyte abnormalities on labs.  She appears to be mildly dehydrated with a bump in her creatinine.  CT scan of the head without any brain mass or other abnormality to explain her seizure today. I was able to verify seizure activity with a guest who arrived at bedside who reported full body shaking for approximately 1 minute during the episode. Feel this episode was less likely a syncopal event as she was described to have full body shaking.  Troponin within normal limits.  Patient did not have any feelings of dizziness, chest pain, or shortness of breath before the episode.  Her orthostatic vital signs are within normal limits, low concern for a orthostatic hypotension that may have caused her to fall. Patient CT head and cervical spine without any acute injuries.  Patient  was denying any pain in the upper or lower extremities, or anywhere else.  Low concern for any other fracture elsewhere at this time.   Patient lives alone and feels uncomfortable being discharged home at this time due to concern for an episode occurring at home with no one around.  Patient does seem mildly dehydrated with a bump in her creatinine.  Feel she would benefit from admission for continued fluids and inpatient EEG to ensure no further seizure-like activity before she goes home.  I spoke to  hospitalist Dr. Seena who agrees with admission.  Impression: New onset seizure  Disposition:  Admission with hospitalist Dr. Seena   This chart was dictated using voice recognition software, Dragon. Despite the best efforts of this provider to proofread and correct errors, errors may still occur which can change documentation meaning.       Final diagnoses:  Seizure-like activity [facility])    ED Discharge Orders     None          Veta Palma, [place] 11/25/24 1916    Tegeler, Lonni PARAS, MD 11/25/24 1950

## 2024-11-25 NOTE — ED Triage Notes (Signed)
 Pt placed on the monior  cmd notofied

## 2024-11-26 ENCOUNTER — Inpatient Hospital Stay (HOSPITAL_COMMUNITY)

## 2024-11-26 ENCOUNTER — Observation Stay (HOSPITAL_COMMUNITY)

## 2024-11-26 DIAGNOSIS — R569 Unspecified convulsions: Secondary | ICD-10-CM | POA: Diagnosis not present

## 2024-11-26 LAB — IRON AND TIBC
Iron: 33 ug/dL (ref 28–170)
Saturation Ratios: 12 % (ref 10.4–31.8)
TIBC: 276 ug/dL (ref 250–450)
UIBC: 243 ug/dL

## 2024-11-26 LAB — CBC
HCT: 28.9 % — ABNORMAL LOW (ref 36.0–46.0)
Hemoglobin: 9.7 g/dL — ABNORMAL LOW (ref 12.0–15.0)
MCH: 35.3 pg — ABNORMAL HIGH (ref 26.0–34.0)
MCHC: 33.6 g/dL (ref 30.0–36.0)
MCV: 105.1 fL — ABNORMAL HIGH (ref 80.0–100.0)
Platelets: 181 K/uL (ref 150–400)
RBC: 2.75 MIL/uL — ABNORMAL LOW (ref 3.87–5.11)
RDW: 13.4 % (ref 11.5–15.5)
WBC: 6 K/uL (ref 4.0–10.5)
nRBC: 0 % (ref 0.0–0.2)

## 2024-11-26 LAB — RETICULOCYTES
Immature Retic Fract: 22.1 % — ABNORMAL HIGH (ref 2.3–15.9)
RBC.: 2.75 MIL/uL — ABNORMAL LOW (ref 3.87–5.11)
Retic Count, Absolute: 55.3 K/uL (ref 19.0–186.0)
Retic Ct Pct: 2 % (ref 0.4–3.1)

## 2024-11-26 LAB — COMPREHENSIVE METABOLIC PANEL WITH GFR
ALT: 116 U/L — ABNORMAL HIGH (ref 0–44)
AST: 49 U/L — ABNORMAL HIGH (ref 15–41)
Albumin: 3.2 g/dL — ABNORMAL LOW (ref 3.5–5.0)
Alkaline Phosphatase: 64 U/L (ref 38–126)
Anion gap: 13 (ref 5–15)
BUN: 14 mg/dL (ref 8–23)
CO2: 28 mmol/L (ref 22–32)
Calcium: 9.3 mg/dL (ref 8.9–10.3)
Chloride: 99 mmol/L (ref 98–111)
Creatinine, Ser: 1.59 mg/dL — ABNORMAL HIGH (ref 0.44–1.00)
GFR, Estimated: 36 mL/min — ABNORMAL LOW (ref >60)
Glucose, Bld: 74 mg/dL (ref 70–99)
Potassium: 3.4 mmol/L — ABNORMAL LOW (ref 3.5–5.1)
Sodium: 140 mmol/L (ref 135–145)
Total Bilirubin: 0.9 mg/dL (ref 0.0–1.2)
Total Protein: 5.9 g/dL — ABNORMAL LOW (ref 6.5–8.1)

## 2024-11-26 LAB — FERRITIN: Ferritin: 171 ng/mL (ref 11–307)

## 2024-11-26 LAB — VITAMIN B12: Vitamin B-12: 492 pg/mL (ref 180–914)

## 2024-11-26 LAB — FOLATE: Folate: >20 ng/mL (ref >5.9)

## 2024-11-26 LAB — LACTIC ACID, PLASMA: Lactic Acid, Venous: 0.5 mmol/L (ref 0.5–1.9)

## 2024-11-26 MED ORDER — LEVETIRACETAM (KEPPRA) 500 MG/5 ML ADULT IV PUSH
2000.0000 mg | Freq: Once | INTRAVENOUS | Status: AC
  Administered 2024-11-26: 2000 mg via INTRAVENOUS
  Filled 2024-11-26: qty 20

## 2024-11-26 MED ORDER — POTASSIUM CHLORIDE CRYS ER 20 MEQ PO TBCR
40.0000 meq | EXTENDED_RELEASE_TABLET | Freq: Once | ORAL | Status: AC
  Administered 2024-11-26: 40 meq via ORAL
  Filled 2024-11-26: qty 2

## 2024-11-26 MED ORDER — SODIUM CHLORIDE 0.9 % IV SOLN: INTRAVENOUS | Status: DC

## 2024-11-26 MED ORDER — SODIUM CHLORIDE 0.9 % IV BOLUS
1000.0000 mL | Freq: Once | INTRAVENOUS | Status: AC
  Administered 2024-11-26: 1000 mL via INTRAVENOUS

## 2024-11-26 MED ORDER — LEVETIRACETAM 250 MG PO TABS
250.0000 mg | ORAL_TABLET | Freq: Two times a day (BID) | ORAL | Status: DC
  Administered 2024-11-26 – 2024-11-27 (×3): 250 mg via ORAL
  Filled 2024-11-26 (×3): qty 1

## 2024-11-26 MED ORDER — ZOLPIDEM TARTRATE 5 MG PO TABS
5.0000 mg | ORAL_TABLET | Freq: Every evening | ORAL | Status: DC | PRN
  Administered 2024-11-26: 5 mg via ORAL
  Filled 2024-11-26: qty 1

## 2024-11-26 NOTE — Progress Notes (Signed)
 NEUROLOGY CONSULT FOLLOW UP NOTE   Date of service: November 26, 2024 [name] MRN:  [id number] DOB:  December 24, 1961  Interval Hx/subjective   No further seizures.  Endorses drinking about a glass of wine at night.  Also endorses head injury couple months ago that she thought was secondary to a ? seizure.  Vitals   Vitals:   11/26/24 0501 11/26/24 0719 11/26/24 1147 11/26/24 1838  BP: (!) 86/57 (!) 83/54 (!) 96/56 93/63  Pulse: 89 85 79 79  Resp:  18 18 18   Temp: 98.2 F (36.8 C) 98.1 F (36.7 C) 98.6 F (37 C) 98 F (36.7 C)  TempSrc:      SpO2: 98% 100% 99% 99%  Weight:      Height:         Body mass index is 21.27 kg/m.  Physical Exam   General: Laying comfortably in bed; in no acute distress.  HENT: Normal oropharynx and mucosa. Normal external appearance of ears and nose.  Neck: Supple, no pain or tenderness  CV: No JVD. No peripheral edema.  Pulmonary: Symmetric Chest rise. Normal respiratory effort.  Abdomen: Soft to touch, non-tender.  Ext: No cyanosis, edema, or deformity  Skin: No rash. Normal palpation of skin.   Musculoskeletal: Normal digits and nails by inspection. No clubbing.   Neurologic Examination  Mental status/Cognition: Alert, oriented to self, place, month and year, good attention.  Speech/language: Fluent, comprehension intact, object naming intact, repetition intact.  Cranial nerves:   CN II Pupils equal and reactive to light, no VF deficits    CN III,IV,VI EOM intact, no gaze preference or deviation, no nystagmus    CN V normal sensation in V1, V2, and V3 segments bilaterally    CN VII no asymmetry, no nasolabial fold flattening    CN VIII normal hearing to speech    CN IX & X normal palatal elevation, no uvular deviation    CN XI 5/5 head turn and 5/5 shoulder shrug bilaterally    CN XII midline tongue protrusion    Motor:  Muscle bulk: normal, tone normal, pronator drift none tremor none Mvmt Root Nerve  Muscle  Right Left Comments  SA C5/6 Ax Deltoid 5 5   EF C5/6 Mc Biceps 5 5   EE C6/7/8 Rad Triceps 5 5   WF C6/7 Med FCR     WE C7/8 PIN ECU     F Ab C8/T1 U ADM/FDI 5 5   HF L1/2/3 Fem Illopsoas 5 5   KE L2/3/4 Fem Quad 5 5   DF L4/5 D Peron Tib Ant 5 5   PF S1/2 Tibial Grc/Sol 5 5    Sensation:  Light touch Intact throughout   Pin prick    Temperature    Vibration   Proprioception    Coordination/Complex Motor:  - Finger to Nose intact bilaterally - Heel to shin intact bilaterally - Rapid alternating movement are normal - Gait: Deferred ordered. Medications  Current Facility-Administered Medications:    0.9 %  sodium chloride  infusion, , Intravenous, Continuous, Dahal, Binaya, MD, Last Rate: 100 mL/hr at 11/26/24 0807, New Bag at 11/26/24 0807   acetaminophen  (TYLENOL ) tablet 650 mg, 650 mg, Oral, Q6H PRN, 650 mg at 11/26/24 1735 **OR** acetaminophen  (TYLENOL ) suppository 650 mg, 650 mg, Rectal, Q6H PRN, Melvin, Alexander B, MD   ALPRAZolam  (XANAX ) tablet 0.5 mg, 0.5 mg, Oral, Daily PRN, Melvin, Alexander B, MD, 0.5 mg at 11/26/24 0001   aspirin  EC  tablet 81 mg, 81 mg, Oral, Daily, Melvin, Alexander B, MD, 81 mg at 11/26/24 9191   enoxaparin  (LOVENOX ) injection 30 mg, 30 mg, Subcutaneous, Q24H, Melvin, Alexander B, MD   ezetimibe  (ZETIA ) tablet 10 mg, 10 mg, Oral, Daily, Melvin, Alexander B, MD, 10 mg at 11/26/24 0809   levETIRAcetam (KEPPRA) tablet 250 mg, 250 mg, Oral, BID, Lindzen, Eric, MD, 250 mg at 11/26/24 1359   LORazepam  (ATIVAN ) injection 1 mg, 1 mg, Intravenous, Q5 Min x 2 PRN, Melvin, Alexander B, MD, 1 mg at 11/26/24 0045   polyethylene glycol (MIRALAX  / GLYCOLAX ) packet 17 g, 17 g, Oral, Daily PRN, Melvin, Alexander B, MD   sodium chloride  flush (NS) 0.9 % injection 3 mL, 3 mL, Intravenous, Q12H, Melvin, Alexander B, MD, 3 mL at 11/26/24 9177   zolpidem  (AMBIEN ) tablet 5 mg, 5 mg, Oral, QHS PRN, Arlice Reichert, MD  Labs and Diagnostic Imaging   CBC:  Recent Labs   Lab 11/25/24 1542 11/26/24 0443  WBC 6.3 6.0  HGB 10.5* 9.7*  HCT 32.0* 28.9*  MCV 104.9* 105.1*  PLT 198 181    Basic Metabolic Panel:  Lab Results  Component Value Date   NA 140 11/26/2024   K 3.4 (L) 11/26/2024   CO2 28 11/26/2024   GLUCOSE 74 11/26/2024   BUN 14 11/26/2024   CREATININE 1.59 (H) 11/26/2024   CALCIUM  9.3 11/26/2024   GFRNONAA 36 (L) 11/26/2024   GFRAA 52 (L) 04/09/2020   Lipid Panel:  Lab Results  Component Value Date   LDLCALC 78 09/30/2024   HgbA1c:  Lab Results  Component Value Date   HGBA1C 5.5 01/17/2019   Urine Drug Screen:     Component Value Date/Time   LABOPIA NONE DETECTED 04/09/2020 1343   COCAINSCRNUR NONE DETECTED 04/09/2020 1343   LABBENZ POSITIVE (A) 04/09/2020 1343   AMPHETMU NONE DETECTED 04/09/2020 1343   THCU NONE DETECTED 04/09/2020 1343   LABBARB NONE DETECTED 04/09/2020 1343    Alcohol Level     Component Value Date/Time   ETH <10 10/21/2023 1808   INR  Lab Results  Component Value Date   INR 1.0 10/21/2023   APTT  Lab Results  Component Value Date   APTT 28 10/21/2023   AED levels: No results found for: PHENYTOIN, ZONISAMIDE, LAMOTRIGINE, LEVETIRACETA  CT Head without contrast(Personally reviewed): CTH was negative for a large hypodensity concerning for a large territory infarct or hyperdensity concerning for an ICH  MRI Brain(Personally reviewed): 1. No acute intracranial abnormality. 2. Right parietal scalp effusion without underlying fracture.  rEEG:  This study is within normal limits. The excessive beta activity seen in the background is most likely due to the effect of benzodiazepine and is a benign EEG pattern. No seizures or epileptiform discharges were seen throughout the recording.    Assessment   [name] is a 63 y.o. female [name] is a 63 y.o. female presenting after a first-time witnessed seizure, followed by a second witnessed seizure after admission.  - Exam  reveals left sided tongue bite. No focal neurological deficits.  - CT head: No acute intracranial abnormality.  - CT C-spine: No acute abnormality of the cervical spine related to the fall from seizure. Grade 1 degenerative anterolisthesis at C4-C5 with left uncovertebral spur, resulting in moderate left foraminal narrowing at C5-C6 and moderate bilateral foraminal stenosis at C6-C7. - MRI brain: No acute intracranial abnormality. Right parietal scalp effusion without underlying fracture. - Routine EEG with no epileptiform  discharges, no seizures. - Impression: First-time witnessed seizure in the context of a 3-4 month history of recurrent black-out spells that have occurred while she was alone. Likely suspect epileptic seizures.  Recommendations  - continue Keppra 250mg  BID - No driving for 6 months.  Has to be seizure-free before she can resume driving. -Full seizure precautions listed to the bottom of the note.  These were also discussed with patient at the bedside. - Neurology inpatient team will sign off.  Please feel free to contact us  with any questions or concerns. -Patient will need to follow-up with neurology outpatient. ______________________________________________________________________   Signed, Ellouise Mari, MD [facility]   Seizure precautions: Per [place]  DMV statutes, patients with seizures are not allowed to drive until they have been seizure-free for six months and cleared by a physician    Use caution when using heavy equipment or power tools. Avoid working on ladders or at heights. Take showers instead of baths. Ensure the water  temperature is not too high on the home water  heater. Do not go swimming alone. Do not lock yourself in a room alone (i.e. bathroom). When caring for infants or small children, sit down when holding, feeding, or changing them to minimize risk of injury to the child in the event you have a seizure. Maintain good sleep  hygiene. Avoid alcohol.    If patient has another seizure, call 911 and bring them back to the ED if: A.  The seizure lasts longer than 5 minutes.      B.  The patient doesn't wake shortly after the seizure or has new problems such as difficulty seeing, speaking or moving following the seizure C.  The patient was injured during the seizure D.  The patient has a temperature over 102 F (39C) E.  The patient vomited during the seizure and now is having trouble breathing    During the Seizure   - First, ensure adequate ventilation and place patients on the floor on their left side  Loosen clothing around the neck and ensure the airway is patent. If the patient is clenching the teeth, do not force the mouth open with any object as this can cause severe damage - Remove all items from the surrounding that can be hazardous. The patient may be oblivious to what's happening and may not even know what he or she is doing. If the patient is confused and wandering, either gently guide him/her away and block access to outside areas - Reassure the individual and be comforting - Call 911. In most cases, the seizure ends before EMS arrives. However, there are cases when seizures may last over 3 to 5 minutes. Or the individual may have developed breathing difficulties or severe injuries. If a pregnant patient or a person with diabetes develops a seizure, it is prudent to call an ambulance. - Finally, if the patient does not regain full consciousness, then call EMS. Most patients will remain confused for about 45 to 90 minutes after a seizure, so you must use judgment in calling for help. - Avoid restraints but make sure the patient is in a bed with padded side rails - Place the individual in a lateral position with the neck slightly flexed; this will help the saliva drain from the mouth and prevent the tongue from falling backward - Remove all nearby furniture and other hazards from the area - Provide verbal  assurance as the individual is regaining consciousness - Provide the patient with privacy if possible - Call  for help and start treatment as ordered by the caregiver    After the Seizure (Postictal Stage)   After a seizure, most patients experience confusion, fatigue, muscle pain and/or a headache. Thus, one should permit the individual to sleep. For the next few days, reassurance is essential. Being calm and helping reorient the person is also of importance.   Most seizures are painless and end spontaneously. Seizures are not harmful to others but can lead to complications such as stress on the lungs, brain and the heart. Individuals with prior lung problems may develop labored breathing and respiratory distress.

## 2024-11-26 NOTE — Plan of Care (Signed)
  Problem: Education: Goal: Expressions of having a comfortable level of knowledge regarding the disease process will increase Outcome: Progressing   Problem: Coping: Goal: Ability to adjust to condition or change in health will improve Outcome: Progressing Goal: Ability to identify appropriate support needs will improve Outcome: Progressing   Problem: Health Behavior/Discharge Planning: Goal: Compliance with prescribed medication regimen will improve Outcome: Progressing   Problem: Medication: Goal: Risk for medication side effects will decrease Outcome: Progressing   Problem: Clinical Measurements: Goal: Complications related to the disease process, condition or treatment will be avoided or minimized Outcome: Progressing Goal: Diagnostic test results will improve Outcome: Progressing   Problem: Safety: Goal: Verbalization of understanding the information provided will improve Outcome: Progressing   Problem: Self-Concept: Goal: Level of anxiety will decrease Outcome: Progressing Goal: Ability to verbalize feelings about condition will improve Outcome: Progressing   Problem: Education: Goal: Knowledge of General Education information will improve Description: Including pain rating scale, medication(s)/side effects and non-pharmacologic comfort measures Outcome: Progressing   Problem: Health Behavior/Discharge Planning: Goal: Ability to manage health-related needs will improve Outcome: Progressing   Problem: Clinical Measurements: Goal: Ability to maintain clinical measurements within normal limits will improve Outcome: Progressing Goal: Will remain free from infection Outcome: Progressing Goal: Diagnostic test results will improve Outcome: Progressing Goal: Respiratory complications will improve Outcome: Progressing Goal: Cardiovascular complication will be avoided Outcome: Progressing   Problem: Activity: Goal: Risk for activity intolerance will  decrease Outcome: Progressing   Problem: Nutrition: Goal: Adequate nutrition will be maintained Outcome: Progressing   Problem: Coping: Goal: Level of anxiety will decrease Outcome: Progressing   Problem: Elimination: Goal: Will not experience complications related to bowel motility Outcome: Progressing Goal: Will not experience complications related to urinary retention Outcome: Progressing   Problem: Pain Managment: Goal: General experience of comfort will improve and/or be controlled Outcome: Progressing   Problem: Safety: Goal: Ability to remain free from injury will improve Outcome: Progressing   Problem: Skin Integrity: Goal: Risk for impaired skin integrity will decrease Outcome: Progressing

## 2024-11-26 NOTE — Progress Notes (Signed)
 PROGRESS NOTE  [name]  DOB: [birth year]  PCP: Windy Coy, MD FMW:[id number]  DOA: 11/25/2024  LOS: 0 days  Hospital Day: 2  Subjective: Patient was seen and examined this morning. Pleasant middle-aged Caucasian female.  Lying on bed.  Not in distress. History reviewed in detail as below. Seen by neurology earlier.  Pending EEG  Chart reviewed Afebrile, heart rate 80s, blood pressure in 80s, breathing room air Labs this morning with WC count 6, hemoglobin 9.7, potassium 3.4, AST, ALT elevated but normal alk phos and total bili, lactic acid level normal  Brief narrative: [name] is a 63 y.o. female with PMH significant for HTN, HLD, h/o A-fib, COPD, CKD 3, syncope, GERD, fatty liver, lumbar stenosis anxiety, depression. Most recently hospitalized 10/7 to 10/8 for syncopal episode, suspected to be secondary to dehydration and COVID. 12/3, patient was brought to the ED after having a witnessed generalized tonic-clonic seizure at work.  She was standing at the time, fell backwards with seizure and continued to have activity for about a minute.  Reportedly hit her head.  Was in postictal state on arrival but mental status improved in the ED. Has no prior history of seizure. But reports that she has been feeling off for the last 8 weeks and the worse last 2 weeks.  She had a fall about 8 weeks ago and hurt her ankle, does not remember the circumstances around the fall. For the past 2 weeks she has been having issues with her memory and forgetting what she is going to do frequently.  She reports she has become very concerning for her including concerning for her and she is worrying that it could progress to the point that she could make a mistake at work.   In the ED, patient was afebrile, heart rate in 80s, blood pressure in 120s Initial labs with WBC count 6.3, hemoglobin 10.5, BUN/creatinine 17/1.61, AST, ALT elevated, troponin normal CT head and CT C-spine did not  show any acute abnormality.   MRI brain without acute intracranial abnormality  In the ED, patient was also witnessed to have a GTCS lasting 2 minutes, bit her tongue with seizure.  Looked confused for about 2 minutes.  Was given IV Ativan  Neurology was consulted Loaded with IV Keppra Admitted to [facility]  Assessment and plan: New GTCS No prior history of seizure disorder. Presented with GTCS at work and another 1 witnessed in the ED Also reported 8 weeks of progressive memory deficits CT head, CT C-spine and MRI brain without abnormality Neurology consult appreciated EEG requested Loaded with IV Keppra.  Currently Continued on 250 mg twice daily Wellbutrin  kept on hold because of seizure threshold lowering property   AKI on CKD 3A Creatinine at baseline less than 1.36 from October.   Presented with creatinine elevated to 1.61.  Continue to monitor  Recent Labs    05/22/24 1030 09/26/24 1709 09/29/24 1050 09/30/24 0410 10/15/24 0956 11/25/24 1542 11/26/24 0443  BUN 38* 43* 29* 31* 33* 17 14  CREATININE 1.05* 1.42* 1.36* 1.26* 1.36* 1.61* 1.59*  CO2 25 31 27 28 26 25 28    Elevated transaminases H/o hepatic steatosis Chronic alcohol use Elevated AST, ALT but normal alk phos and bili Likely due to hepatic steatosis. Reports she does not drink daily, drinks a total of 6-8 standard drinks of alcohol per week.  She does not think she is at risk of withdrawal. Patient believes that her liver enzymes are elevated because of Crestor .  In the past, LFTs had improved after Crestor  was discontinued.  She wants to hold Crestor  again and see her LFTs improved. Counseled to quit Recent Labs  Lab 11/25/24 1542 11/26/24 0443  AST 85* 49*  ALT 158* 116*  ALKPHOS 82 64  BILITOT 1.5* 0.9  PROT 6.6 5.9*  ALBUMIN 3.7 3.2*  PLT 198 181   Hypokalemia Potassium level low at 4 this morning.  Replacement given Recent Labs  Lab 11/25/24 1542 11/26/24 0443  K 3.7 3.4*  MG 2.0  --     Acute anemia Hemoglobin at baseline till October 2025. Presented with hemoglobin low at 10.5.  Macrocytosis noted. Vitamin B12 and folic acid  level normal. Recent Labs    09/29/24 1050 09/30/24 0410 10/15/24 0956 11/25/24 1542 11/26/24 0443  HGB 12.6 12.4 13.5 10.5* 9.7*  MCV 101.9* 99.7 101.5* 104.9* 105.1*  VITAMINB12  --   --   --   --  492  FOLATE  --   --   --   --  >20.0  FERRITIN  --   --   --   --  171  TIBC  --   --   --   --  276  IRON  --   --   --   --  33  RETICCTPCT  --   --   --   --  2.0   Hypertension PTA meds- propranolol  80 mg daily, amlodipine 5 mg daily, losartan  100 mg daily  Because of low blood pressure this morning, antihypertensives were held.   Continue to monitor blood pressure Most recent echo from October 2025 at mention an EF of 45 to 50%.  She followed up with cardiologist Dr. Pietro on 11/25.  According to his note, on his review, EF was more normal like 55 to 60%.  H/o A-fib Patient reports she had a 1 episode of seizure almost 20 years ago.  Never had recurrence.  She had a Zio patch placed as well.  So far no recurrence of A-fib. Continue to monitor telemetry.   H/o CVA HLD Continue home ASA, rosuvastatin , Zetia  Also on Repatha  outpatient   Depression, Anxiety Continue as needed Xanax , Ambien  as needed at bedtime  Nutrition Status:         Mobility: Encourage ambulation.  Independent at baseline  Goals of care   Code Status: Full Code     DVT prophylaxis:  enoxaparin  (LOVENOX ) injection 30 mg Start: 11/25/24 2000   Antimicrobials: None Fluid: NS at 100 mL/h Consultants: Neurology Family Communication: None at bedside  Status: Inpatient Level of care:  Telemetry   Patient is from: Home Needs to continue in-hospital care: Ongoing workup for seizure Anticipated d/c to: Pending clinical course.  Ultimately home in 1 to 2 days      Diet:  Diet Order             Diet regular Room service appropriate? Yes;  Fluid consistency: Thin  Diet effective now                   Scheduled Meds:  aspirin  EC  81 mg Oral Daily   enoxaparin  (LOVENOX ) injection  30 mg Subcutaneous Q24H   ezetimibe   10 mg Oral Daily   levETIRAcetam  250 mg Oral BID   sodium chloride  flush  3 mL Intravenous Q12H    PRN meds: acetaminophen  **OR** acetaminophen , ALPRAZolam , LORazepam , polyethylene glycol, zolpidem    Infusions:   sodium chloride  100 mL/hr at 11/26/24 0807  Antimicrobials: Anti-infectives (From admission, onward)    None       Objective: Vitals:   11/26/24 0719 11/26/24 1147  BP: (!) 83/54 (!) 96/56  Pulse: 85 79  Resp: 18 18  Temp: 98.1 F (36.7 C) 98.6 F (37 C)  SpO2: 100% 99%    Intake/Output Summary (Last 24 hours) at 11/26/2024 1322 Last data filed at 11/26/2024 0227 Gross per 24 hour  Intake 20 ml  Output --  Net 20 ml   Filed Weights   11/25/24 1531  Weight: 56.2 kg   Weight change:  Body mass index is 21.27 kg/m.   Physical Exam: General exam: Pleasant, middle-aged Caucasian female.  Not in pain Skin: No rashes, lesions or ulcers. HEENT: Atraumatic, normocephalic, no obvious bleeding Lungs: Clear to auscultation bilaterally,  CVS: S1, S2, no murmur,   GI/Abd: Soft, nontender, nondistended, bowel sound present,   CNS: Alert, awake, oriented x 3 Psychiatry: Sad affect Extremities: No pedal edema, no calf tenderness,   Data Review: I have personally reviewed the laboratory data and studies available.  F/u labs ordered Unresulted Labs (From admission, onward)     Start     Ordered   12/02/24 0500  Creatinine, serum  (enoxaparin  (LOVENOX )    CrCl >/= 30 ml/min)  Weekly,   R     Comments: while on enoxaparin  therapy    11/25/24 1907   11/26/24 1006  Hemoglobin A1c  Add-on,   AD       Question:  Specimen collection method  Answer:  Lab=Lab collect   11/26/24 1005            Signed, Chapman Rota, MD [facility] 11/26/2024

## 2024-11-26 NOTE — Progress Notes (Signed)
 I had visited with the patient and had a long discussion with them. Not long after I had left the room at approximately 0030, I noticed the patient flailing when walking past the room. I rushed in and turned the patient on their side where I noted trace blood drainage from the mouth. Patient had stopped shaking when I got into the room but remained unresponsive for approximately 1 to 1.5 minutes.  Suction equipment was gathered by other staff as well as bedside rails. After stabilizing the patient, I went to grab Ativan  to administer although the seizure did appear to be complete. Patient has since been easily startled and fatigued.  A virtual monitor was discussed with Dr. Franky and will be initiated for seizure monitoring ASAP.

## 2024-11-26 NOTE — Procedures (Signed)
 [name]  MRN: [id number]  Epilepsy Attending: Arlin MALVA Krebs  Referring Physician/Provider: Franky Redia SAILOR, MD  Date: 11/26/2024 Duration:   Patient history: 63 y.o. female presenting after a first-time witnessed seizure, followed by a second witnessed seizure after admission. EEG to evaluate for seizure  Level of alertness: Awake  AEDs during EEG study: Xanax   Technical aspects: This EEG study was done with scalp electrodes positioned according to the 10-20 International system of electrode placement. Electrical activity was reviewed with band pass filter of 1-70Hz , sensitivity of 7 uV/mm, display speed of 55mm/sec with a 60Hz  notched filter applied as appropriate. EEG data were recorded continuously and digitally stored.  Video monitoring was available and reviewed as appropriate.  Description: The posterior dominant rhythm consists of 10 Hz activity of moderate voltage (25-35 uV) seen predominantly in posterior head regions, symmetric and reactive to eye opening and eye closing. There is an excessive amount of 15 to 18 Hz beta activity distributed symmetrically and diffusely. Hyperventilation did not show any EEG change.  Physiologic photic driving was seen during photic stimulation.    ABNORMALITY - Excessive beta, generalized  IMPRESSION: This study is within normal limits. The excessive beta activity seen in the background is most likely due to the effect of benzodiazepine and is a benign EEG pattern. No seizures or epileptiform discharges were seen throughout the recording.  A normal interictal EEG does not exclude the diagnosis of epilepsy.   Justyn Boyson O Sharae Zappulla

## 2024-11-26 NOTE — Significant Event (Signed)
 Was witnessed to have a seizure-like activity lasting for around 2 minutes and as per the nurse patient had tonic-clonic activity with loss of consciousness and also patient bit her tongue.  Patient appeared confused for about a minute or 2 after the episode.  Was given 1 mg IV Ativan .  On exam at bedside patient appears nonfocal oriented to time place and person.  In all extremities.  I reviewed patient's medication labs and notes.  I discussed with on-call neurologist Dr. Lindzen who will be seeing patient in consult.  Neurologist planning to load with Keppra.  EEG ordered.  Redia Cleaver MD

## 2024-11-26 NOTE — Progress Notes (Signed)
 EEG complete - results pending

## 2024-11-26 NOTE — Consult Note (Addendum)
 NEUROLOGY CONSULT NOTE   Date of service: November 26, 2024 [name] MRN:  [id number] DOB:  July 27, 1961 Chief Complaint: New onset seizure Requesting Provider: Seena Marsa NOVAK, MD  History of Present Illness  [name] is a 63 y.o. female who has a past medical history of Acute respiratory failure with hypoxia (HCC) (01/16/2019), Adenomatous polyps, Adjustment disorder with depressed mood (07/01/2017), Altered mental status, unspecified (04/10/2020), Anemia, Anxiety, Asthma, Blood transfusion without reported diagnosis (01/1999), Bulging discs, Closed fracture of fourth metatarsal bone of right foot (09/30/2024), COVID-19 virus infection (09/30/2024), Endometriosis, History of atrial fibrillation, History of COVID-19, respiratory failure, Hyperlipidemia, Hypertension, PONV (postoperative nausea and vomiting), PVC (premature ventricular contraction), Stroke (HCC), and Urinary incontinence., 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 She has no prior history of seizures but states that over the past 3-4 months, she has had a few blackout spells of unknown cause, all of which were unwitnessed. With the seizure that brought her in, she struck the back of her head on the floor. There was no incontinence, or tongue-bite.  She denies EtOH or benzodiazepine withdrawal. No recent illnesses or new medications.   After admission, she had a second seizure overnight. This lasted for around 2 minutes and as per the nurse patient had tonic-clonic activity with loss of consciousness and also patient bit her tongue. Patient appeared confused for about a minute or 2 after the episode. Was given 1 mg IV Ativan . Neurology was called to evaluate.    ROS  Comprehensive ROS performed and pertinent positives documented in HPI    Past History   Past Medical History:  Diagnosis Date   Acute respiratory  failure with hypoxia (HCC) 01/16/2019   Adenomatous polyps    Adjustment disorder with depressed mood 07/01/2017   Altered mental status, unspecified 04/10/2020   Anemia    Anxiety    Asthma    Blood transfusion without reported diagnosis 01/1999   following hysterectomy   Bulging discs    Closed fracture of fourth metatarsal bone of right foot 09/30/2024   COVID-19 virus infection 09/30/2024   Endometriosis    History of atrial fibrillation    History of COVID-19    12/2018, 10/2020   Hx of respiratory failure    Hyperlipidemia    Hypertension    PONV (postoperative nausea and vomiting)    PVC (premature ventricular contraction)    Stroke [facility])    Urinary incontinence     Past Surgical History:  Procedure Laterality Date   5 back surgeries     ABDOMINAL HYSTERECTOMY  2001   TAH/RSO   AUGMENTATION MAMMAPLASTY Bilateral 2013   saline. pre pectoral   BLADDER SUSPENSION  2003   LSO and rectocele repair at [facility]   BREAST SURGERY     breast augmentation--saline implants   EPIDURAL BLOCK INJECTION     HERNIA REPAIR     I & D EXTREMITY Right 06/10/2021   Procedure: IRRIGATION AND DEBRIDEMENT ACHILLES;  Surgeon: Josefina Chew, MD;  Location: [facility] OR;  Service: Orthopedics;  Laterality: Right;   LUMBAR FUSION  2010   Dr. Gaither   REDUCTION MAMMAPLASTY Bilateral    REPLACEMENT TOTAL HIP W/  RESURFACING IMPLANTS  10/2021   SPINAL FUSION  2018   Dr. Colon   SPINAL FUSION  04/06/2020   SPINE SURGERY      Family History: Family History  Problem Relation Age  of Onset   Hypertension Mother    Stroke Mother    Hypertension Father    Cancer Father    Dementia Father    Hyperlipidemia Sister    Hypertension Sister    Stroke Maternal Aunt    Stroke Maternal Uncle    Colon cancer Maternal Uncle 72   Colon cancer Maternal Grandmother 55   Breast cancer Maternal Grandmother 39 - 59   Stroke Paternal Grandfather    Breast cancer Cousin 39   Hyperlipidemia Brother     Hypertension Brother    Hypertension Brother    Hypertension Brother     Social History  reports that she has quit smoking. Her smoking use included cigarettes. She has never used smokeless tobacco. She reports current alcohol use of about 6.0 - 8.0 standard drinks of alcohol per week. She reports that she does not currently use drugs.  Allergies  Allergen Reactions   Ancef  [Cefazolin ] Hives    Cause rash shortness of breath and vomiting    Penicillins Hives and Other (See Comments)    Has patient had a PCN reaction causing immediate rash, facial/tongue/throat swelling, SOB or lightheadedness with hypotension: Yes Has patient had a PCN reaction causing severe rash involving mucus membranes or skin necrosis:Yes Has patient had a PCN reaction that required hospitalization:No Has patient had a PCN reaction occurring within the last 10 years: No If all of the above answers are NO, then may proceed with Cephalosporin use.    Ceftin [Cefuroxime] Nausea And Vomiting   Gabapentin  Other (See Comments)    Jittery, anxious    Medications   Current Facility-Administered Medications:    acetaminophen  (TYLENOL ) tablet 650 mg, 650 mg, Oral, Q6H PRN **OR** acetaminophen  (TYLENOL ) suppository 650 mg, 650 mg, Rectal, Q6H PRN, Melvin, Alexander B, MD   ALPRAZolam  (XANAX ) tablet 0.5 mg, 0.5 mg, Oral, Daily PRN, Melvin, Alexander B, MD, 0.5 mg at 11/26/24 0001   amLODipine (NORVASC) tablet 5 mg, 5 mg, Oral, q morning, Melvin, Alexander B, MD   aspirin  EC tablet 81 mg, 81 mg, Oral, Daily, Melvin, Alexander B, MD   enoxaparin  (LOVENOX ) injection 30 mg, 30 mg, Subcutaneous, Q24H, Melvin, Alexander B, MD   ezetimibe  (ZETIA ) tablet 10 mg, 10 mg, Oral, Daily, Melvin, Alexander B, MD   levETIRAcetam (KEPPRA) tablet 250 mg, 250 mg, Oral, BID, Torre Pikus, MD   levETIRAcetam (KEPPRA) undiluted injection 2,000 mg, 2,000 mg, Intravenous, Once, Merrianne Locus, MD   LORazepam  (ATIVAN ) injection 1 mg, 1 mg,  Intravenous, Q5 Min x 2 PRN, Melvin, Alexander B, MD, 1 mg at 11/26/24 0045   losartan  (COZAAR ) tablet 100 mg, 100 mg, Oral, Daily, Melvin, Alexander B, MD   polyethylene glycol (MIRALAX  / GLYCOLAX ) packet 17 g, 17 g, Oral, Daily PRN, Melvin, Alexander B, MD   propranolol  ER (INDERAL  LA) 24 hr capsule 80 mg, 80 mg, Oral, Daily, Melvin, Alexander B, MD   rosuvastatin  (CRESTOR ) tablet 40 mg, 40 mg, Oral, Daily, Melvin, Alexander B, MD   sodium chloride  flush (NS) 0.9 % injection 3 mL, 3 mL, Intravenous, Q12H, Seena Marsa NOVAK, MD, 3 mL at 11/25/24 2151  Vitals   Vitals:   11/25/24 1715 11/25/24 2000 11/25/24 2150 11/25/24 2314  BP: 117/89 (!) 153/86 132/83 126/77  Pulse: 80 83 82 75  Resp: 12 16 18 18   Temp:    98.3 F (36.8 C)  TempSrc:    Oral  SpO2: 100% 100% 100% 100%  Weight:      Height:  Body mass index is 21.27 kg/m.   Physical Exam   Constitutional: Appears well-developed and well-nourished.  Psych: Affect appropriate to situation.  Eyes: No scleral injection.  HENT: No OP obstruction. Tongue bite with left sided tongue swelling and purplish discoloration.  Head: Normocephalic.  Respiratory: Effort normal, non-labored breathing.  Skin: WDI.   Neurologic Examination   Mental Status: Awake, alert and oriented x 5. Good attention. Speech fluent with intact naming and comprehension. No dysarthria.  Cranial Nerves: II: Temporal visual fields intact with no extinction to DSS. PERRL  III,IV, VI: No ptosis. EOMI.  V: Temp sensation equal bilaterally  VII: Smile symmetric VIII: Hearing intact to voice IX,X: No hoarseness XI: Symmetric shoulder shrug. Mild head-titubation tremor. XII: Midline tongue extension Motor: BUE 5/5 proximally and distally BLE 5/5 proximally and distally  No pronator drift.  Sensory: Temp and light touch intact throughout, bilaterally. No extinction to DSS.  Deep Tendon Reflexes: 2+ and symmetric throughout Cerebellar: No ataxia with  FNF bilaterally  Gait: Deferred  Labs/Imaging/Neurodiagnostic studies   CBC:  Recent Labs  Lab 11-29-24 1542  WBC 6.3  HGB 10.5*  HCT 32.0*  MCV 104.9*  PLT 198   Basic Metabolic Panel:  Lab Results  Component Value Date   NA 137 November 29, 2024   K 3.7 11/29/2024   CO2 25 2024/11/29   GLUCOSE 105 (H) 11-29-2024   BUN 17 11-29-24   CREATININE 1.61 (H) 11/29/24   CALCIUM  9.2 Nov 29, 2024   GFRNONAA 36 (L) 11/29/2024   GFRAA 52 (L) 04/09/2020   Lipid Panel:  Lab Results  Component Value Date   LDLCALC 78 09/30/2024   HgbA1c:  Lab Results  Component Value Date   HGBA1C 5.5 01/17/2019   Urine Drug Screen:     Component Value Date/Time   LABOPIA NONE DETECTED 04/09/2020 1343   COCAINSCRNUR NONE DETECTED 04/09/2020 1343   LABBENZ POSITIVE (A) 04/09/2020 1343   AMPHETMU NONE DETECTED 04/09/2020 1343   THCU NONE DETECTED 04/09/2020 1343   LABBARB NONE DETECTED 04/09/2020 1343    Alcohol Level     Component Value Date/Time   ETH <10 10/21/2023 1808   INR  Lab Results  Component Value Date   INR 1.0 10/21/2023   APTT  Lab Results  Component Value Date   APTT 28 10/21/2023     ASSESSMENT  [name] is a 63 y.o. female presenting after a first-time witnessed seizure, followed by a second witnessed seizure after admission.  - Exam reveals left sided tongue bite. No focal neurological deficits.  - CT head: No acute intracranial abnormality.  - CT C-spine: No acute abnormality of the cervical spine related to the fall from seizure. Grade 1 degenerative anterolisthesis at C4-C5 with left uncovertebral spur, resulting in moderate left foraminal narrowing at C5-C6 and moderate bilateral foraminal stenosis at C6-C7. - MRI brain: No acute intracranial abnormality. Right parietal scalp effusion without underlying fracture. - Impression: First-time witnessed seizure in the context of a 3-4 month history of recurrent black-out spells that have occurred while she  was alone. DDx includes new onset epileptic seizures versus convulsive syncope. She denies withdrawing from EtOH or benzodiazepines.   RECOMMENDATIONS  - EEG (ordered) - Loaded with Keppra 2000 mg. Will continue at 250 mg BID due to her renal impairment.  - MRI brain WITHOUT contrast - Inpatient and outpatient seizure precautions.   ______________________________________________________________________    Bonney SHARK, Reshaun Briseno, MD [facility]

## 2024-11-27 ENCOUNTER — Other Ambulatory Visit (HOSPITAL_COMMUNITY): Payer: Self-pay

## 2024-11-27 LAB — HEMOGLOBIN A1C
Hgb A1c MFr Bld: 4.9 % (ref 4.8–5.6)
Mean Plasma Glucose: 94 mg/dL

## 2024-11-27 MED ORDER — LEVETIRACETAM 250 MG PO TABS
250.0000 mg | ORAL_TABLET | Freq: Two times a day (BID) | ORAL | 0 refills | Status: DC
  Filled 2024-11-27: qty 60, 30d supply, fill #0

## 2024-11-27 NOTE — Hospital Course (Signed)
 Brief Narrative:   63 y.o. female with PMH significant for HTN, HLD, h/o A-fib, COPD, CKD 3, syncope, GERD, fatty liver, lumbar stenosis anxiety, depression. Most recently hospitalized 10/7 to 10/8 for syncopal episode, suspected to be secondary to dehydration and COVID. 12/3, patient was brought to the ED after having a witnessed generalized tonic-clonic seizure at work.  She was standing at the time, fell backwards with seizure and continued to have activity for about a minute.  Reportedly hit her head.  Was in postictal state on arrival but mental status improved in the ED. Has no prior history of seizure. But reports that she has been feeling off for the last 8 weeks and the worse last 2 weeks.  She had a fall about 8 weeks ago and hurt her ankle, does not remember the circumstances around the fall. For the past 2 weeks she has been having issues with her memory and forgetting what she is going to do frequently.  She reports she has become very concerning for her including concerning for her and she is worrying that it could progress to the point that she could make a mistake at work.   In the ED, patient was afebrile, heart rate in 80s, blood pressure in 120s Initial labs with WBC count 6.3, hemoglobin 10.5, BUN/creatinine 17/1.61, AST, ALT elevated, troponin normal CT head and CT C-spine did not show any acute abnormality.   MRI brain without acute intracranial abnormality EEG was unremarkable.  Neurology recommended Keppra  250 mg twice daily and follow-up outpatient.  Assessment & Plan:  New onset generalized tonic-clonic seizures No acute findings on the CT head and MRI brain.  EEG is also unremarkable.  Neurology recommended Keppra  twice daily Outpatient follow-up with neurology   AKI on CKD 3A Creatinine at baseline less than 1.5.  Resolved now back to baseline  Elevated transaminases H/o hepatic steatosis Chronic alcohol use Counseled to quit using  alcohol  Hypokalemia Repleted as needed Acute anemia Chronic anemia hemoglobin at baseline 10.5.  Follow-up outpatient  Hypertension Resume home meds   H/o A-fib Patient reports she had a 1 episode of seizure almost 20 years ago.  Never had recurrence.  She had a Zio patch placed as well.  So far no recurrence of A-fib. Continue to monitor telemetry.   H/o CVA HLD Continue home ASA, rosuvastatin , Zetia  Also on Repatha  outpatient   Depression, Anxiety Continue as needed Xanax , Ambien  as needed at bedtime Discussed with outpatient provider regarding Wellbutrin    Nutrition Status:   Mobility: Encourage ambulation.  Independent at baseline   Goals of care   Code Status: Full Code      Discharge today  Subjective:  Feeling well no complaints  Examination:  General exam: Appears calm and comfortable  Respiratory system: Clear to auscultation. Respiratory effort normal. Cardiovascular system: S1 & S2 heard, RRR. No JVD, murmurs, rubs, gallops or clicks. No pedal edema. Gastrointestinal system: Abdomen is nondistended, soft and nontender. No organomegaly or masses felt. Normal bowel sounds heard. Central nervous system: Alert and oriented. No focal neurological deficits. Extremities: Symmetric 5 x 5 power. Skin: No rashes, lesions or ulcers Psychiatry: Judgement and insight appear normal. Mood & affect appropriate.

## 2024-11-27 NOTE — Discharge Summary (Signed)
 Physician Discharge Summary  [name] FMW:[id number] DOB: 30-Mar-1961 DOA: 11/25/2024  PCP: Windy Coy, MD  Admit date: 11/25/2024 Discharge date: 11/27/2024  Admitted From: Full code Disposition: Full code  Recommendations for Outpatient Follow-up:  Follow up with PCP in 1-2 weeks Please obtain BMP/CBC in one week your next doctors visit.  Keppra  250 mg twice daily.  Outpatient follow-up with neurology next 3-4 weeks Advised not to drive for at least neck 6 months.  Resources have been provided by [facility]   Discharge Condition: Stable CODE STATUS: Full code Diet recommendation: Heart healthy  Brief/Interim Summary: Brief Narrative:   63 y.o. female with PMH significant for HTN, HLD, h/o A-fib, COPD, CKD 3, syncope, GERD, fatty liver, lumbar stenosis anxiety, depression. Most recently hospitalized 10/7 to 10/8 for syncopal episode, suspected to be secondary to dehydration and COVID. 12/3, patient was brought to the ED after having a witnessed generalized tonic-clonic seizure at work.  She was standing at the time, fell backwards with seizure and continued to have activity for about a minute.  Reportedly hit her head.  Was in postictal state on arrival but mental status improved in the ED. Has no prior history of seizure. But reports that she has been feeling off for the last 8 weeks and the worse last 2 weeks.  She had a fall about 8 weeks ago and hurt her ankle, does not remember the circumstances around the fall. For the past 2 weeks she has been having issues with her memory and forgetting what she is going to do frequently.  She reports she has become very concerning for her including concerning for her and she is worrying that it could progress to the point that she could make a mistake at work.   In the ED, patient was afebrile, heart rate in 80s, blood pressure in 120s Initial labs with WBC count 6.3, hemoglobin 10.5, BUN/creatinine 17/1.61, AST, ALT elevated, troponin  normal CT head and CT C-spine did not show any acute abnormality.   MRI brain without acute intracranial abnormality EEG was unremarkable.  Neurology recommended Keppra  250 mg twice daily and follow-up outpatient.  Assessment & Plan:  New onset generalized tonic-clonic seizures No acute findings on the CT head and MRI brain.  EEG is also unremarkable.  Neurology recommended Keppra  twice daily Outpatient follow-up with neurology   AKI on CKD 3A Creatinine at baseline less than 1.5.  Resolved now back to baseline  Elevated transaminases H/o hepatic steatosis Chronic alcohol use Counseled to quit using alcohol  Hypokalemia Repleted as needed Acute anemia Chronic anemia hemoglobin at baseline 10.5.  Follow-up outpatient  Hypertension Resume home meds   H/o A-fib Patient reports she had a 1 episode of seizure almost 20 years ago.  Never had recurrence.  She had a Zio patch placed as well.  So far no recurrence of A-fib. Continue to monitor telemetry.   H/o CVA HLD Continue home ASA, rosuvastatin , Zetia  Also on Repatha  outpatient   Depression, Anxiety Continue as needed Xanax , Ambien  as needed at bedtime Discussed with outpatient provider regarding Wellbutrin    Nutrition Status:   Mobility: Encourage ambulation.  Independent at baseline   Goals of care   Code Status: Full Code      Discharge today  Subjective:  Feeling well no complaints  Examination:  General exam: Appears calm and comfortable  Respiratory system: Clear to auscultation. Respiratory effort normal. Cardiovascular system: S1 & S2 heard, RRR. No JVD, murmurs, rubs, gallops or clicks. No pedal  edema. Gastrointestinal system: Abdomen is nondistended, soft and nontender. No organomegaly or masses felt. Normal bowel sounds heard. Central nervous system: Alert and oriented. No focal neurological deficits. Extremities: Symmetric 5 x 5 power. Skin: No rashes, lesions or ulcers Psychiatry: Judgement and  insight appear normal. Mood & affect appropriate.    Discharge Diagnoses:  Principal Problem:   Seizure [facility]) Active Problems:   Essential hypertension   Depression   Hyperlipidemia   Anxiety   CKD stage 3a, GFR 45-59 ml/min (HCC)   Lumbar stenosis   Gastroesophageal reflux disease   Memory deficits      Discharge Exam: Vitals:   11/27/24 0452 11/27/24 0733  BP: 130/74 (!) 123/57  Pulse: 72 79  Resp: 20 18  Temp: 98 F (36.7 C) 98.1 F (36.7 C)  SpO2: 99% 99%   Vitals:   11/26/24 1956 11/26/24 2339 11/27/24 0452 11/27/24 0733  BP: 113/70 (!) 118/59 130/74 (!) 123/57  Pulse: 79 74 72 79  Resp: 20 20 20 18   Temp: 98.7 F (37.1 C) 98.2 F (36.8 C) 98 F (36.7 C) 98.1 F (36.7 C)  TempSrc: Oral Oral Oral   SpO2: 98% 98% 99% 99%  Weight:      Height:          Discharge Instructions   Allergies as of 11/27/2024       Reactions   Ancef  [cefazolin ] Hives   Cause rash shortness of breath and vomiting    Penicillins Hives, Other (See Comments)   Has patient had a PCN reaction causing immediate rash, facial/tongue/throat swelling, SOB or lightheadedness with hypotension: Yes Has patient had a PCN reaction causing severe rash involving mucus membranes or skin necrosis:Yes Has patient had a PCN reaction that required hospitalization:No Has patient had a PCN reaction occurring within the last 10 years: No If all of the above answers are NO, then may proceed with Cephalosporin use.   Ceftin [cefuroxime] Nausea And Vomiting   Gabapentin  Other (See Comments)   Jittery, anxious        Medication List     TAKE these medications    ALPRAZolam  0.5 MG tablet Commonly known as: XANAX  Take 0.5 mg by mouth daily as needed for anxiety.   amLODipine  5 MG tablet Commonly known as: NORVASC  Take 5 mg by mouth every morning.   aspirin  EC 81 MG tablet Take 1 tablet (81 mg total) by mouth daily. Swallow whole.   buPROPion  300 MG 24 hr tablet Commonly known as:  WELLBUTRIN  XL Take 300 mg by mouth every morning.   ezetimibe  10 MG tablet Commonly known as: Zetia  Take 1 tablet (10 mg total) by mouth daily.   levETIRAcetam  250 MG tablet Commonly known as: KEPPRA  Take 1 tablet (250 mg total) by mouth 2 (two) times daily.   losartan  100 MG tablet Commonly known as: COZAAR  Take 100 mg by mouth daily.   OVER THE COUNTER MEDICATION Take 1 Scoop by mouth 4 (four) times a week. VITAL PROTEINS COLLAGEN PEPTIDE POWDER   propranolol  ER 80 MG 24 hr capsule Commonly known as: INDERAL  LA Take 80 mg by mouth every morning.   Repatha  SureClick 140 MG/ML Soaj Generic drug: Evolocumab  Inject 140 mg into the skin every 14 (fourteen) days.   rosuvastatin  40 MG tablet Commonly known as: CRESTOR  Take 40 mg by mouth daily.   VITAMIN B12 PO Take 1 tablet by mouth daily.   Vitamin D3 25 MCG Caps Take 1 capsule by mouth daily.   zolpidem   5 MG tablet Commonly known as: AMBIEN  Take 5 mg by mouth at bedtime as needed for sleep.        Follow-up Information     Windy Coy, MD Follow up in 1 week(s).   Specialty: Family Medicine Contact information: [address] [place] [address] [phone]         [facility]. Schedule an appointment as soon as possible for a visit in 1 month(s).   Contact information: [address]     [address] [place]  [address] [phone]               Allergies  Allergen Reactions   Ancef  [Cefazolin ] Hives    Cause rash shortness of breath and vomiting    Penicillins Hives and Other (See Comments)    Has patient had a PCN reaction causing immediate rash, facial/tongue/throat swelling, SOB or lightheadedness with hypotension: Yes Has patient had a PCN reaction causing severe rash involving mucus membranes or skin necrosis:Yes Has patient had a PCN reaction that required hospitalization:No Has patient had a PCN reaction occurring within the last 10  years: No If all of the above answers are NO, then may proceed with Cephalosporin use.    Ceftin [Cefuroxime] Nausea And Vomiting   Gabapentin  Other (See Comments)    Jittery, anxious    You were cared for by a hospitalist during your hospital stay. If you have any questions about your discharge medications or the care you received while you were in the hospital after you are discharged, you can call the unit and asked to speak with the hospitalist on call if the hospitalist that took care of you is not available. Once you are discharged, your primary care physician will handle any further medical issues. Please note that no refills for any discharge medications will be authorized once you are discharged, as it is imperative that you return to your primary care physician (or establish a relationship with a primary care physician if you do not have one) for your aftercare needs so that they can reassess your need for medications and monitor your lab values.  You were cared for by a hospitalist during your hospital stay. If you have any questions about your discharge medications or the care you received while you were in the hospital after you are discharged, you can call the unit and asked to speak with the hospitalist on call if the hospitalist that took care of you is not available. Once you are discharged, your primary care physician will handle any further medical issues. Please note that NO REFILLS for any discharge medications will be authorized once you are discharged, as it is imperative that you return to your primary care physician (or establish a relationship with a primary care physician if you do not have one) for your aftercare needs so that they can reassess your need for medications and monitor your lab values.  Please request your Prim.MD to go over all Hospital Tests and Procedure/Radiological results at the follow up, please get all Hospital records sent to your Prim MD by signing  hospital release before you go home.  Get CBC, CMP, 2 view Chest X ray checked  by Primary MD during your next visit or SNF MD in 5-7 days ( we routinely change or add medications that can affect your baseline labs and fluid status, therefore we recommend that you get the mentioned basic workup next visit with your PCP, your PCP may decide not  to get them or add new tests based on their clinical decision)  On your next visit with your primary care physician please Get Medicines reviewed and adjusted.  If you experience worsening of your admission symptoms, develop shortness of breath, life threatening emergency, suicidal or homicidal thoughts you must seek medical attention immediately by calling 911 or calling your MD immediately  if symptoms less severe.  You Must read complete instructions/literature along with all the possible adverse reactions/side effects for all the Medicines you take and that have been prescribed to you. Take any new Medicines after you have completely understood and accpet all the possible adverse reactions/side effects.   Do not drive, operate heavy machinery, perform activities at heights, swimming or participation in water  activities or provide baby sitting services if your were admitted for syncope or siezures until you have seen by Primary MD or a Neurologist and advised to do so again.  Do not drive when taking Pain medications.   Procedures/Studies: EEG adult Result Date: 11/26/2024 Shelton Arlin KIDD, MD     11/26/2024  4:26 PM [name] MRN: [id number] Epilepsy Attending: Arlin KIDD Shelton Referring Physician/Provider: Franky Redia SAILOR, MD Date: 11/26/2024 Duration: Patient history: 63 y.o. female presenting after a first-time witnessed seizure, followed by a second witnessed seizure after admission. EEG to evaluate for seizure Level of alertness: Awake AEDs during EEG study: Xanax  Technical aspects: This EEG study was done with scalp  electrodes positioned according to the 10-20 International system of electrode placement. Electrical activity was reviewed with band pass filter of 1-70Hz , sensitivity of 7 uV/mm, display speed of 67mm/sec with a 60Hz  notched filter applied as appropriate. EEG data were recorded continuously and digitally stored.  Video monitoring was available and reviewed as appropriate. Description: The posterior dominant rhythm consists of 10 Hz activity of moderate voltage (25-35 uV) seen predominantly in posterior head regions, symmetric and reactive to eye opening and eye closing. There is an excessive amount of 15 to 18 Hz beta activity distributed symmetrically and diffusely. Hyperventilation did not show any EEG change.  Physiologic photic driving was seen during photic stimulation.  ABNORMALITY - Excessive beta, generalized IMPRESSION: This study is within normal limits. The excessive beta activity seen in the background is most likely due to the effect of benzodiazepine and is a benign EEG pattern. No seizures or epileptiform discharges were seen throughout the recording. A normal interictal EEG does not exclude the diagnosis of epilepsy. Priyanka KIDD Shelton   CT MAXILLOFACIAL WO CONTRAST Result Date: 11/26/2024 CLINICAL DATA:  Seizure with fall.  Head injury. EXAM: CT MAXILLOFACIAL WITHOUT CONTRAST TECHNIQUE: Multidetector CT imaging of the maxillofacial structures was performed. Multiplanar CT image reconstructions were also generated. RADIATION DOSE REDUCTION: This exam was performed according to the departmental dose-optimization program which includes automated exposure control, adjustment of the mA and/or kV according to patient size and/or use of iterative reconstruction technique. COMPARISON:  None Available. FINDINGS: Osseous: No fracture or mandibular dislocation. No destructive process. Orbits: Negative. No traumatic or inflammatory finding. Sinuses: Clear. Minimal deviation of the nasal septum to the right.  Concha bullosa of the left middle turbinate. Ostiomeatal complexes are patent. Soft tissues: Negative. Limited intracranial: No significant or unexpected finding. IMPRESSION: No acute facial bone fracture. Electronically Signed   By: Toribio Agreste M.D.   On: 11/26/2024 09:36   MR BRAIN W WO CONTRAST Result Date: 11/25/2024 EXAM: MRI BRAIN WITH AND WITHOUT CONTRAST 11/25/2024 09:07:14 PM TECHNIQUE: Multiplanar multisequence MRI of the  head/brain was performed with and without the administration of 5.5 mL of gadobutrol  (GADAVIST ) 1 MMOL/ML intravenous contrast. COMPARISON: CT head without contrast 11/25/2024. CLINICAL HISTORY: FINDINGS: BRAIN AND VENTRICLES: No acute infarct. No acute intracranial hemorrhage. No mass effect or midline shift. No hydrocephalus. The sella is unremarkable. Normal flow voids. No mass or abnormal enhancement. Dedicated imaging of the temporal lobes demonstrates symmetric size and signal of the hippocampal structures. ORBITS: No acute abnormality. SINUSES: No acute abnormality. BONES AND SOFT TISSUES: Normal bone marrow signal and enhancement. A right parietal scalp effusion is present. No underlying fracture is present. IMPRESSION: 1. No acute intracranial abnormality. 2. Right parietal scalp effusion without underlying fracture. Electronically signed by: Lonni Necessary MD 11/25/2024 09:41 PM EST RP Workstation: [id number]   CT Cervical Spine Wo Contrast Result Date: 11/25/2024 EXAM: CT CERVICAL SPINE WITHOUT CONTRAST 11/25/2024 04:25:47 PM TECHNIQUE: CT of the cervical spine was performed without the administration of intravenous contrast. Multiplanar reformatted images are provided for review. Automated exposure control, iterative reconstruction, and/or weight based adjustment of the mA/kV was utilized to reduce the radiation dose to as low as reasonably achievable. COMPARISON: None available. CLINICAL HISTORY: Fall related to seizure. FINDINGS: CERVICAL SPINE: BONES AND  ALIGNMENT: No acute fracture or traumatic malalignment. Grade 1 degenerative anterolisthesis is present at C4-C5. DEGENERATIVE CHANGES: Left uncovertebral spur and moderate left foraminal narrowing at C5-C6. Moderate bilateral foraminal stenosis at C6-C7. SOFT TISSUES: No prevertebral soft tissue swelling. Atherosclerotic calcifications are present at the carotid bifurcations bilaterally without definite stenosis. IMPRESSION: 1. No acute abnormality of the cervical spine related to the fall from seizure. 2. Grade 1 degenerative anterolisthesis at C4-C5 with left uncovertebral spur, resulting in moderate left foraminal narrowing at C5-C6 and moderate bilateral foraminal stenosis at C6-C7. Electronically signed by: Lonni Necessary MD 11/25/2024 04:41 PM EST RP Workstation: [id number]   CT Head Wo Contrast Result Date: 11/25/2024 EXAM: CT HEAD WITHOUT CONTRAST 11/25/2024 04:25:47 PM TECHNIQUE: CT of the head was performed without the administration of intravenous contrast. Automated exposure control, iterative reconstruction, and/or weight based adjustment of the mA/kV was utilized to reduce the radiation dose to as low as reasonably achievable. COMPARISON: MR head without contrast 09/29/2024 and CT head without contrast 09/29/2024. CLINICAL HISTORY: Seizure, new-onset, no history of trauma. FINDINGS: BRAIN AND VENTRICLES: No acute hemorrhage. No evidence of acute infarct. No hydrocephalus. No extra-axial collection. No mass effect or midline shift. ORBITS: No acute abnormality. SINUSES: No acute abnormality. SOFT TISSUES AND SKULL: No acute soft tissue abnormality. No skull fracture. IMPRESSION: 1. No acute intracranial abnormality. Electronically signed by: Lonni Necessary MD 11/25/2024 04:38 PM EST RP Workstation: [id number]     The results of significant diagnostics from this hospitalization (including imaging, microbiology, ancillary and laboratory) are listed below for reference.      Microbiology: No results found for this or any previous visit (from the past 240 hours).   Labs: BNP (last 3 results) No results for input(s): BNP in the last 8760 hours. Basic Metabolic Panel: Recent Labs  Lab 11/25/24 1542 11/26/24 0443  NA 137 140  K 3.7 3.4*  CL 96* 99  CO2 25 28  GLUCOSE 105* 74  BUN 17 14  CREATININE 1.61* 1.59*  CALCIUM  9.2 9.3  MG 2.0  --    Liver Function Tests: Recent Labs  Lab 11/25/24 1542 11/26/24 0443  AST 85* 49*  ALT 158* 116*  ALKPHOS 82 64  BILITOT 1.5* 0.9  PROT 6.6 5.9*  ALBUMIN 3.7 3.2*  No results for input(s): LIPASE, AMYLASE in the last 168 hours. No results for input(s): AMMONIA in the last 168 hours. CBC: Recent Labs  Lab 11/25/24 1542 11/26/24 0443  WBC 6.3 6.0  HGB 10.5* 9.7*  HCT 32.0* 28.9*  MCV 104.9* 105.1*  PLT 198 181   Cardiac Enzymes: No results for input(s): CKTOTAL, CKMB, CKMBINDEX, TROPONINI in the last 168 hours. BNP: Invalid input(s): POCBNP CBG: Recent Labs  Lab 11/25/24 1601  GLUCAP 96   D-Dimer No results for input(s): DDIMER in the last 72 hours. Hgb A1c Recent Labs    11/26/24 0443  HGBA1C 4.9   Lipid Profile No results for input(s): CHOL, HDL, LDLCALC, TRIG, CHOLHDL, LDLDIRECT in the last 72 hours. Thyroid  function studies No results for input(s): TSH, T4TOTAL, T3FREE, THYROIDAB in the last 72 hours.  Invalid input(s): FREET3 Anemia work up Recent Labs    11/26/24 0443  VITAMINB12 492  FOLATE >20.0  FERRITIN 171  TIBC 276  IRON 33  RETICCTPCT 2.0   Urinalysis    Component Value Date/Time   COLORURINE YELLOW 10/15/2024 1050   APPEARANCEUR HAZY (A) 10/15/2024 1050   LABSPEC 1.022 10/15/2024 1050   PHURINE 5.5 10/15/2024 1050   GLUCOSEU NEGATIVE 10/15/2024 1050   HGBUR SMALL (A) 10/15/2024 1050   BILIRUBINUR NEGATIVE 10/15/2024 1050   BILIRUBINUR N 04/27/2013 0944   KETONESUR 15 (A) 10/15/2024 1050   PROTEINUR 100  (A) 10/15/2024 1050   UROBILINOGEN negative 04/27/2013 0944   UROBILINOGEN 0.2 09/23/2008 1004   NITRITE NEGATIVE 10/15/2024 1050   LEUKOCYTESUR NEGATIVE 10/15/2024 1050   Sepsis Labs Recent Labs  Lab 11/25/24 1542 11/26/24 0443  WBC 6.3 6.0   Microbiology No results found for this or any previous visit (from the past 240 hours).   Time coordinating discharge:  I have spent 35 minutes face to face with the patient and on the ward discussing the patients care, assessment, plan and disposition with other care givers. >50% of the time was devoted counseling the patient about the risks and benefits of treatment/Discharge disposition and coordinating care.   SIGNED:   Burgess JAYSON Dare, MD  [facility] 11/27/2024, 12:43 PM   If 7PM-7AM, please contact night-coverage

## 2024-11-27 NOTE — Progress Notes (Signed)
 Transition of Care [facility]) - Inpatient Brief Assessment   Patient Details  Name: [name] MRN: [id number] Date of Birth: Nov 27, 1961  [facility] of [facility]) CM/SW Contact:    Rosaline JONELLE Joe, RN Phone Number: 11/27/2024, 10:52 AM   Clinical Narrative: CM met with the patient to discuss resources prior to discharge.  The patient states that she lives alone and plans to return home today.  Patient states that she drinks wine occasionally socially but plans to stop alcohol use now that she has had a seizure.  Patient was provided with transportation resources at the bedside since patient will be unable to drive for 6 months.  Patient states that she has coordinated transportation to home today with a friend.  Bedside nursing will provide discharge instructions for home.   Transition of Care Asessment: Insurance and Status: (P) Insurance coverage has been reviewed Patient has primary care physician: (P) Yes Home environment has been reviewed: (P) from home alone Prior level of function:: (P) self Prior/Current Home Services: (P) No current home services Social Drivers of Health Review: (P) SDOH reviewed interventions complete Readmission risk has been reviewed: (P) Yes Transition of care needs: (P) transition of care needs identified, TOC will continue to follow

## 2024-11-27 NOTE — Plan of Care (Signed)
  Problem: Education: Goal: Expressions of having a comfortable level of knowledge regarding the disease process will increase Outcome: Progressing   Problem: Coping: Goal: Ability to adjust to condition or change in health will improve Outcome: Progressing Goal: Ability to identify appropriate support needs will improve Outcome: Progressing   Problem: Health Behavior/Discharge Planning: Goal: Compliance with prescribed medication regimen will improve Outcome: Progressing   Problem: Medication: Goal: Risk for medication side effects will decrease Outcome: Progressing   Problem: Clinical Measurements: Goal: Complications related to the disease process, condition or treatment will be avoided or minimized Outcome: Progressing Goal: Diagnostic test results will improve Outcome: Progressing   Problem: Safety: Goal: Verbalization of understanding the information provided will improve Outcome: Progressing   Problem: Self-Concept: Goal: Level of anxiety will decrease Outcome: Progressing Goal: Ability to verbalize feelings about condition will improve Outcome: Progressing   Problem: Education: Goal: Knowledge of General Education information will improve Description: Including pain rating scale, medication(s)/side effects and non-pharmacologic comfort measures Outcome: Progressing   Problem: Health Behavior/Discharge Planning: Goal: Ability to manage health-related needs will improve Outcome: Progressing   Problem: Clinical Measurements: Goal: Ability to maintain clinical measurements within normal limits will improve Outcome: Progressing Goal: Will remain free from infection Outcome: Progressing Goal: Diagnostic test results will improve Outcome: Progressing Goal: Respiratory complications will improve Outcome: Progressing Goal: Cardiovascular complication will be avoided Outcome: Progressing   Problem: Activity: Goal: Risk for activity intolerance will  decrease Outcome: Progressing   Problem: Nutrition: Goal: Adequate nutrition will be maintained Outcome: Progressing   Problem: Coping: Goal: Level of anxiety will decrease Outcome: Progressing   Problem: Elimination: Goal: Will not experience complications related to bowel motility Outcome: Progressing Goal: Will not experience complications related to urinary retention Outcome: Progressing   Problem: Pain Managment: Goal: General experience of comfort will improve and/or be controlled Outcome: Progressing   Problem: Safety: Goal: Ability to remain free from injury will improve Outcome: Progressing   Problem: Skin Integrity: Goal: Risk for impaired skin integrity will decrease Outcome: Progressing

## 2024-12-01 ENCOUNTER — Encounter: Payer: Self-pay | Admitting: Neurology

## 2024-12-01 ENCOUNTER — Ambulatory Visit: Admitting: Neurology

## 2024-12-01 VITALS — BP 130/72 | HR 72 | Ht 63.0 in | Wt 121.5 lb

## 2024-12-01 DIAGNOSIS — R1013 Epigastric pain: Secondary | ICD-10-CM | POA: Insufficient documentation

## 2024-12-01 DIAGNOSIS — G40909 Epilepsy, unspecified, not intractable, without status epilepticus: Secondary | ICD-10-CM

## 2024-12-01 DIAGNOSIS — S32009K Unspecified fracture of unspecified lumbar vertebra, subsequent encounter for fracture with nonunion: Secondary | ICD-10-CM | POA: Insufficient documentation

## 2024-12-01 MED ORDER — LEVETIRACETAM 250 MG PO TABS: 250.0000 mg | ORAL_TABLET | Freq: Two times a day (BID) | ORAL | 0 refills | Status: AC

## 2024-12-01 NOTE — Progress Notes (Signed)
 [facility]  PATIENT: [name] DOB: 06-12-1961  REQUESTING CLINICIAN: Windy Coy, MD HISTORY FROM: Patient  REASON FOR VISIT: Seizure    HISTORICAL  CHIEF COMPLAINT:  Chief Complaint  Patient presents with   New Patient (Initial Visit)    Pt in room 12. Alone. ED referral for seizure-like activity.    HISTORY OF PRESENT ILLNESS:  Discussed the use of AI scribe software for clinical note transcription with the patient, who gave verbal consent to proceed.  [name] is a 63 year old female with hypertension, hyperlipidemia, insomnia, depression who is presenting after recent seizure described as generalized convulsion.    She experienced a seizure at work last week, which she does not remember. Prior to the event, she felt 'a little off' and had difficulty remembering the code to the kitchen.  She was taken to the hospital where she had another seizure that night in the hospital, which she also does not recall. During the seizure, she bit her tongue severely, causing difficulty in talking and eating.  Before the work incident, she had a possible seizure at home while recovering from COVID-19 last month. She got up from the couch, fell, hit her head, and broke her toe. She does not remember the fall but recalls waking up on the floor. Her head remains tender from the impact.  She is currently on Keppra  250 mg twice daily, which makes her feel sluggish and low on energy. She has a history of a stroke in 2019 and is in stage 3 kidney disease. She has been taking Wellbutrin  300 mg for years for anxiety and depression but has recently stopped due to procedures.  She consumes alcohol, typically a glass of wine nightly, and occasionally more when socializing. She has not had alcohol since December 2nd. She acknowledges that stress, alcohol, and Wellbutrin  may have contributed to her seizures.  She lives alone and is concerned about having seizures  without anyone present. Her family lives in different states, with the closest being in [place].  She is scheduled for hip replacement surgery on December 30th and is concerned about her ability to manage post-surgery. She has arrangements for her daughter and a friend to assist her post-operatively.  She is currently off work as a marine scientist and is considering when to return, expressing anxiety about her readiness to resume duties. She is also concerned about her memory, noting increased forgetfulness over the past few months.    OTHER MEDICAL CONDITIONS: Hypertension, hyperlipidemia, insomnia, depression,   REVIEW OF SYSTEMS: Full 14 system review of systems performed and negative with exception of: As noted in the HPI  ALLERGIES: Allergies  Allergen Reactions   Ancef  [Cefazolin ] Hives    Cause rash shortness of breath and vomiting    Penicillins Hives and Other (See Comments)    Has patient had a PCN reaction causing immediate rash, facial/tongue/throat swelling, SOB or lightheadedness with hypotension: Yes Has patient had a PCN reaction causing severe rash involving mucus membranes or skin necrosis:Yes Has patient had a PCN reaction that required hospitalization:No Has patient had a PCN reaction occurring within the last 10 years: No If all of the above answers are NO, then may proceed with Cephalosporin use.    Ceftin [Cefuroxime] Nausea And Vomiting   Gabapentin  Other (See Comments)    Jittery, anxious    HOME MEDICATIONS: Outpatient Medications Prior to Visit  Medication Sig Dispense Refill   ALPRAZolam  (XANAX ) 0.5 MG tablet Take 0.5  mg by mouth daily as needed for anxiety.  1   amLODipine  (NORVASC ) 5 MG tablet Take 5 mg by mouth every morning.     aspirin  EC 81 MG tablet Take 1 tablet (81 mg total) by mouth daily. Swallow whole.     Cholecalciferol (VITAMIN D3) 25 MCG CAPS Take 1 capsule by mouth daily.     Cyanocobalamin (VITAMIN B12 PO) Take 1 tablet by  mouth daily.     Evolocumab  (REPATHA  SURECLICK) 140 MG/ML SOAJ Inject 140 mg into the skin every 14 (fourteen) days. 6 mL 3   ezetimibe  (ZETIA ) 10 MG tablet Take 1 tablet (10 mg total) by mouth daily. 30 tablet 4   losartan  (COZAAR ) 100 MG tablet Take 100 mg by mouth daily.     losartan  (COZAAR ) 50 MG tablet Take 50 mg by mouth daily.     OVER THE COUNTER MEDICATION Take 1 Scoop by mouth 4 (four) times a week. VITAL PROTEINS COLLAGEN PEPTIDE POWDER     propranolol  ER (INDERAL  LA) 80 MG 24 hr capsule Take 80 mg by mouth every morning.     rosuvastatin  (CRESTOR ) 40 MG tablet Take 40 mg by mouth daily.     zolpidem  (AMBIEN ) 5 MG tablet Take 5 mg by mouth at bedtime as needed for sleep.     levETIRAcetam  (KEPPRA ) 250 MG tablet Take 1 tablet (250 mg total) by mouth 2 (two) times daily. 180 tablet 0   buPROPion  (WELLBUTRIN  XL) 300 MG 24 hr tablet Take 300 mg by mouth every morning.     No facility-administered medications prior to visit.    PAST MEDICAL HISTORY: Past Medical History:  Diagnosis Date   Acute respiratory failure with hypoxia (HCC) 01/16/2019   Adenomatous polyps    Adjustment disorder with depressed mood 07/01/2017   Altered mental status, unspecified 04/10/2020   Anemia    Anxiety    Asthma    Blood transfusion without reported diagnosis 01/1999   following hysterectomy   Bulging discs    Closed fracture of fourth metatarsal bone of right foot 09/30/2024   COVID-19 virus infection 09/30/2024   Endometriosis    History of atrial fibrillation    History of COVID-19    12/2018, 10/2020   Hx of respiratory failure    Hyperlipidemia    Hypertension    PONV (postoperative nausea and vomiting)    PVC (premature ventricular contraction)    Stroke (HCC)    Urinary incontinence     PAST SURGICAL HISTORY: Past Surgical History:  Procedure Laterality Date   5 back surgeries     ABDOMINAL HYSTERECTOMY  2001   TAH/RSO   AUGMENTATION MAMMAPLASTY Bilateral 2013   saline.  pre pectoral   BLADDER SUSPENSION  2003   [facility]O and rectocele repair at [facility]   BREAST SURGERY     breast augmentation--saline implants   EPIDURAL BLOCK INJECTION     HERNIA REPAIR     I & D EXTREMITY Right 06/10/2021   Procedure: IRRIGATION AND DEBRIDEMENT ACHILLES;  Surgeon: Josefina Chew, MD;  Location: [facility] OR;  Service: Orthopedics;  Laterality: Right;   LUMBAR FUSION  2010   Dr. Gaither   REDUCTION MAMMAPLASTY Bilateral    REPLACEMENT TOTAL HIP W/  RESURFACING IMPLANTS  10/2021   SPINAL FUSION  2018   Dr. Colon   SPINAL FUSION  04/06/2020   SPINE SURGERY      FAMILY HISTORY: Family History  Problem Relation Age of Onset   Hypertension Mother  Stroke Mother    Hypertension Father    Cancer Father    Dementia Father    Hyperlipidemia Sister    Hypertension Sister    Stroke Maternal Aunt    Stroke Maternal Uncle    Colon cancer Maternal Uncle 72   Colon cancer Maternal Grandmother 61   Breast cancer Maternal Grandmother 28 - 59   Stroke Paternal Grandfather    Breast cancer Cousin 70   Hyperlipidemia Brother    Hypertension Brother    Hypertension Brother    Hypertension Brother     SOCIAL HISTORY: Social History   Socioeconomic History   Marital status: Divorced    Spouse name: Not on file   Number of children: Not on file   Years of education: Not on file   Highest education level: Not on file  Occupational History   Not on file  Tobacco Use   Smoking status: Former    Types: Cigarettes   Smokeless tobacco: Never  Vaping Use   Vaping status: Never Used  Substance and Sexual Activity   Alcohol use: Yes    Alcohol/week: 6.0 - 8.0 standard drinks of alcohol    Types: 6 - 8 Standard drinks or equivalent per week   Drug use: Not Currently   Sexual activity: Yes    Partners: Male    Birth control/protection: Surgical    Comment: TAH  Other Topics Concern   Not on file  Social History Narrative   Not on file   Social Drivers of Health   Financial  Resource Strain: Not on file  Food Insecurity: Patient Declined (11/26/2024)   Hunger Vital Sign    Worried About Running Out of Food in the Last Year: Patient declined    Ran Out of Food in the Last Year: Patient declined  Transportation Needs: Patient Declined (11/26/2024)   PRAPARE - Administrator, Civil Service (Medical): Patient declined    Lack of Transportation (Non-Medical): Patient declined  Physical Activity: Not on file  Stress: Not on file  Social Connections: Not on file  Intimate Partner Violence: Patient Declined (11/26/2024)   Humiliation, Afraid, Rape, and Kick questionnaire    Fear of Current or Ex-Partner: Patient declined    Emotionally Abused: Patient declined    Physically Abused: Patient declined    Sexually Abused: Patient declined     PHYSICAL EXAM  GENERAL EXAM/CONSTITUTIONAL: Vitals:  Vitals:   12/01/24 1401  BP: 130/72  Pulse: 72  Weight: 121 lb 8 oz (55.1 kg)  Height: 5' 3 (1.6 m)   Body mass index is 21.52 kg/m. Wt Readings from Last 3 Encounters:  12/01/24 121 lb 8 oz (55.1 kg)  11/25/24 123 lb 14.4 oz (56.2 kg)  11/17/24 124 lb (56.2 kg)   Patient is in no distress; well developed, nourished and groomed; neck is supple.  She does have a left lateral tongue biting  MUSCULOSKELETAL: Gait, strength, tone, movements noted in Neurologic exam below  NEUROLOGIC: MENTAL STATUS:      No data to display         awake, alert, oriented to person, place and time recent and remote memory intact normal attention and concentration language fluent, comprehension intact, naming intact fund of knowledge appropriate  CRANIAL NERVE:  2nd, 3rd, 4th, 6th - Visual fields full to confrontation, extraocular muscles intact, no nystagmus 5th - facial sensation symmetric 7th - facial strength symmetric 8th - hearing intact 9th - palate elevates symmetrically, uvula midline 11th -  shoulder shrug symmetric 12th - tongue protrusion  midline  MOTOR:  normal bulk and tone, full strength in the BUE, BLE  SENSORY:  normal and symmetric to light touch  COORDINATION:  finger-nose-finger, fine finger movements normal  GAIT/STATION:  normal    DIAGNOSTIC DATA (LABS, IMAGING, TESTING) - I reviewed patient records, labs, notes, testing and imaging myself where available.  Lab Results  Component Value Date   WBC 6.0 11/26/2024   HGB 9.7 (L) 11/26/2024   HCT 28.9 (L) 11/26/2024   MCV 105.1 (H) 11/26/2024   PLT 181 11/26/2024      Component Value Date/Time   NA 140 11/26/2024 0443   NA 142 05/24/2021 1631   K 3.4 (L) 11/26/2024 0443   CL 99 11/26/2024 0443   CO2 28 11/26/2024 0443   GLUCOSE 74 11/26/2024 0443   BUN 14 11/26/2024 0443   BUN 34 (H) 05/24/2021 1631   CREATININE 1.59 (H) 11/26/2024 0443   CALCIUM  9.3 11/26/2024 0443   PROT 5.9 (L) 11/26/2024 0443   PROT 6.8 11/05/2024 1119   ALBUMIN 3.2 (L) 11/26/2024 0443   ALBUMIN 4.2 11/05/2024 1119   AST 49 (H) 11/26/2024 0443   ALT 116 (H) 11/26/2024 0443   ALKPHOS 64 11/26/2024 0443   BILITOT 0.9 11/26/2024 0443   BILITOT 0.7 11/05/2024 1119   GFRNONAA 36 (L) 11/26/2024 0443   GFRAA 52 (L) 04/09/2020 1343   Lab Results  Component Value Date   CHOL 166 09/30/2024   HDL 70 09/30/2024   LDLCALC 78 09/30/2024   TRIG 88 09/30/2024   CHOLHDL 2.4 09/30/2024   Lab Results  Component Value Date   HGBA1C 4.9 11/26/2024   Lab Results  Component Value Date   VITAMINB12 492 11/26/2024   Lab Results  Component Value Date   TSH 2.382 02/07/2021    MRI Brain 11/25/2024 1. No acute intracranial abnormality. 2. Right parietal scalp effusion without underlying fracture.  EEG 11/26/2024 - Excessive beta, generalized     ASSESSMENT AND PLAN  63 y.o. year old female with    Seizure disorder Recent onset of seizure disorder with multiple seizures in a short period, including a cluster of seizures in the hospital. Possible provoked seizures due  to alcohol consumption and Wellbutrin  use. EEG and MRI were normal, ruling out structural abnormalities. Differential includes provoked seizures versus epilepsy. Current treatment with Keppra  250 mg twice daily due to chronic kidney disease. Discussed the importance of seizure-free period before considering medication discontinuation. Advised on lifestyle modifications to reduce seizure risk, including alcohol and stress management. Discussed the potential for provoked seizures and the need for further EEG monitoring. Emphasized the importance of monitoring for signs of nocturnal seizures and the need for family support if living alone. - Continue Keppra  250 mg twice daily. - Monitor for signs of nocturnal seizures, such as waking up on the floor, blood on the pillow, or urinary incontinence. - Avoid alcohol consumption to reduce seizure risk. - Discuss alternative medications for anxiety and depression with primary care physician since Wellbutrin  was discontinued. - Consider family support or living arrangements if living alone. - Will repeat EEG with a longer study if seizures persist. - Advised against driving for six months from December 3rd. - Advised to take Xanax  if feeling a seizure is imminent.  Chronic kidney disease stage 3 GFR of 36, influencing the dosing of Keppra  due to renal clearance considerations. - Continue to monitor kidney function and adjust Keppra  dosage as needed based on  renal clearance.  Recent concussion Following a fall with head impact. Persistent headache and tenderness at the site of impact. No alarming findings on MRI, but some cortical thinning noted, which may contribute to seizure risk. - Continue to monitor for resolution of headache and tenderness. - Advised against activities that could exacerbate symptoms, such as driving or using power tools.     1. Seizure disorder [facility])      Patient Instructions  Continue with Keppra  250 mg twice daily Continue  your other medications Will check a Keppra  level today and adjust dose if necessary Please call if you do have another seizure Follow-up in 6 months or sooner if worse. Discussed driving restriction for total of 6 months, patient voiced understanding  Orders Placed This Encounter  Procedures   Levetiracetam  level    Meds ordered this encounter  Medications   levETIRAcetam  (KEPPRA ) 250 MG tablet    Sig: Take 1 tablet (250 mg total) by mouth 2 (two) times daily.    Dispense:  180 tablet    Refill:  0    Return in about 6 months (around 06/01/2025).  I personally spent a total of 70 minutes in the care of the patient today including preparing to see the patient, getting/reviewing separately obtained history, performing a medically appropriate exam/evaluation, counseling and educating, placing orders, documenting clinical information in the EHR, independently interpreting results, communicating results, and discussed her seizures, relation to alcohol use and Wellbutrin .   Pastor Falling, MD 12/01/2024, 9:44 PM  [facility] [address], [address] [place], [place] [address] [phone]

## 2024-12-01 NOTE — Patient Instructions (Signed)
 Continue with Keppra  250 mg twice daily Continue your other medications Will check a Keppra  level today and adjust dose if necessary Please call if you do have another seizure Follow-up in 6 months or sooner if worse. Discussed driving restriction for total of 6 months, patient voiced understanding

## 2024-12-02 LAB — LEVETIRACETAM LEVEL: Levetiracetam Lvl: 15.7 ug/mL (ref 10.0–40.0)

## 2024-12-03 ENCOUNTER — Ambulatory Visit: Payer: Self-pay | Admitting: Neurology

## 2024-12-21 ENCOUNTER — Encounter: Payer: Self-pay | Admitting: Neurology

## 2024-12-22 ENCOUNTER — Encounter: Payer: Self-pay | Admitting: Neurology

## 2025-01-20 NOTE — Telephone Encounter (Signed)
 In response to request from CMA, there is nothing available before current appointment which is already on wait list.

## 2025-01-20 NOTE — Telephone Encounter (Signed)
 Please schedule patient for a follow up visit for headaches and add her to the cancellation list.

## 2025-01-25 ENCOUNTER — Other Ambulatory Visit: Payer: Self-pay | Admitting: Internal Medicine

## 2025-01-25 ENCOUNTER — Encounter (HOSPITAL_BASED_OUTPATIENT_CLINIC_OR_DEPARTMENT_OTHER): Payer: Self-pay | Admitting: Internal Medicine

## 2025-01-27 NOTE — Telephone Encounter (Signed)
 Labs on 09/30/24

## 2025-07-05 ENCOUNTER — Ambulatory Visit: Admitting: Neurology
